# Patient Record
Sex: Male | Born: 1935 | Race: White | Hispanic: No | Marital: Married | Smoking: Former smoker
Health system: Southern US, Community
[De-identification: ages and names within clinical notes are randomized; demographics above are authoritative.]

## PROBLEM LIST (undated history)

## (undated) DIAGNOSIS — C679 Malignant neoplasm of bladder, unspecified: Secondary | ICD-10-CM

## (undated) DIAGNOSIS — R0602 Shortness of breath: Secondary | ICD-10-CM

## (undated) DIAGNOSIS — E785 Hyperlipidemia, unspecified: Secondary | ICD-10-CM

## (undated) DIAGNOSIS — I499 Cardiac arrhythmia, unspecified: Secondary | ICD-10-CM

## (undated) DIAGNOSIS — C4490 Unspecified malignant neoplasm of skin, unspecified: Secondary | ICD-10-CM

## (undated) DIAGNOSIS — Z87442 Personal history of urinary calculi: Secondary | ICD-10-CM

## (undated) HISTORY — DX: Cardiac arrhythmia, unspecified: I49.9

## (undated) HISTORY — PX: OTHER SURGICAL HISTORY: SHX169

## (undated) HISTORY — DX: Shortness of breath: R06.02

## (undated) HISTORY — DX: Hyperlipidemia, unspecified: E78.5

---

## 2000-01-16 ENCOUNTER — Encounter: Payer: Self-pay | Admitting: Emergency Medicine

## 2000-01-16 ENCOUNTER — Emergency Department (HOSPITAL_COMMUNITY): Admission: EM | Admit: 2000-01-16 | Discharge: 2000-01-16 | Payer: Self-pay | Admitting: Emergency Medicine

## 2010-02-23 ENCOUNTER — Ambulatory Visit: Payer: Self-pay | Admitting: Cardiovascular Disease

## 2010-05-26 ENCOUNTER — Ambulatory Visit (INDEPENDENT_AMBULATORY_CARE_PROVIDER_SITE_OTHER): Payer: Medicare Other | Admitting: Cardiovascular Disease

## 2010-05-26 DIAGNOSIS — E7800 Pure hypercholesterolemia, unspecified: Secondary | ICD-10-CM

## 2010-05-26 DIAGNOSIS — I119 Hypertensive heart disease without heart failure: Secondary | ICD-10-CM

## 2010-12-21 ENCOUNTER — Encounter: Payer: Self-pay | Admitting: Cardiovascular Disease

## 2010-12-23 ENCOUNTER — Encounter: Payer: Self-pay | Admitting: Cardiovascular Disease

## 2010-12-23 ENCOUNTER — Ambulatory Visit (INDEPENDENT_AMBULATORY_CARE_PROVIDER_SITE_OTHER): Payer: Medicare Other | Admitting: Cardiovascular Disease

## 2010-12-23 DIAGNOSIS — R000 Tachycardia, unspecified: Secondary | ICD-10-CM

## 2010-12-23 DIAGNOSIS — E785 Hyperlipidemia, unspecified: Secondary | ICD-10-CM | POA: Insufficient documentation

## 2010-12-23 LAB — BASIC METABOLIC PANEL
BUN: 22 mg/dL (ref 6–23)
CO2: 29 mEq/L (ref 19–32)
Calcium: 9.1 mg/dL (ref 8.4–10.5)
Chloride: 106 mEq/L (ref 96–112)
Creatinine, Ser: 1.4 mg/dL (ref 0.4–1.5)
GFR: 53.18 mL/min — ABNORMAL LOW (ref >60.00)
Glucose, Bld: 111 mg/dL — ABNORMAL HIGH (ref 70–99)
Potassium: 5 mEq/L (ref 3.5–5.1)
Sodium: 142 mEq/L (ref 135–145)

## 2010-12-23 LAB — LIPID PANEL
Cholesterol: 176 mg/dL (ref 0–200)
HDL: 39.3 mg/dL (ref >39.00)
LDL Cholesterol: 115 mg/dL — ABNORMAL HIGH (ref 0–99)
Total CHOL/HDL Ratio: 4
Triglycerides: 107 mg/dL (ref 0.0–149.0)
VLDL: 21.4 mg/dL (ref 0.0–40.0)

## 2010-12-23 LAB — HEPATIC FUNCTION PANEL
ALT: 23 U/L (ref 0–53)
AST: 28 U/L (ref 0–37)
Albumin: 4.1 g/dL (ref 3.5–5.2)
Alkaline Phosphatase: 43 U/L (ref 39–117)
Bilirubin, Direct: 0.1 mg/dL (ref 0.0–0.3)
Total Bilirubin: 0.6 mg/dL (ref 0.3–1.2)
Total Protein: 7.3 g/dL (ref 6.0–8.3)

## 2010-12-23 NOTE — Assessment & Plan Note (Signed)
His lipid levels have been fairly well-controlled. We'll check him again at his next office visit.

## 2010-12-23 NOTE — Assessment & Plan Note (Signed)
His heart rate and blood pressure likely very well controlled. I've encouraged him to try to get out and get some regular exercise. I'll see him again in one year.

## 2010-12-23 NOTE — Progress Notes (Signed)
Randall Little Date of Birth  Mar 30, 1933 [facility] [address]    [address] [place], [place]  [address] [phone]  Fax  [phone]  History of Present Illness:  76 year old gentleman with the history tachycardia and history of hyperlipidemia. He's done for well. He still is working every day. His work involves a lot of travel.  He does not get any regular aerobic exercise but does walk quite a bit.[name] He is job involves a lot of walking.  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   He's had a few episodes of shortness breath when he is exercising a lot but has never had episodes of chest pain  Current Outpatient Prescriptions on File Prior to Visit  Medication Sig Dispense Refill  . aspirin 81 MG tablet Take 81 mg by mouth daily.        . fenofibrate (TRICOR) 145 MG tablet Take 145 mg by mouth daily.        . metoprolol succinate (TOPROL-XL) 25 MG 24 hr tablet Take 25 mg by mouth daily.        . Multiple Vitamin (MULTI-VITAMIN PO) Take by mouth daily.        . niacin (NIASPAN) 500 MG CR tablet Take 500 mg by mouth at bedtime.          No Known Allergies  Past Medical History  Diagnosis Date  . Arrhythmia     Tachycardia  . SOB (shortness of breath)   . Dyslipidemia     No past surgical history on file.  History  Smoking status  . Former Smoker  Smokeless tobacco  . Not on file    History  Alcohol Use No    Family History  Problem Relation Age of Onset  . Heart disease Mother   . Cancer Sister   . Cancer Sister     Reviw of Systems:  Reviewed in the HPI.  All other systems are negative.  Physical Exam: BP 146/84  Pulse 78  Ht 5\' 10"  (1.778 m)  Wt 170 lb 6.4 oz (77.293 kg)  BMI 24.45 kg/m2 The patient is alert and oriented x 3.  The mood and affect are normal.   Skin: warm and dry.  Color is normal.    HEENT:   the sclera are nonicteric.  The mucous membranes are moist.  The carotids are 2+ without  bruits.  There is no thyromegaly.  There is no JVD.    Lungs: clear.  The chest wall is non tender.    Heart: regular rate with a normal S1 and S2.  There are no murmurs, gallops, or rubs. The PMI is not displaced.     Abdomen: good bowel sounds.  There is no guarding or rebound.  There is no hepatosplenomegaly or tenderness.  There are no masses.   Extremities:  no clubbing, cyanosis, or edema.  The legs are without rashes.  The distal pulses are intact.   Neuro:  Cranial nerves II - XII are intact.  Motor and sensory functions are intact.    The gait is normal.   Assessment / Plan:

## 2011-01-25 ENCOUNTER — Other Ambulatory Visit: Payer: Self-pay | Admitting: Cardiovascular Disease

## 2011-02-25 ENCOUNTER — Other Ambulatory Visit: Payer: Self-pay | Admitting: Cardiovascular Disease

## 2011-09-01 ENCOUNTER — Other Ambulatory Visit: Payer: Self-pay | Admitting: Cardiovascular Disease

## 2011-09-01 MED ORDER — NIACIN ER (ANTIHYPERLIPIDEMIC) 500 MG PO TBCR: 500.0000 mg | EXTENDED_RELEASE_TABLET | Freq: Every day | ORAL | Status: DC

## 2011-09-27 ENCOUNTER — Other Ambulatory Visit: Payer: Self-pay | Admitting: *Deleted

## 2011-09-27 MED ORDER — METOPROLOL SUCCINATE ER 50 MG PO TB24: 50.0000 mg | ORAL_TABLET | Freq: Every day | ORAL | Status: DC

## 2012-01-03 ENCOUNTER — Other Ambulatory Visit: Payer: Self-pay | Admitting: Cardiology

## 2012-01-10 NOTE — Telephone Encounter (Signed)
Follow-up:    Called to follow up on medication refill request of metoprolol succinate (TOPROL-XL) 50 MG 24 hr tablet.

## 2012-01-13 ENCOUNTER — Other Ambulatory Visit: Payer: Self-pay

## 2012-01-13 MED ORDER — METOPROLOL SUCCINATE ER 50 MG PO TB24: 50.0000 mg | ORAL_TABLET | Freq: Every day | ORAL | Status: DC

## 2012-02-15 ENCOUNTER — Other Ambulatory Visit: Payer: Self-pay | Admitting: *Deleted

## 2012-02-15 MED ORDER — METOPROLOL SUCCINATE ER 50 MG PO TB24: 50.0000 mg | ORAL_TABLET | Freq: Every day | ORAL | Status: DC

## 2012-02-15 NOTE — Telephone Encounter (Signed)
Pt needs appointment then refill can be made Fax Received. Refill Completed. Randall Little (R.M.A)   

## 2012-03-20 ENCOUNTER — Telehealth: Payer: Self-pay | Admitting: Cardiovascular Disease

## 2012-03-20 MED ORDER — METOPROLOL SUCCINATE ER 50 MG PO TB24: 50.0000 mg | ORAL_TABLET | Freq: Every day | ORAL | Status: DC

## 2012-03-20 NOTE — Telephone Encounter (Signed)
plz return call to pt on cell [phone] regarding temp refill until 03/24/12 med refill appnt

## 2012-03-20 NOTE — Telephone Encounter (Signed)
Fax Received. Refill Completed. Randall Little (R.M.A). No Refills until after appointment

## 2012-03-24 ENCOUNTER — Ambulatory Visit (INDEPENDENT_AMBULATORY_CARE_PROVIDER_SITE_OTHER): Payer: Medicare Other | Admitting: Physician Assistant

## 2012-03-24 ENCOUNTER — Encounter: Payer: Self-pay | Admitting: Physician Assistant

## 2012-03-24 VITALS — BP 140/86 | HR 73 | Ht 70.0 in | Wt 168.1 lb

## 2012-03-24 DIAGNOSIS — R000 Tachycardia, unspecified: Secondary | ICD-10-CM

## 2012-03-24 DIAGNOSIS — E785 Hyperlipidemia, unspecified: Secondary | ICD-10-CM

## 2012-03-24 MED ORDER — FENOFIBRATE 145 MG PO TABS: 145.0000 mg | ORAL_TABLET | Freq: Every day | ORAL | Status: DC

## 2012-03-24 NOTE — Progress Notes (Signed)
  [address], [address] [place], [place]  [address] Phone: [phone], Fax:  [phone]  Date:  03/24/2012   Name:  Randall Little   DOB:  December 25, 1932   MRN:  [id number]  PCP:  Kaleen Mask, MD  Primary Cardiologist:  Dr. Delane Ginger  Primary Electrophysiologist:  None    History of Present Illness: Randall Little is a 78 y.o. male who follows with Dr. Delane Ginger for a hx of tachycardia and HL.  No echocardiogram or stress tests found in review of his chart.  He is here for annual follow up.  The patient denies chest pain, shortness of breath, syncope, orthopnea, PND or significant pedal edema.   Labs (9/12):   K 5, creatinine 1.4, ALT 23, LDL 115, TG 107, HDL 39.3    Wt Readings from Last 3 Encounters:  03/24/12 168 lb 1.9 oz (76.259 kg)  12/23/10 170 lb 6.4 oz (77.293 kg)  05/26/10 168 lb 12.8 oz (76.567 kg)     Past Medical History  Diagnosis Date  . Arrhythmia     Tachycardia  . SOB (shortness of breath)   . Dyslipidemia     Current Outpatient Prescriptions  Medication Sig Dispense Refill  . aspirin 81 MG tablet Take 81 mg by mouth daily.        . fenofibrate (TRICOR) 145 MG tablet Take 145 mg by mouth daily.        . metoprolol succinate (TOPROL-XL) 50 MG 24 hr tablet Take 1 tablet (50 mg total) by mouth daily. Take with or immediately following a meal.  30 tablet  0  . Multiple Vitamin (MULTI-VITAMIN PO) Take by mouth daily.        . niacin (NIASPAN) 500 MG CR tablet Take 1 tablet (500 mg total) by mouth at bedtime.  30 tablet  5    Allergies:   No Known Allergies  Social History:  The patient  reports that he has quit smoking. He does not have any smokeless tobacco history on file. He reports that he does not drink alcohol.   ROS:  Please see the history of present illness.   All other systems reviewed and negative.   PHYSICAL EXAM: VS:  BP 140/86  Pulse 73  Ht 5\' 10"  (1.778 m)  Wt 168 lb 1.9 oz (76.259 kg)  BMI 24.12 kg/m2 Well  nourished, well developed, in no acute distress HEENT: normal Neck: no JVD Vascular:  No carotid bruits Cardiac:  normal S1, S2; RRR; no murmur Lungs:  clear to auscultation bilaterally, no wheezing, rhonchi or rales Abd: soft, nontender, no hepatomegaly Ext: no edema Skin: warm and dry Neuro:  CNs 2-12 intact, no focal abnormalities noted  EKG:  NSR, HR 73, normal axis, NSSTTW changes      ASSESSMENT AND PLAN:  1. Tachycardia:   Appears to be controlled.  Continue current Rx.  2. Hyperlipidemia:   Continue Tricor and Niacin.  Arrange follow up Lipids, LFTs, BMET.  3. Disposition:   Follow up with Dr. Delane Ginger in 1 year or sooner PRN.  Signed, Tereso Newcomer, PA-C  9:14 AM 03/24/2012

## 2012-03-24 NOTE — Patient Instructions (Addendum)
Your physician wants you to follow-up in: 55 MONTH. You will receive a reminder letter in the mail two months in advance. If you don't receive a letter, please call our office to schedule the follow-up appointment.   Your physician recommends that you return for lab work in: FASTING LIPID AND LIVER PANEL ALSO A BMET   A REFILL FOR TRICOR WAS SENT TO [facility] [place]

## 2012-04-26 ENCOUNTER — Other Ambulatory Visit: Payer: Self-pay | Admitting: *Deleted

## 2012-04-26 MED ORDER — METOPROLOL SUCCINATE ER 50 MG PO TB24: 50.0000 mg | ORAL_TABLET | Freq: Every day | ORAL | Status: DC

## 2012-04-26 NOTE — Telephone Encounter (Signed)
Fax Received. Refill Completed. Neeti Knudtson Chowoe (R.M.A)   

## 2012-10-09 ENCOUNTER — Other Ambulatory Visit: Payer: Self-pay | Admitting: Family Medicine

## 2012-10-09 DIAGNOSIS — Q000 Anencephaly: Secondary | ICD-10-CM

## 2012-10-12 ENCOUNTER — Ambulatory Visit
Admission: RE | Admit: 2012-10-12 | Discharge: 2012-10-12 | Disposition: A | Discharge status: Home / Self Care | Payer: Medicare Other | Source: Ambulatory Visit | Attending: Family Medicine | Admitting: Family Medicine

## 2012-10-12 DIAGNOSIS — Q000 Anencephaly: Secondary | ICD-10-CM

## 2012-12-20 ENCOUNTER — Other Ambulatory Visit: Payer: Self-pay | Admitting: Cardiovascular Disease

## 2013-05-02 ENCOUNTER — Other Ambulatory Visit: Payer: Self-pay | Admitting: Cardiovascular Disease

## 2013-05-23 ENCOUNTER — Encounter: Payer: Self-pay | Admitting: Cardiovascular Disease

## 2013-05-31 ENCOUNTER — Ambulatory Visit: Payer: Medicare Other | Admitting: Cardiovascular Disease

## 2013-06-05 ENCOUNTER — Other Ambulatory Visit: Payer: Self-pay | Admitting: Cardiovascular Disease

## 2013-06-27 ENCOUNTER — Ambulatory Visit (INDEPENDENT_AMBULATORY_CARE_PROVIDER_SITE_OTHER): Payer: Medicare Other | Admitting: Cardiovascular Disease

## 2013-06-27 ENCOUNTER — Encounter: Payer: Self-pay | Admitting: Cardiovascular Disease

## 2013-06-27 VITALS — BP 130/74 | HR 82 | Ht 70.0 in | Wt 171.1 lb

## 2013-06-27 DIAGNOSIS — Z0000 Encounter for general adult medical examination without abnormal findings: Secondary | ICD-10-CM

## 2013-06-27 DIAGNOSIS — R000 Tachycardia, unspecified: Secondary | ICD-10-CM

## 2013-06-27 DIAGNOSIS — C61 Malignant neoplasm of prostate: Secondary | ICD-10-CM

## 2013-06-27 DIAGNOSIS — E785 Hyperlipidemia, unspecified: Secondary | ICD-10-CM

## 2013-06-27 LAB — LIPID PANEL
Cholesterol: 148 mg/dL (ref 0–200)
HDL: 34 mg/dL — ABNORMAL LOW (ref >39.00)
LDL Cholesterol: 87 mg/dL (ref 0–99)
Total CHOL/HDL Ratio: 4
Triglycerides: 134 mg/dL (ref 0.0–149.0)
VLDL: 26.8 mg/dL (ref 0.0–40.0)

## 2013-06-27 LAB — HEPATIC FUNCTION PANEL
ALT: 25 U/L (ref 0–53)
AST: 25 U/L (ref 0–37)
Albumin: 3.6 g/dL (ref 3.5–5.2)
Alkaline Phosphatase: 69 U/L (ref 39–117)
BILIRUBIN DIRECT: 0.1 mg/dL (ref 0.0–0.3)
TOTAL PROTEIN: 6.5 g/dL (ref 6.0–8.3)
Total Bilirubin: 0.7 mg/dL (ref 0.3–1.2)

## 2013-06-27 LAB — BASIC METABOLIC PANEL
BUN: 20 mg/dL (ref 6–23)
CO2: 28 mEq/L (ref 19–32)
Calcium: 9.3 mg/dL (ref 8.4–10.5)
Chloride: 105 mEq/L (ref 96–112)
Creatinine, Ser: 1.3 mg/dL (ref 0.4–1.5)
GFR: 57.63 mL/min — ABNORMAL LOW (ref >60.00)
GLUCOSE: 116 mg/dL — AB (ref 70–99)
POTASSIUM: 4.2 meq/L (ref 3.5–5.1)
Sodium: 140 mEq/L (ref 135–145)

## 2013-06-27 LAB — PSA: PSA: 12.68 ng/mL — ABNORMAL HIGH (ref 0.10–4.00)

## 2013-06-27 MED ORDER — METOPROLOL SUCCINATE ER 50 MG PO TB24: ORAL_TABLET | ORAL | Status: DC

## 2013-06-27 MED ORDER — FENOFIBRATE 145 MG PO TABS: 145.0000 mg | ORAL_TABLET | Freq: Every day | ORAL | Status: DC

## 2013-06-27 NOTE — Assessment & Plan Note (Signed)
Randall Little is doing well. His tachycardia has been well-controlled with his metoprolol. Continue same medications.

## 2013-06-27 NOTE — Assessment & Plan Note (Signed)
He continues on fenofibrate 145 mg a day. We'll recheck fasting labs today.

## 2013-06-27 NOTE — Patient Instructions (Addendum)
Your physician recommends that you return for lab work in: TODAY PSA LIPID LIVER BMET AND IN 1 YEAR   Your physician wants you to follow-up in:1 YEAR  You will receive a reminder letter in the mail two months in advance. If you don't receive a letter, please call our office to schedule the follow-up appointment.

## 2013-06-27 NOTE — Progress Notes (Signed)
Randall Little Date of Birth  [birth year] [facility] [address]    [place], [place]  [address] [phone]  Fax  [phone]  Problem list:  1. History of hyperlipidemia 2. Tachycardia 3. Prostate Cancer   History of Present Illness:  80 year old gentleman with the history tachycardia and history of hyperlipidemia. He's done for well. He still is working every day. His work involves a lot of travel.  He does not get any regular aerobic exercise but does walk quite a bit.[name] He is job involves a lot of walking.  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   He's had a few episodes of shortness breath when he is exercising a lot but has never had episodes of chest pain.  06/27/2013  Randall Little is doing well.   He works 5-6 days a week.   He walks during work all day - does not get regular exercise.    Current Outpatient Prescriptions on File Prior to Visit  Medication Sig Dispense Refill  . aspirin 81 MG tablet Take 81 mg by mouth daily.        . metoprolol succinate (TOPROL-XL) 50 MG 24 hr tablet TAKE 1 TABLET BY MOUTH EVERY DAY *TAKE WITH OR IMMEDIATELY FOLLOWING A MEAL*  30 tablet  0   No current facility-administered medications on file prior to visit.    No Known Allergies  Past Medical History  Diagnosis Date  . Arrhythmia     Tachycardia  . SOB (shortness of breath)   . Dyslipidemia     No past surgical history on file.  History  Smoking status  . Former Smoker  Smokeless tobacco  . Not on file    History  Alcohol Use No    Family History  Problem Relation Age of Onset  . Heart disease Mother   . Cancer Sister   . Cancer Sister     Reviw of Systems:  Reviewed in the HPI.  All other systems are negative.  Physical Exam: BP 130/74  Pulse 82  Ht 5\' 10"  (1.778 m)  Wt 171 lb 1.9 oz (77.62 kg)  BMI 24.55 kg/m2 The patient is alert and oriented x 3.  The mood and affect are normal.   Skin: warm  and dry.  Color is normal.   HEENT:   the sclera are nonicteric.  The mucous membranes are moist.  The carotids are 2+ without bruits.  There is no thyromegaly.  There is no JVD.   Lungs: clear.  The chest wall is non tender.   Heart: regular rate with a normal S1 and S2.  There are no murmurs, gallops, or rubs. The PMI is not displaced.    Abdomen: good bowel sounds.  There is no guarding or rebound.  There is no hepatosplenomegaly or tenderness.  There are no masses.  Extremities:  no clubbing, cyanosis, or edema.  The legs are without rashes.  The distal pulses are intact.  Neuro:  Cranial nerves II - XII are intact.  Motor and sensory functions are intact.   The gait is normal.   Assessment / Plan:

## 2014-06-25 ENCOUNTER — Other Ambulatory Visit: Payer: Self-pay | Admitting: Cardiovascular Disease

## 2014-06-28 ENCOUNTER — Encounter: Payer: Self-pay | Admitting: Cardiovascular Disease

## 2014-06-28 ENCOUNTER — Ambulatory Visit (INDEPENDENT_AMBULATORY_CARE_PROVIDER_SITE_OTHER): Payer: Medicare Other | Admitting: Cardiovascular Disease

## 2014-06-28 VITALS — BP 128/80 | HR 69 | Ht 70.0 in | Wt 167.4 lb

## 2014-06-28 DIAGNOSIS — R000 Tachycardia, unspecified: Secondary | ICD-10-CM | POA: Diagnosis not present

## 2014-06-28 DIAGNOSIS — E785 Hyperlipidemia, unspecified: Secondary | ICD-10-CM | POA: Diagnosis not present

## 2014-06-28 MED ORDER — METOPROLOL SUCCINATE ER 50 MG PO TB24: 50.0000 mg | ORAL_TABLET | Freq: Every day | ORAL | Status: DC

## 2014-06-28 MED ORDER — FENOFIBRATE 145 MG PO TABS: 145.0000 mg | ORAL_TABLET | Freq: Every day | ORAL | Status: DC

## 2014-06-28 NOTE — Progress Notes (Signed)
Cardiology Office Note   Date:  06/28/2014   ID:  Randall Little, DOB 1932/11/16, MRN [id number]  PCP:  Leonard Downing, MD  Cardiologist:   Thayer Headings, MD   Chief Complaint  Patient presents with  . Hypertension   1. History of hyperlipidemia 2. Tachycardia 3. Prostate Cancer   History of Present Illness:  82 year old gentleman with the history tachycardia and history of hyperlipidemia. He's done for well. He still is working every day. His work involves a lot of travel. He does not get any regular aerobic exercise but does walk quite a bit.Randall Little He is job involves a lot of walking. 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 He's had a few episodes of shortness breath when he is exercising a lot but has never had episodes of chest pain.  06/27/2013  Randall Little is doing well. He works 5-6 days a week. He walks during work all day - does not get regular exercise.    History of Present Illness: Randall Little is a 81 y.o. male who presents for evaluation of his HTN. Still very active. Rides his stationary bike. Walks about 5 miles a day in the coarse of his work      Past Medical History  Diagnosis Date  . Arrhythmia     Tachycardia  . SOB (shortness of breath)   . Dyslipidemia     No past surgical history on file.   Current Outpatient Prescriptions  Medication Sig Dispense Refill  . aspirin 81 MG tablet Take 81 mg by mouth daily.      . fenofibrate (TRICOR) 145 MG tablet TAKE 1 TABLET BY MOUTH EVERY DAY 30 tablet 0  . metoprolol succinate (TOPROL-XL) 50 MG 24 hr tablet TAKE 1 TABLET BY MOUTH EVERY DAY *TAKE WITH OR IMMEDIATELY FOLLOWING A MEAL* 30 tablet 0   No current facility-administered medications for this visit.    Allergies:   Review of patient's allergies indicates no known allergies.    Social History:  The patient  reports that he has quit smoking. He does not have any smokeless tobacco history  on file. He reports that he does not drink alcohol.   Family History:  The patient's family history includes Cancer in his sister and sister; Heart disease in his mother.    ROS:  Please see the history of present illness.    Review of Systems: Constitutional:  denies fever, chills, diaphoresis, appetite change and fatigue.  HEENT: denies photophobia, eye pain, redness, hearing loss, ear pain, congestion, sore throat, rhinorrhea, sneezing, neck pain, neck stiffness and tinnitus.  Respiratory: denies SOB, DOE, cough, chest tightness, and wheezing.  Cardiovascular: denies chest pain, palpitations and leg swelling.  Gastrointestinal: denies nausea, vomiting, abdominal pain, diarrhea, constipation, blood in stool.  Genitourinary: denies dysuria, urgency, frequency, hematuria, flank pain and difficulty urinating.  Musculoskeletal: denies  myalgias, back pain, joint swelling, arthralgias and gait problem.   Skin: denies pallor, rash and wound.  Neurological: denies dizziness, seizures, syncope, weakness, light-headedness, numbness and headaches.   Hematological: denies adenopathy, easy bruising, personal or family bleeding history.  Psychiatric/ Behavioral: denies suicidal ideation, mood changes, confusion, nervousness, sleep disturbance and agitation.       All other systems are reviewed and negative.    PHYSICAL EXAM: VS:  BP 128/80 mmHg  Pulse 69  Ht 5\' 10"  (1.778 m)  Wt 167 lb 6.4 oz (75.932 kg)  BMI 24.02 kg/m2 , BMI Body mass index  is 24.02 kg/(m^2). GEN: Well nourished, well developed, in no acute distress HEENT: normal Neck: no JVD, carotid bruits, or masses Cardiac: RRR; no murmurs, rubs, or gallops,no edema  Respiratory:  clear to auscultation bilaterally, normal work of breathing GI: soft, nontender, nondistended, + BS MS: no deformity or atrophy Skin: warm and dry, no rash Neuro:  Strength and sensation are intact Psych: normal   EKG:  EKG is ordered today. The ekg  ordered today demonstrates NSR at 69.  No ST or T wave changes.    Recent Labs: No results found for requested labs within last 365 days.    Lipid Panel    Component Value Date/Time   CHOL 148 06/27/2013 1020   TRIG 134.0 06/27/2013 1020   HDL 34.00* 06/27/2013 1020   CHOLHDL 4 06/27/2013 1020   VLDL 26.8 06/27/2013 1020   LDLCALC 87 06/27/2013 1020      Wt Readings from Last 3 Encounters:  06/28/14 167 lb 6.4 oz (75.932 kg)  06/27/13 171 lb 1.9 oz (77.62 kg)  03/24/12 168 lb 1.9 oz (76.259 kg)      Other studies Reviewed: Additional studies/ records that were reviewed today include: . Review of the above records demonstrates:    ASSESSMENT AND PLAN:  1. History of hyperlipidemia - continue fenofibrate.  Lipids were checked at his medical doctors office.  He will get [place] the results.   2. Tachycardia- resolved.  3. Prostate Cancer - followed by Dr. [name].   Current medicines are reviewed at length with the patient today.  The patient does not have concerns regarding medicines.  The following changes have been made:  no change  Labs/ tests ordered today include:  No orders of the defined types were placed in this encounter.     Disposition:   FU with me in 1 year    Signed, Nahser, Wonda Cheng, MD  06/28/2014 2:12 PM    [facility] [place], [place], [place]  [address] Phone: [phone]; Fax: [phone]

## 2014-06-28 NOTE — Patient Instructions (Signed)
Your physician recommends that you continue on your current medications as directed. Please refer to the Current Medication list given to you today.  Your physician wants you to follow-up in: 1 year with Dr. Acie Fredrickson.  You will receive a reminder letter in the mail two months in advance. If you don't receive a letter, please call our office to schedule the follow-up appointment. Your physician recommends that you return for lab work in: 1 year on the day of or a few days before your office visit with Dr. Acie Fredrickson.  You will need to FAST for this appointment - nothing to eat or drink after midnight the night before except water.

## 2014-07-01 ENCOUNTER — Encounter: Payer: Self-pay | Admitting: Cardiovascular Disease

## 2015-06-05 ENCOUNTER — Ambulatory Visit (INDEPENDENT_AMBULATORY_CARE_PROVIDER_SITE_OTHER): Payer: Medicare Other | Admitting: Cardiovascular Disease

## 2015-06-05 ENCOUNTER — Other Ambulatory Visit (INDEPENDENT_AMBULATORY_CARE_PROVIDER_SITE_OTHER): Payer: Medicare Other | Admitting: *Deleted

## 2015-06-05 ENCOUNTER — Encounter: Payer: Self-pay | Admitting: Cardiovascular Disease

## 2015-06-05 VITALS — BP 122/76 | HR 68 | Ht 70.0 in | Wt 164.1 lb

## 2015-06-05 DIAGNOSIS — R000 Tachycardia, unspecified: Secondary | ICD-10-CM | POA: Diagnosis not present

## 2015-06-05 DIAGNOSIS — E785 Hyperlipidemia, unspecified: Secondary | ICD-10-CM | POA: Diagnosis not present

## 2015-06-05 LAB — HEPATIC FUNCTION PANEL
ALBUMIN: 3.8 g/dL (ref 3.6–5.1)
ALT: 21 U/L (ref 9–46)
AST: 26 U/L (ref 10–35)
Alkaline Phosphatase: 38 U/L — ABNORMAL LOW (ref 40–115)
Bilirubin, Direct: 0.1 mg/dL (ref <=0.2)
Indirect Bilirubin: 0.4 mg/dL (ref 0.2–1.2)
Total Bilirubin: 0.5 mg/dL (ref 0.2–1.2)
Total Protein: 6.6 g/dL (ref 6.1–8.1)

## 2015-06-05 LAB — BASIC METABOLIC PANEL
BUN: 26 mg/dL — ABNORMAL HIGH (ref 7–25)
CALCIUM: 9.2 mg/dL (ref 8.6–10.3)
CO2: 29 mmol/L (ref 20–31)
Chloride: 106 mmol/L (ref 98–110)
Creat: 1.42 mg/dL — ABNORMAL HIGH (ref 0.70–1.11)
Glucose, Bld: 106 mg/dL — ABNORMAL HIGH (ref 65–99)
Potassium: 4.5 mmol/L (ref 3.5–5.3)
SODIUM: 141 mmol/L (ref 135–146)

## 2015-06-05 LAB — LIPID PANEL
CHOL/HDL RATIO: 4.3 ratio (ref <=5.0)
CHOLESTEROL: 138 mg/dL (ref 125–200)
HDL: 32 mg/dL — AB (ref >=40)
LDL Cholesterol: 88 mg/dL (ref <130)
TRIGLYCERIDES: 92 mg/dL (ref <150)
VLDL: 18 mg/dL (ref <30)

## 2015-06-05 NOTE — Patient Instructions (Signed)
Medication Instructions:  Your physician recommends that you continue on your current medications as directed. Please refer to the Current Medication list given to you today.   Labwork: We will call you with the results   Testing/Procedures: None Ordered   Follow-Up: Your physician wants you to follow-up in: 1 year with Dr. Acie Fredrickson.  You will receive a reminder letter in the mail two months in advance. If you don't receive a letter, please call our office to schedule the follow-up appointment.   If you need a refill on your cardiac medications before your next appointment, please call your pharmacy.   Thank you for choosing [facility]! Christen Bame, RN [phone]

## 2015-06-05 NOTE — Progress Notes (Signed)
Cardiology Office Note   Date:  06/05/2015   ID:  Randall Little, DOB 03-12-1933, MRN [id number]  PCP:  Leonard Downing, MD  Cardiologist:   Thayer Headings, MD   Chief Complaint  Patient presents with  . Hyperlipidemia   1. History of hyperlipidemia 2. Tachycardia 3. Prostate Cancer   History of Present Illness:  82 year old gentleman with the history tachycardia and history of hyperlipidemia. He's done for well. He still is working every day. His work involves a lot of travel. He does not get any regular aerobic exercise but does walk quite a bit.[name] He is job involves a lot of walking. 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 He's had a few episodes of shortness breath when he is exercising a lot but has never had episodes of chest pain.  06/27/2013  Randall Little is doing well. He works 5-6 days a week. He walks during work all day - does not get regular exercise.    History of Present Illness: Randall Little is a 82 y.o. male who presents for evaluation of his HTN. Still very active. Rides his stationary bike. Walks about 5 miles a day in the coarse of his work   June 05, 2015:  Doing great. 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  No CP , no dyspnea.    Past Medical History  Diagnosis Date  . Arrhythmia     Tachycardia  . SOB (shortness of breath)   . Dyslipidemia     No past surgical history on file.   Current Outpatient Prescriptions  Medication Sig Dispense Refill  . aspirin 81 MG tablet Take 81 mg by mouth daily.      . fenofibrate (TRICOR) 145 MG tablet Take 1 tablet (145 mg total) by mouth daily. 90 tablet 3  . metoprolol succinate (TOPROL-XL) 50 MG 24 hr tablet Take 1 tablet (50 mg total) by mouth daily. Take with or immediately following a meal. 90 tablet 3   No current facility-administered medications for this visit.    Allergies:   Review of patient's allergies indicates  no known allergies.    Social History:  The patient  reports that he has quit smoking. He does not have any smokeless tobacco history on file. He reports that he does not drink alcohol.   Family History:  The patient's family history includes Cancer in his sister and sister; Heart disease in his mother.    ROS:  Please see the history of present illness.    Review of Systems: Constitutional:  denies fever, chills, diaphoresis, appetite change and fatigue.  HEENT: denies photophobia, eye pain, redness, hearing loss, ear pain, congestion, sore throat, rhinorrhea, sneezing, neck pain, neck stiffness and tinnitus.  Respiratory: denies SOB, DOE, cough, chest tightness, and wheezing.  Cardiovascular: denies chest pain, palpitations and leg swelling.  Gastrointestinal: denies nausea, vomiting, abdominal pain, diarrhea, constipation, blood in stool.  Genitourinary: denies dysuria, urgency, frequency, hematuria, flank pain and difficulty urinating.  Musculoskeletal: denies  myalgias, back pain, joint swelling, arthralgias and gait problem.   Skin: denies pallor, rash and wound.  Neurological: denies dizziness, seizures, syncope, weakness, light-headedness, numbness and headaches.   Hematological: denies adenopathy, easy bruising, personal or family bleeding history.  Psychiatric/ Behavioral: denies suicidal ideation, mood changes, confusion, nervousness, sleep disturbance and agitation.       All other systems are reviewed and negative.    PHYSICAL EXAM: VS:  BP 122/76  mmHg  Pulse 68  Ht 5\' 10"  (1.778 m)  Wt 164 lb 1.9 oz (74.444 kg)  BMI 23.55 kg/m2 , BMI Body mass index is 23.55 kg/(m^2). GEN: Well nourished, well developed, in no acute distress HEENT: normal Neck: no JVD, carotid bruits, or masses Cardiac: RRR; no murmurs, rubs, or gallops,no edema  Respiratory:  clear to auscultation bilaterally, normal work of breathing GI: soft, nontender, nondistended, + BS MS: no deformity or  atrophy Skin: warm and dry, no rash Neuro:  Strength and sensation are intact Psych: normal   EKG:  EKG is ordered today. The ekg ordered today demonstrates NSR at 69.  No ST or T wave changes.    Recent Labs: No results found for requested labs within last 365 days.    Lipid Panel    Component Value Date/Time   CHOL 148 06/27/2013 1020   TRIG 134.0 06/27/2013 1020   HDL 34.00* 06/27/2013 1020   CHOLHDL 4 06/27/2013 1020   VLDL 26.8 06/27/2013 1020   LDLCALC 87 06/27/2013 1020      Wt Readings from Last 3 Encounters:  06/05/15 164 lb 1.9 oz (74.444 kg)  06/28/14 167 lb 6.4 oz (75.932 kg)  06/27/13 171 lb 1.9 oz (77.62 kg)      Other studies Reviewed: Additional studies/ records that were reviewed today include: . Review of the above records demonstrates:    ASSESSMENT AND PLAN:  1. History of hyperlipidemia - continue fenofibrate.  Will check labs today.  Will see him in 1 year   2. Tachycardia- resolved.  3. Prostate Cancer - followed by Dr. [name].   Current medicines are reviewed at length with the patient today.  The patient does not have concerns regarding medicines.  The following changes have been made:  no change  Labs/ tests ordered today include:  No orders of the defined types were placed in this encounter.     Disposition:   FU with me in 1 year    Signed, Gissell Barra, Wonda Cheng, MD  06/05/2015 8:30 AM    [facility], [place], [place]  [address] Phone: [phone]; Fax: [phone]

## 2015-06-05 NOTE — Addendum Note (Signed)
Addended by: Eulis Foster on: 06/05/2015 08:11 AM   Modules accepted: Orders

## 2015-06-05 NOTE — Addendum Note (Signed)
Addended by: Eulis Foster on: 06/05/2015 08:03 AM   Modules accepted: Orders

## 2015-06-27 ENCOUNTER — Other Ambulatory Visit: Payer: Self-pay | Admitting: Cardiovascular Disease

## 2015-07-04 ENCOUNTER — Other Ambulatory Visit: Payer: Medicare Other

## 2015-07-04 ENCOUNTER — Ambulatory Visit: Payer: Medicare Other | Admitting: Cardiovascular Disease

## 2015-07-10 ENCOUNTER — Other Ambulatory Visit: Payer: Self-pay | Admitting: *Deleted

## 2015-07-10 ENCOUNTER — Telehealth: Payer: Self-pay | Admitting: Cardiovascular Disease

## 2015-07-10 MED ORDER — METOPROLOL SUCCINATE ER 50 MG PO TB24: 50.0000 mg | ORAL_TABLET | Freq: Every day | ORAL | Status: DC

## 2015-07-10 NOTE — Telephone Encounter (Signed)
Per pharmacy, they had the wrong fax number for our office. Rx sent.

## 2015-07-10 NOTE — Telephone Encounter (Signed)
New message    *STAT* If patient is at the pharmacy, call can be transferred to refill team.   1. Which medications need to be refilled? (please list name of each medication and dose if known) metoprolol succinate (TOPROL-XL) 50 MG 24 hr tablet  2. Which pharmacy/location (including street and city if local pharmacy) is medication to be sent to? [facility]  3. Do they need a 30 day or 90 day supply? 68   Pt states he dropped the script off last week and they are stating that they cant fill it because our office hasnt released it. Pt states that he has to drive 30 miles to get it and it is very large inconvenience. Please assist    Pt states that he hasn't had his medication in 10days

## 2015-09-29 ENCOUNTER — Other Ambulatory Visit: Payer: Self-pay | Admitting: Family Medicine

## 2015-09-29 DIAGNOSIS — M545 Low back pain: Secondary | ICD-10-CM

## 2016-02-02 ENCOUNTER — Other Ambulatory Visit: Payer: Self-pay | Admitting: Family Medicine

## 2016-02-02 DIAGNOSIS — M79601 Pain in right arm: Secondary | ICD-10-CM

## 2016-02-05 ENCOUNTER — Other Ambulatory Visit: Payer: Self-pay | Admitting: Family Medicine

## 2016-02-05 DIAGNOSIS — M79602 Pain in left arm: Secondary | ICD-10-CM

## 2016-02-06 ENCOUNTER — Ambulatory Visit
Admission: RE | Admit: 2016-02-06 | Discharge: 2016-02-06 | Disposition: A | Discharge status: Home / Self Care | Payer: Medicare Other | Source: Ambulatory Visit | Attending: Family Medicine | Admitting: Family Medicine

## 2016-02-06 DIAGNOSIS — M79602 Pain in left arm: Secondary | ICD-10-CM

## 2016-02-09 ENCOUNTER — Other Ambulatory Visit: Payer: Self-pay | Admitting: Family Medicine

## 2016-02-09 DIAGNOSIS — R609 Edema, unspecified: Secondary | ICD-10-CM

## 2016-02-12 ENCOUNTER — Other Ambulatory Visit: Payer: Self-pay | Admitting: Family Medicine

## 2016-02-12 ENCOUNTER — Ambulatory Visit
Admission: RE | Admit: 2016-02-12 | Discharge: 2016-02-12 | Disposition: A | Discharge status: Home / Self Care | Payer: Medicare Other | Source: Ambulatory Visit | Attending: Family Medicine | Admitting: Family Medicine

## 2016-02-12 DIAGNOSIS — R609 Edema, unspecified: Secondary | ICD-10-CM

## 2016-02-12 MED ORDER — IOPAMIDOL (ISOVUE-300) INJECTION 61%
65.0000 mL | Freq: Once | INTRAVENOUS | Status: AC | PRN
  Administered 2016-02-12: 65 mL via INTRAVENOUS

## 2016-04-21 ENCOUNTER — Ambulatory Visit (INDEPENDENT_AMBULATORY_CARE_PROVIDER_SITE_OTHER): Payer: Self-pay | Admitting: Orthopaedic Surgery

## 2016-07-30 ENCOUNTER — Other Ambulatory Visit: Payer: Self-pay | Admitting: Cardiovascular Disease

## 2016-08-05 ENCOUNTER — Ambulatory Visit: Payer: Medicare Other | Admitting: Cardiovascular Disease

## 2016-08-09 ENCOUNTER — Encounter: Payer: Self-pay | Admitting: Cardiovascular Disease

## 2016-08-09 ENCOUNTER — Encounter (INDEPENDENT_AMBULATORY_CARE_PROVIDER_SITE_OTHER): Payer: Self-pay

## 2016-08-09 ENCOUNTER — Ambulatory Visit (INDEPENDENT_AMBULATORY_CARE_PROVIDER_SITE_OTHER): Payer: Medicare Other | Admitting: Cardiovascular Disease

## 2016-08-09 VITALS — BP 130/75 | HR 74 | Ht 71.0 in | Wt 163.4 lb

## 2016-08-09 DIAGNOSIS — E782 Mixed hyperlipidemia: Secondary | ICD-10-CM

## 2016-08-09 DIAGNOSIS — I10 Essential (primary) hypertension: Secondary | ICD-10-CM | POA: Diagnosis not present

## 2016-08-09 LAB — COMPREHENSIVE METABOLIC PANEL
ALK PHOS: 79 IU/L (ref 39–117)
ALT: 20 IU/L (ref 0–44)
AST: 26 IU/L (ref 0–40)
Albumin/Globulin Ratio: 1.4 (ref 1.2–2.2)
Albumin: 3.7 g/dL (ref 3.5–4.7)
BUN/Creatinine Ratio: 17 (ref 10–24)
BUN: 19 mg/dL (ref 8–27)
Bilirubin Total: 0.5 mg/dL (ref 0.0–1.2)
CO2: 27 mmol/L (ref 18–29)
CREATININE: 1.13 mg/dL (ref 0.76–1.27)
Calcium: 9.1 mg/dL (ref 8.6–10.2)
Chloride: 103 mmol/L (ref 96–106)
GFR calc Af Amer: 70 mL/min/{1.73_m2} (ref >59)
GFR calc non Af Amer: 60 mL/min/{1.73_m2} (ref >59)
GLOBULIN, TOTAL: 2.7 g/dL (ref 1.5–4.5)
GLUCOSE: 98 mg/dL (ref 65–99)
Potassium: 4.5 mmol/L (ref 3.5–5.2)
SODIUM: 142 mmol/L (ref 134–144)
Total Protein: 6.4 g/dL (ref 6.0–8.5)

## 2016-08-09 LAB — LIPID PANEL
Chol/HDL Ratio: 5.1 ratio — ABNORMAL HIGH (ref 0.0–5.0)
Cholesterol, Total: 179 mg/dL (ref 100–199)
HDL: 35 mg/dL — ABNORMAL LOW (ref >39)
LDL CALC: 113 mg/dL — AB (ref 0–99)
Triglycerides: 153 mg/dL — ABNORMAL HIGH (ref 0–149)
VLDL CHOLESTEROL CAL: 31 mg/dL (ref 5–40)

## 2016-08-09 NOTE — Progress Notes (Signed)
Cardiology Office Note   Date:  08/09/2016   ID:  Randall Little, DOB 09/07/1932, MRN [id number]  PCP:  Leonard Downing, MD  Cardiologist:   Mertie Moores, MD   No chief complaint on file.  1. History of hyperlipidemia 2. Tachycardia 3. Prostate Cancer   History of Present Illness:  83 year old gentleman with the history tachycardia and history of hyperlipidemia. He's done for well. He still is working every day. His work involves a lot of travel. He does not get any regular aerobic exercise but does walk quite a bit.Randall Little He is job involves a lot of walking. 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 He's had a few episodes of shortness breath when he is exercising a lot but has never had episodes of chest pain.  06/27/2013  Randall Little is doing well. He works 5-6 days a week. He walks during work all day - does not get regular exercise.    History of Present Illness: Randall Little is a 83 y.o. male who presents for evaluation of his HTN. Still very active. Rides his stationary bike. Walks about 5 miles a day in the coarse of his work   June 05, 2015:  Doing great. 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  No CP , no dyspnea.   Aug 09, 2016:  Randall Little is doing well.   Walks lots during his work day .   Past Medical History:  Diagnosis Date  . Arrhythmia    Tachycardia  . Dyslipidemia   . SOB (shortness of breath)     No past surgical history on file.   Current Outpatient Prescriptions  Medication Sig Dispense Refill  . aspirin 81 MG tablet Take 81 mg by mouth daily.      . metoprolol succinate (TOPROL-XL) 50 MG 24 hr tablet TAKE ONE TABLET BY MOUTH DAILY. TAKE WITH OR IMMEDIATELY FOLLOWING A MEAL 90 tablet 0   No current facility-administered medications for this visit.     Allergies:   Penicillin g    Social History:  The patient  reports that he has quit smoking. He does not have any  smokeless tobacco history on file. He reports that he does not drink alcohol.   Family History:  The patient's family history includes Cancer in his sister and sister; Heart disease in his mother.    ROS:  Please see the history of present illness.    Review of Systems: Constitutional:  denies fever, chills, diaphoresis, appetite change and fatigue.  HEENT: denies photophobia, eye pain, redness, hearing loss, ear pain, congestion, sore throat, rhinorrhea, sneezing, neck pain, neck stiffness and tinnitus.  Respiratory: denies SOB, DOE, cough, chest tightness, and wheezing.  Cardiovascular: denies chest pain, palpitations and leg swelling.  Gastrointestinal: denies nausea, vomiting, abdominal pain, diarrhea, constipation, blood in stool.  Genitourinary: denies dysuria, urgency, frequency, hematuria, flank pain and difficulty urinating.  Musculoskeletal: denies  myalgias, back pain, joint swelling, arthralgias and gait problem.   Skin: denies pallor, rash and wound.  Neurological: denies dizziness, seizures, syncope, weakness, light-headedness, numbness and headaches.   Hematological: denies adenopathy, easy bruising, personal or family bleeding history.  Psychiatric/ Behavioral: denies suicidal ideation, mood changes, confusion, nervousness, sleep disturbance and agitation.       All other systems are reviewed and negative.    PHYSICAL EXAM: VS:  BP 130/75 (BP Location: Right Arm, Cuff Size: Normal)   Pulse 74   Ht 5'  11" (1.803 m)   Wt 163 lb 6.4 oz (74.1 kg)   SpO2 95%   BMI 22.79 kg/m  , BMI Body mass index is 22.79 kg/m. GEN: Well nourished, well developed, in no acute distress  HEENT: normal  Neck: no JVD, carotid bruits, or masses Cardiac: RRR; no murmurs, rubs, or gallops,no edema  Respiratory:  clear to auscultation bilaterally, normal work of breathing GI: soft, nontender, nondistended, + BS MS: no deformity or atrophy  Skin: warm and dry, no rash Neuro:  Strength  and sensation are intact Psych: normal   EKG:  EKG is ordered today. The ekg ordered today demonstrates NSR at 74.  Small inf. Q waves.    No ST or T wave changes.    Recent Labs: No results found for requested labs within last 8760 hours.    Lipid Panel    Component Value Date/Time   CHOL 138 06/05/2015 0811   TRIG 92 06/05/2015 0811   HDL 32 (L) 06/05/2015 0811   CHOLHDL 4.3 06/05/2015 0811   VLDL 18 06/05/2015 0811   LDLCALC 88 06/05/2015 0811      Wt Readings from Last 3 Encounters:  08/09/16 163 lb 6.4 oz (74.1 kg)  06/05/15 164 lb 1.9 oz (74.4 kg)  06/28/14 167 lb 6.4 oz (75.9 kg)      Other studies Reviewed: Additional studies/ records that were reviewed today include: . Review of the above records demonstrates:    ASSESSMENT AND PLAN:  1. History of hyperlipidemia - continue fenofibrate.  Will check labs today.  Will see him in 1 year   2. Tachycardia- resolved. He is able to work without any difficulty   3. Prostate Cancer - followed by Dr. [name].   Current medicines are reviewed at length with the patient today.  The patient does not have concerns regarding medicines.  The following changes have been made:  no change  Labs/ tests ordered today include:  No orders of the defined types were placed in this encounter.    Disposition:   FU with me in 1 year    Signed, Mertie Moores, MD  08/09/2016 9:43 AM    [facility] [place], [place], [place]  [address] Phone: [phone]; Fax: [phone]

## 2016-08-09 NOTE — Patient Instructions (Signed)
Medication Instructions:  Your physician recommends that you continue on your current medications as directed. Please refer to the Current Medication list given to you today.   Labwork: TODAY - complete metabolic panel, cholesterol   Testing/Procedures: None Ordered   Follow-Up: Your physician wants you to follow-up in: 1 year with Dr. Acie Fredrickson. You will receive a reminder letter in the mail two months in advance. If you don't receive a letter, please call our office to schedule the follow-up appointment.   If you need a refill on your cardiac medications before your next appointment, please call your pharmacy.   Thank you for choosing [facility]! Christen Bame, RN [phone]

## 2016-08-12 ENCOUNTER — Telehealth: Payer: Self-pay | Admitting: Cardiovascular Disease

## 2016-08-12 NOTE — Telephone Encounter (Signed)
Lab results reviewed with patient who verbalized understanding and thanked me for the call

## 2016-08-12 NOTE — Telephone Encounter (Signed)
Pt want to know his lab results from 5.7.18. Please advise

## 2016-10-22 ENCOUNTER — Encounter (HOSPITAL_COMMUNITY): Payer: Self-pay

## 2016-10-22 ENCOUNTER — Emergency Department (HOSPITAL_COMMUNITY): Payer: Medicare Other

## 2016-10-22 ENCOUNTER — Inpatient Hospital Stay (HOSPITAL_COMMUNITY)
Admission: EM | Admit: 2016-10-22 | Discharge: 2016-10-25 | DRG: 683 | Disposition: A | Discharge status: Home / Self Care | Payer: Medicare Other | Attending: Nephrology | Admitting: Nephrology

## 2016-10-22 DIAGNOSIS — N401 Enlarged prostate with lower urinary tract symptoms: Secondary | ICD-10-CM | POA: Diagnosis present

## 2016-10-22 DIAGNOSIS — E875 Hyperkalemia: Secondary | ICD-10-CM | POA: Diagnosis present

## 2016-10-22 DIAGNOSIS — Z8546 Personal history of malignant neoplasm of prostate: Secondary | ICD-10-CM

## 2016-10-22 DIAGNOSIS — Z87891 Personal history of nicotine dependence: Secondary | ICD-10-CM

## 2016-10-22 DIAGNOSIS — Z8249 Family history of ischemic heart disease and other diseases of the circulatory system: Secondary | ICD-10-CM

## 2016-10-22 DIAGNOSIS — Z880 Allergy status to penicillin: Secondary | ICD-10-CM

## 2016-10-22 DIAGNOSIS — Z809 Family history of malignant neoplasm, unspecified: Secondary | ICD-10-CM | POA: Diagnosis not present

## 2016-10-22 DIAGNOSIS — Z7982 Long term (current) use of aspirin: Secondary | ICD-10-CM | POA: Diagnosis not present

## 2016-10-22 DIAGNOSIS — E785 Hyperlipidemia, unspecified: Secondary | ICD-10-CM | POA: Diagnosis present

## 2016-10-22 DIAGNOSIS — I10 Essential (primary) hypertension: Secondary | ICD-10-CM | POA: Diagnosis present

## 2016-10-22 DIAGNOSIS — N19 Unspecified kidney failure: Secondary | ICD-10-CM

## 2016-10-22 DIAGNOSIS — E870 Hyperosmolality and hypernatremia: Secondary | ICD-10-CM | POA: Diagnosis not present

## 2016-10-22 DIAGNOSIS — N179 Acute kidney failure, unspecified: Principal | ICD-10-CM | POA: Diagnosis present

## 2016-10-22 DIAGNOSIS — N1330 Unspecified hydronephrosis: Secondary | ICD-10-CM | POA: Diagnosis present

## 2016-10-22 DIAGNOSIS — N138 Other obstructive and reflux uropathy: Secondary | ICD-10-CM | POA: Diagnosis present

## 2016-10-22 LAB — COMPREHENSIVE METABOLIC PANEL
ALBUMIN: 3.8 g/dL (ref 3.5–5.0)
ALK PHOS: 89 U/L (ref 38–126)
ALT: 17 U/L (ref 17–63)
ANION GAP: 16 — AB (ref 5–15)
AST: 17 U/L (ref 15–41)
BUN: 94 mg/dL — ABNORMAL HIGH (ref 6–20)
CALCIUM: 9.3 mg/dL (ref 8.9–10.3)
CHLORIDE: 101 mmol/L (ref 101–111)
CO2: 20 mmol/L — AB (ref 22–32)
Creatinine, Ser: 14.04 mg/dL — ABNORMAL HIGH (ref 0.61–1.24)
GFR calc non Af Amer: 3 mL/min — ABNORMAL LOW (ref >60)
GFR, EST AFRICAN AMERICAN: 3 mL/min — AB (ref >60)
GLUCOSE: 104 mg/dL — AB (ref 65–99)
POTASSIUM: 6.3 mmol/L — AB (ref 3.5–5.1)
SODIUM: 137 mmol/L (ref 135–145)
Total Bilirubin: 0.8 mg/dL (ref 0.3–1.2)
Total Protein: 7.7 g/dL (ref 6.5–8.1)

## 2016-10-22 LAB — CBC WITH DIFFERENTIAL/PLATELET
BASOS PCT: 0 %
Basophils Absolute: 0 10*3/uL (ref 0.0–0.1)
EOS PCT: 0 %
Eosinophils Absolute: 0 10*3/uL (ref 0.0–0.7)
HCT: 39.8 % (ref 39.0–52.0)
HEMOGLOBIN: 13.2 g/dL (ref 13.0–17.0)
LYMPHS ABS: 1.1 10*3/uL (ref 0.7–4.0)
Lymphocytes Relative: 14 %
MCH: 29.2 pg (ref 26.0–34.0)
MCHC: 33.2 g/dL (ref 30.0–36.0)
MCV: 88.1 fL (ref 78.0–100.0)
MONOS PCT: 3 %
Monocytes Absolute: 0.2 10*3/uL (ref 0.1–1.0)
NEUTROS PCT: 83 %
Neutro Abs: 6.1 10*3/uL (ref 1.7–7.7)
PLATELETS: 268 10*3/uL (ref 150–400)
RBC: 4.52 MIL/uL (ref 4.22–5.81)
RDW: 13.5 % (ref 11.5–15.5)
WBC: 7.4 10*3/uL (ref 4.0–10.5)

## 2016-10-22 MED ORDER — ACETAMINOPHEN 325 MG PO TABS
650.0000 mg | ORAL_TABLET | Freq: Four times a day (QID) | ORAL | Status: DC | PRN
Start: 2016-10-22 — End: 2016-10-25
  Administered 2016-10-24: 650 mg via ORAL
  Filled 2016-10-22: qty 2

## 2016-10-22 MED ORDER — ONDANSETRON HCL 4 MG/2ML IJ SOLN
4.0000 mg | Freq: Four times a day (QID) | INTRAMUSCULAR | Status: DC | PRN
  Administered 2016-10-22 – 2016-10-24 (×3): 4 mg via INTRAVENOUS
  Filled 2016-10-22 (×3): qty 2

## 2016-10-22 MED ORDER — PATIROMER SORBITEX CALCIUM 8.4 G PO PACK
8.4000 g | PACK | Freq: Every day | ORAL | Status: DC
  Administered 2016-10-23: 8.4 g via ORAL
  Filled 2016-10-22 (×2): qty 4

## 2016-10-22 MED ORDER — METOPROLOL SUCCINATE ER 50 MG PO TB24
50.0000 mg | ORAL_TABLET | Freq: Every day | ORAL | Status: DC
  Administered 2016-10-23 – 2016-10-25 (×3): 50 mg via ORAL
  Filled 2016-10-22 (×3): qty 1

## 2016-10-22 MED ORDER — TAMSULOSIN HCL 0.4 MG PO CAPS
0.4000 mg | ORAL_CAPSULE | Freq: Every day | ORAL | Status: DC
  Administered 2016-10-23 – 2016-10-25 (×3): 0.4 mg via ORAL
  Filled 2016-10-22 (×3): qty 1

## 2016-10-22 MED ORDER — SODIUM POLYSTYRENE SULFONATE 15 GM/60ML PO SUSP
30.0000 g | Freq: Once | ORAL | Status: AC
  Administered 2016-10-22: 30 g via ORAL
  Filled 2016-10-22: qty 120

## 2016-10-22 MED ORDER — ONDANSETRON 4 MG PO TBDP
4.0000 mg | ORAL_TABLET | Freq: Once | ORAL | Status: AC
  Administered 2016-10-22: 4 mg via ORAL

## 2016-10-22 MED ORDER — ONDANSETRON HCL 4 MG PO TABS
4.0000 mg | ORAL_TABLET | Freq: Four times a day (QID) | ORAL | Status: DC | PRN
  Filled 2016-10-22: qty 1

## 2016-10-22 MED ORDER — SODIUM CHLORIDE 0.9 % IV SOLN
1.0000 g | INTRAVENOUS | Status: AC
  Administered 2016-10-22: 1 g via INTRAVENOUS
  Filled 2016-10-22: qty 10

## 2016-10-22 MED ORDER — POLYETHYLENE GLYCOL 3350 17 G PO PACK: 17.0000 g | PACK | Freq: Every day | ORAL | Status: DC | PRN

## 2016-10-22 MED ORDER — ACETAMINOPHEN 650 MG RE SUPP: 650.0000 mg | Freq: Four times a day (QID) | RECTAL | Status: DC | PRN

## 2016-10-22 MED ORDER — OXYCODONE HCL 5 MG PO TABS
5.0000 mg | ORAL_TABLET | ORAL | Status: DC | PRN
  Administered 2016-10-22 – 2016-10-23 (×3): 5 mg via ORAL
  Filled 2016-10-22 (×3): qty 1

## 2016-10-22 MED ORDER — ONDANSETRON 4 MG PO TBDP
ORAL_TABLET | ORAL | Status: AC
  Filled 2016-10-22: qty 1

## 2016-10-22 MED ORDER — SODIUM CHLORIDE 0.9 % IV BOLUS (SEPSIS)
1000.0000 mL | Freq: Once | INTRAVENOUS | Status: AC
  Administered 2016-10-22: 1000 mL via INTRAVENOUS

## 2016-10-22 MED ORDER — LIDOCAINE HCL 2 % EX GEL
1.0000 "application " | Freq: Once | CUTANEOUS | Status: AC
  Administered 2016-10-22: 1 via URETHRAL
  Filled 2016-10-22: qty 5

## 2016-10-22 NOTE — Procedures (Signed)
Foley catheter procedure note  Indications: see urology consult note from same day  Surgeon: [name], MD  Assistants: None  Procedure Details:  Informed consent was obtained. Patient was placed in the supine prosition, prepped with betadine and draped in the usual sterile fashion. Lidocaine jelly was inserted per urethra prior to the procedure. I then inserted a 18 [place] coude catheter per urethra which passed with difficulty with significant resistance met at the prostatic urethra. I achieved return of minimal clear yellow urine and the proceeded to insert 47m of sterile water into the foley balloon. The catheter was attached to a drainage bag and secured with a statlock. Due to minimal output of urine, I then irrigated the catheter with normal saline and an irrigation tip syringe. The catheter flushed easily, confirming it's placement.   Complication: None, patient tolerated the procedure well   Plan:  1. See urology consult note from same day  Attending Attestation: Dr. HLouis Meckelwas available.   ---- PJonna Clark MD Urologic Surgery Resident

## 2016-10-22 NOTE — ED Notes (Signed)
No urine noted in bladder scan. EDP made aware. Pt. Does not know last time he urinated. Was not today.

## 2016-10-22 NOTE — ED Notes (Signed)
Attempted report 

## 2016-10-22 NOTE — H&P (Addendum)
History and Physical    Randall Little KNL:[id number] DOB: [birth year] DOA: 10/22/2016  PCP: Leonard Downing, MD  Patient coming from:Home  Chief Complaint:right flank pain  HPI: Randall Little is a 83 y.o. male with medical history significant of tachycardia, prostate cancer follows with urologist, dyslipidemia presented with worsening right flank pain for about one half month. Patient reported he was seen at outside hospital on July 4 when the CAT scan was done and patient was discharged home. The pain did not really improved and was later seen by PCP and urologist. He however received antibiotics multiple times for UTI. Denied fever, chills. This morning he had nausea vomiting and unable to urinate therefore concerned and decided to come to the hospital. Denied chest pain, shortness of breath, headache or dizziness.  ED Course: In the ER, patient was found to have elevated serum creatinine level of 14 completely normal serum creatinine level about a month ago. Associated with mild hyperkalemia. Treated with medical management. CT scan of abdomen and pelvis consistent with moderate bilateral hydroureteronephrosis and under distended thick-walled bladder. Finding may reflect chronic bladder outlet obstruction. Patient also with nonobstructive right renal calculi. Foley catheter insertion were tried and was unsuccessful. Nephrology and urology was consulted. Asked [facility] to admit the patient for further evaluation.  Review of Systems: As per HPI otherwise 10 point review of systems negative.    Past Medical History:  Diagnosis Date  . Arrhythmia    Tachycardia  . Dyslipidemia   . SOB (shortness of breath)     History reviewed. No pertinent surgical history. No pertinent surgical history, reviewed with the patient  Social history: reports that he has quit smoking. He has never used smokeless tobacco. He reports that he does not drink alcohol. His drug history is not on file.  Allergies   Allergen Reactions  . Penicillin G Rash    Family History  Problem Relation Age of Onset  . Heart disease Mother   . Cancer Sister   . Cancer Sister      Prior to Admission medications   Medication Sig Start Date End Date Taking? Authorizing Provider  aspirin 81 MG tablet Take 81 mg by mouth daily.      [provider]  metoprolol succinate (TOPROL-XL) 50 MG 24 hr tablet TAKE ONE TABLET BY MOUTH DAILY. TAKE WITH OR IMMEDIATELY FOLLOWING A MEAL 07/30/16   Nahser, Wonda Cheng, MD    Physical Exam: Vitals:   10/22/16 1600 10/22/16 1630 10/22/16 1700 10/22/16 1730  BP: (!) 152/76 (!) 147/75 (!) 158/78 (!) 165/84  Pulse: 71 68 71 78  Resp: (!) 21 15 16  (!) 24  Temp:      TempSrc:      SpO2: 98% 93% 97% 100%      Constitutional: NAD, calm, comfortable Vitals:   10/22/16 1600 10/22/16 1630 10/22/16 1700 10/22/16 1730  BP: (!) 152/76 (!) 147/75 (!) 158/78 (!) 165/84  Pulse: 71 68 71 78  Resp: (!) 21 15 16  (!) 24  Temp:      TempSrc:      SpO2: 98% 93% 97% 100%   Eyes: PERRL ENMT: Mucous membranes are moist.   Neck: normal, supple Respiratory: clear to auscultation bilaterally, no wheezing, no crackles. Normal respiratory effort. No accessory muscle use.  Cardiovascular: Regular rate and rhythm, no murmurs / rubs / gallops. No extremity edema. 2+ pedal pulses.   Abdomen: no tenderness. Bowel sounds positive. Soft. No pelvic distention Musculoskeletal: no clubbing /  cyanosis. No joint deformity upper and lower extremities.  Skin: no rashes, lesions, ulcers. No induration Neurologic: Nonfocal neurological exam. Psychiatric: Normal judgment and insight. Alert and oriented x 3. Normal mood.    Labs on Admission: I have personally reviewed following labs and imaging studies  CBC:  Recent Labs Lab 10/22/16 1344  WBC 7.4  NEUTROABS 6.1  HGB 13.2  HCT 39.8  MCV 88.1  PLT 409   Basic Metabolic Panel:  Recent Labs Lab 10/22/16 1344  NA 137  K 6.3*  CL 101   CO2 20*  GLUCOSE 104*  BUN 94*  CREATININE 14.04*  CALCIUM 9.3   GFR: CrCl cannot be calculated (Unknown ideal weight.). Liver Function Tests:  Recent Labs Lab 10/22/16 1344  AST 17  ALT 17  ALKPHOS 89  BILITOT 0.8  PROT 7.7  ALBUMIN 3.8   No results for input(s): LIPASE, AMYLASE in the last 168 hours. No results for input(s): AMMONIA in the last 168 hours. Coagulation Profile: No results for input(s): INR, PROTIME in the last 168 hours. Cardiac Enzymes: No results for input(s): CKTOTAL, CKMB, CKMBINDEX, TROPONINI in the last 168 hours. BNP (last 3 results) No results for input(s): PROBNP in the last 8760 hours. HbA1C: No results for input(s): HGBA1C in the last 72 hours. CBG: No results for input(s): GLUCAP in the last 168 hours. Lipid Profile: No results for input(s): CHOL, HDL, LDLCALC, TRIG, CHOLHDL, LDLDIRECT in the last 72 hours. Thyroid Function Tests: No results for input(s): TSH, T4TOTAL, FREET4, T3FREE, THYROIDAB in the last 72 hours. Anemia Panel: No results for input(s): VITAMINB12, FOLATE, FERRITIN, TIBC, IRON, RETICCTPCT in the last 72 hours. Urine analysis: No results found for: COLORURINE, APPEARANCEUR, LABSPEC, PHURINE, GLUCOSEU, HGBUR, BILIRUBINUR, KETONESUR, PROTEINUR, UROBILINOGEN, NITRITE, LEUKOCYTESUR Sepsis Labs: !!!!!!!!!!!!!!!!!!!!!!!!!!!!!!!!!!!!!!!!!!!! @LABRCNTIP (procalcitonin:4,lacticidven:4) )No results found for this or any previous visit (from the past 240 hour(s)).   Radiological Exams on Admission: Ct Renal Stone Study  Result Date: 10/22/2016 CLINICAL DATA:  Left flank pain, history of renal stones, renal failure EXAM: CT ABDOMEN AND PELVIS WITHOUT CONTRAST TECHNIQUE: Multidetector CT imaging of the abdomen and pelvis was performed following the standard protocol without IV contrast. COMPARISON:  10/06/2016 FINDINGS: Lower chest: Trace bilateral pleural effusions. Hepatobiliary: 2.0 cm cyst adjacent to the caudate (series 3/ image  18). Status post cholecystectomy. No intrahepatic or extrahepatic ductal dilatation. Pancreas: Within normal limits. Spleen: Within normal limits. Adrenals/Urinary Tract: Adrenal glands are within normal limits. Punctate nonobstructing right upper pole renal calculus (series 3/ image 28). 2 mm layering calculus in the distal right ureter (series 3/image 35). Possible layering punctate distal left ureteral calculus (series 3/image 37). Moderate bilateral hydroureteronephrosis. Thick-walled bladder, although exacerbated by underdistention. Stomach/Bowel: Stomach is notable for a small hiatal hernia. No evidence of bowel obstruction. Normal appendix (series 3/ image 54). Colonic diverticulosis, without evidence of diverticulitis. Vascular/Lymphatic: No evidence of abdominal aortic aneurysm. Atherosclerotic calcifications of the abdominal aorta and branch vessels. No suspicious abdominopelvic lymphadenopathy. Reproductive:  Mild prostatomegaly. Other: No abdominopelvic ascites. Small fat containing bilateral inguinal hernias (series 3/ image 81). Musculoskeletal: Visualized osseous structures are within normal limits. IMPRESSION: Moderate bilateral hydroureteronephrosis, mildly progressive. Bladder is thick-walled although underdistended. These findings are nonspecific but may reflect chronic bladder outlet obstruction. Punctate nonobstructing right upper pole renal calculus. 2 mm layering calculus the distal right ureter. Suspected layering punctate distal left ureteral calculus. Mild prostatomegaly. Additional ancillary findings as above. Electronically Signed   By: Julian Hy M.D.   On: 10/22/2016 16:10  EKG: Independently reviewed. Normal sinus rhythm with peaked T wave in lead V2. Assessment/Plan Active Problems:   AKI (acute kidney injury) ([place])  # Advanced acute kidney injury due to obstructive uropathy in the setting of moderate bilateral hydroureteronephrosis with nonobstructive right renal  calculi: -Patient presented with right flank pain. Bladder scan in ER with no urine in the bladder. The Foley catheter insertion was unsuccessful. Patient with history of prostate cancer and follows up with urologist outpatient. He does not have cardiac rubs however has mild hyperkalemia and significant elevation in serum creatinine level. No absolute urgent indication for dialysis tonight. Discussed with the nephrologist in the ER. Urgent urology consult requested and discussed with Dr.Filippou. -resume flomax. -Holding IV fluid because of obstructive uropathy and unable to void. Consider IV fluid when the obstruction is relieved. -Check UA and urine culture. -Repeat lab in the morning. -Follow-up urologist and nephrologist plan and recommendation.  #Right flank pain in the setting of hydronephrosis. Patient has no fever, leukocytosis. UA was ordered. Holding off on antibiotics. -Pain management with Tylenol and oxycodone as needed  #Mild hyperkalemia with mild T-wave peaked in V2: Received calcium gluconate in the ER. Order a dose of kayexalate. Repeat lab in the morning.  #History of sinus tachycardia/hypertension: Continue metoprolol at home dose. Continue to monitor blood pressure. Holding aspirin because patient may need procedure.  DVT prophylaxis: SCD. No anticoagulation because of possible need of urology procedure Code Status: Full code Family Communication: Patient's ex-wife at bedside in the ER Disposition Plan: Admission to the floor/telemetry Consults called: Urologist and nephrologist Admission status: Inpatient because of severity of illness and comorbidities.   Pink Maye Tanna Furry MD [facility] Pager [phone]  If 7PM-7AM, please contact night-coverage [email or web address] Password [facility]  10/22/2016, 6:03 PM

## 2016-10-22 NOTE — Consult Note (Signed)
Urology Consult Note    Requesting Attending Physician:  [name], MD Service Requesting Consult:  Hospitalist Service Providing Consult: Urology  Consulting Attending: Dr. Louis Meckel   Assessment:  Patient is a 83 y.o. male with history of low risk Gleason 3+3 prostate cancer presenting with several weeks of flank pain and LUTS, found to be in acute renal failure with a Cr 14 and hyperkalemia. CT scan demonstrating bilateral hydroureteronephrosis to the level of the bladder with a thickened bladder wall, consistent with likely bladder outlet obstruction being the cause of his acute renal failure. Interestingly, the bladder appears decompressed on imaging.   Additionally on CT,  bilateral distal ureteral stones are noted. Ureteral dilation appears to extend beyond the bilateral stones, arguing that these are non obstructing, but this cannot be excluded as a cause for his bilateral hydroureteronephrosis and kidney failure. The fact that patient was urinating and has now not voided for >12 hours argues that his stones are causing his bilateral hydroureteronephrosis.   Will plan to decompress bladder with a foley catheter, and evaluate his creatinine trend. If patient's creatinine does not improve with catheter placement, this indicates his stones are the cause of obstruction, and patient will need bilateral ureteral stents versus bilateral nephrostomy tubes. This was explained to the patient today, who agrees to proceed as long as these interventions are temporary.    See separate procedure note for foley catheter placement dictation.   Recommendations: 1. Continue foley catheter to drainage  2. NPO at midnight in event that patient will need surgical versus VIR intervention tomorrow  3. Agree with nephrology, hospitalist involvement in care  4. Start flomax  5. Expect some blood surrounding catheter, or even from urethral meatus around foley, given difficulty of placement. Clear  yellow urine on irrigation, not concerned re foley obstruction from blood.    Thank you for this consult. Please contact the urology consult pager with any further questions/concerns.  Randall Clark, MD Urology Surgical Resident  -----  Reason for Consult: bilateral hydroureteronephrosis   Randall Little is seen in consultation for reasons noted above at the request of [name], MD on the hospitalist service.   This is a 83 y.o. yo patient with a history of Gleason 3+3 prostate cancer PSA 13.9 (followed by Dr. [name]), and several month of urinary symptoms. He was last seen in clinic 10/08/16 at which time he was having LUTS. PVR low in clinic, appears to be emptying his bladder well oreviously.    He presented to the ED today with several weeks of R flank pain radiating to right testicle. Has been to an OSH ED in [facility] as well with negative imaging recently. Has been treated with multiple course of antibiotics to treat his LUTS, without improvement in symptoms. He endorses dehydration and not being able to urinate, no void since yesterday. Prior to this, he had frequency and nocturia.  He does have a history of kidney stones. Has never been catheterized or had urologic surgery. Denies fevers, chills, nausea, vomiting. Endorses ongoing dysuria, frequency, urgency.    Past Medical History: Past Medical History:  Diagnosis Date  . Arrhythmia    Tachycardia  . Dyslipidemia   . SOB (shortness of breath)     Past Surgical History:  History reviewed. No pertinent surgical history.  Medication: Current Facility-Administered Medications  Medication Dose Route Frequency Provider Last Rate Last Dose  . acetaminophen (TYLENOL) tablet 650 mg  650 mg Oral Q6H PRN Carolin Sicks, [place]  Reesa Chew, MD       Or  . acetaminophen (TYLENOL) suppository 650 mg  650 mg Rectal Q6H PRN [name], MD      . Derrill Memo ON 10/23/2016] metoprolol succinate (TOPROL-XL) 24 hr tablet 50 mg   50 mg Oral Daily [name], MD      . ondansetron [facility]) tablet 4 mg  4 mg Oral Q6H PRN [name], MD       Or  . ondansetron [facility]) injection 4 mg  4 mg Intravenous Q6H PRN [name], MD      . oxyCODONE (Oxy IR/ROXICODONE) immediate release tablet 5 mg  5 mg Oral Q4H PRN [name], MD      . polyethylene glycol (MIRALAX / GLYCOLAX) packet 17 g  17 g Oral Daily PRN [name], MD      . sodium polystyrene (KAYEXALATE) 15 GM/60ML suspension 30 g  30 g Oral Once [name], MD      . Derrill Memo ON 10/23/2016] tamsulosin (FLOMAX) capsule 0.4 mg  0.4 mg Oral QPC breakfast [name], MD       Current Outpatient Prescriptions  Medication Sig Dispense Refill  . aspirin 81 MG tablet Take 81 mg by mouth daily.      . metoprolol succinate (TOPROL-XL) 50 MG 24 hr tablet TAKE ONE TABLET BY MOUTH DAILY. TAKE WITH OR IMMEDIATELY FOLLOWING A MEAL 90 tablet 0    Allergies: Allergies  Allergen Reactions  . Penicillin G Rash    Has patient had a PCN reaction causing immediate rash, facial/tongue/throat swelling, SOB or lightheadedness with hypotension: Yes Has patient had a PCN reaction causing severe rash involving mucus membranes or skin necrosis: No Has patient had a PCN reaction that required hospitalization: No Has patient had a PCN reaction occurring within the last 10 years: Yes If all of the above answers are "NO", then may proceed with Cephalosporin use.    Social History: Social History  Substance Use Topics  . Smoking status: Former Research scientist (life sciences)  . Smokeless tobacco: Never Used  . Alcohol use No    Family History Family History  Problem Relation Age of Onset  . Heart disease Mother   . Cancer Sister   . Cancer Sister     Review of Systems 10 systems were reviewed and are negative except as noted specifically in the HPI.  Objective   Vital signs in last 24 hours: BP (!) 158/80   Pulse 77   Temp (!)  97.5 F (36.4 C) (Oral)   Resp 14   SpO2 96%   Intake/Output last 3 shifts: No intake/output data recorded.  Physical Exam General: NAD, A&O, resting, appropriate HEENT: [place]/AT, EOMI, MMM Pulmonary: Normal work of breathing on room air Cardiovascular: HDS, adequate peripheral perfusion Abdomen: soft, NTTP, nondistended, no suprapubic fullness or tenderness GU: bilateral  CVA tenderness DRE: deferred Extremities: warm and well perfused, no edema Neuro: Appropriate, no focal neurological deficits  Most Recent Labs: Lab Results  Component Value Date   WBC 7.4 10/22/2016   HGB 13.2 10/22/2016   HCT 39.8 10/22/2016   PLT 268 10/22/2016    Lab Results  Component Value Date   NA 137 10/22/2016   K 6.3 (HH) 10/22/2016   CL 101 10/22/2016   CO2 20 (L) 10/22/2016   BUN 94 (H) 10/22/2016   CREATININE 14.04 (H) 10/22/2016   CALCIUM 9.3 10/22/2016    Lab Results  Component Value Date  ALKPHOS 89 10/22/2016   BILITOT 0.8 10/22/2016   BILIDIR 0.1 06/05/2015   PROT 7.7 10/22/2016   ALBUMIN 3.8 10/22/2016   ALT 17 10/22/2016   AST 17 10/22/2016    No results found for: INR, APTT   Urine Culture: Pending    IMAGING: Ct Renal Stone Study  Result Date: 10/22/2016 CLINICAL DATA:  Left flank pain, history of renal stones, renal failure EXAM: CT ABDOMEN AND PELVIS WITHOUT CONTRAST TECHNIQUE: Multidetector CT imaging of the abdomen and pelvis was performed following the standard protocol without IV contrast. COMPARISON:  10/06/2016 FINDINGS: Lower chest: Trace bilateral pleural effusions. Hepatobiliary: 2.0 cm cyst adjacent to the caudate (series 3/ image 18). Status post cholecystectomy. No intrahepatic or extrahepatic ductal dilatation. Pancreas: Within normal limits. Spleen: Within normal limits. Adrenals/Urinary Tract: Adrenal glands are within normal limits. Punctate nonobstructing right upper pole renal calculus (series 3/ image 28). 2 mm layering calculus in the distal  right ureter (series 3/image 35). Possible layering punctate distal left ureteral calculus (series 3/image 37). Moderate bilateral hydroureteronephrosis. Thick-walled bladder, although exacerbated by underdistention. Stomach/Bowel: Stomach is notable for a small hiatal hernia. No evidence of bowel obstruction. Normal appendix (series 3/ image 54). Colonic diverticulosis, without evidence of diverticulitis. Vascular/Lymphatic: No evidence of abdominal aortic aneurysm. Atherosclerotic calcifications of the abdominal aorta and branch vessels. No suspicious abdominopelvic lymphadenopathy. Reproductive:  Mild prostatomegaly. Other: No abdominopelvic ascites. Small fat containing bilateral inguinal hernias (series 3/ image 81). Musculoskeletal: Visualized osseous structures are within normal limits. IMPRESSION: Moderate bilateral hydroureteronephrosis, mildly progressive. Bladder is thick-walled although underdistended. These findings are nonspecific but may reflect chronic bladder outlet obstruction. Punctate nonobstructing right upper pole renal calculus. 2 mm layering calculus the distal right ureter. Suspected layering punctate distal left ureteral calculus. Mild prostatomegaly. Additional ancillary findings as above. Electronically Signed   By: Julian Hy M.D.   On: 10/22/2016 16:10

## 2016-10-22 NOTE — ED Notes (Signed)
Patient transported to CT 

## 2016-10-22 NOTE — ED Triage Notes (Signed)
Pt presents with 3-4 week h/o R flank pain that radiates around into R testicle, denies swelling; has been seen x 2 ER visits, with labs and imaging negative; pt has appointment the end of august with urology.

## 2016-10-22 NOTE — ED Notes (Signed)
2x RN attempted foley insertion. Met with resistance and no urine returned. EDP made aware.

## 2016-10-22 NOTE — ED Notes (Signed)
Nephrology wanting foley catheter inserted in ED due to bladder outlet problems and other urological problems.

## 2016-10-22 NOTE — Consult Note (Addendum)
[facility] Consult Note     Date: 10/22/2016                  Patient Name:  Randall Little  MRN: [id number]  DOB: [birth year]  Age / Sex: 83 y.o., male         PCP: Leonard Downing, MD                 Service Requesting Consult: Dr. Carolin Sicks, [place]; EDP                 Reason for Consult: AKI and hyperkalemia            Chief Complaint:  N/v and R flank pain  HPI: Pt is an 77M with a PMH significant for tachycardia, HLD, and h/o prostate cancer who is now seen in consultation at the request of Dr. Carolin Sicks and EDP for evaluation and receommendations surrounding AKI and hyperkalemia.    Pt was in his usual state of health until about 2 weeks ago when he began having increased R flank pain.  He went to an OSH on July 4th and a CT scan demonstrated bilateral hydronephrosis.  Creatinine was 1.4 at that time.  He was given oxycodone and was told to follow up with his urologist (Dr [name]).  UAs have demonstrated hematuria and he has been given multiple rounds of antibiotics without remission of his symptoms.    He was seen in urology clinic last week and was supposed to have followup later in August, but he was "unable to wait that long."  Due to the severity of his pain, he presented to the ED today.    He was noted to have a K of 6.3, Cr of 13.1, and a CT scan renal protocol noted nonobstructive nephrolithiasis, a thick-walled bladder which was underdistended, and moderate bilateral hydroureteronephrosis.  A Foley was attempted but could not be passed.    Pt reports that he was incontinent of urine the past several days to the point that his wife bought him incontinence underwear.  He thinks he has urinated today.  Past Medical History:  Diagnosis Date  . Arrhythmia    Tachycardia  . Dyslipidemia   . SOB (shortness of breath)     History reviewed. No pertinent surgical history.  Family History  Problem Relation Age of Onset  . Heart disease Mother    . Cancer Sister   . Cancer Sister    Social History:  reports that he has quit smoking. He has never used smokeless tobacco. He reports that he does not drink alcohol. His drug history is not on file.  Allergies:  Allergies  Allergen Reactions  . Penicillin G Rash    Has patient had a PCN reaction causing immediate rash, facial/tongue/throat swelling, SOB or lightheadedness with hypotension: Yes Has patient had a PCN reaction causing severe rash involving mucus membranes or skin necrosis: No Has patient had a PCN reaction that required hospitalization: No Has patient had a PCN reaction occurring within the last 10 years: Yes If all of the above answers are "NO", then may proceed with Cephalosporin use.     (Not in a hospital admission)  Results for orders placed or performed during the hospital encounter of 10/22/16 (from the past 48 hour(s))  CBC with Differential     Status: None   Collection Time: 10/22/16  1:44 PM  Result Value Ref Range   WBC 7.4 4.0 - 10.5 K/uL  RBC 4.52 4.22 - 5.81 MIL/uL   Hemoglobin 13.2 13.0 - 17.0 g/dL   HCT 39.8 39.0 - 52.0 %   MCV 88.1 78.0 - 100.0 fL   MCH 29.2 26.0 - 34.0 pg   MCHC 33.2 30.0 - 36.0 g/dL   RDW 13.5 11.5 - 15.5 %   Platelets 268 150 - 400 K/uL   Neutrophils Relative % 83 %   Neutro Abs 6.1 1.7 - 7.7 K/uL   Lymphocytes Relative 14 %   Lymphs Abs 1.1 0.7 - 4.0 K/uL   Monocytes Relative 3 %   Monocytes Absolute 0.2 0.1 - 1.0 K/uL   Eosinophils Relative 0 %   Eosinophils Absolute 0.0 0.0 - 0.7 K/uL   Basophils Relative 0 %   Basophils Absolute 0.0 0.0 - 0.1 K/uL  Comprehensive metabolic panel     Status: Abnormal   Collection Time: 10/22/16  1:44 PM  Result Value Ref Range   Sodium 137 135 - 145 mmol/L   Potassium 6.3 (HH) 3.5 - 5.1 mmol/L    Comment: NO VISIBLE HEMOLYSIS CRITICAL RESULT CALLED TO, READ BACK BY AND VERIFIED WITH: E.HOWELL,RN 10/22/16 1443 BY BSLADE    Chloride 101 101 - 111 mmol/L   CO2 20 (L) 22 - 32  mmol/L   Glucose, Bld 104 (H) 65 - 99 mg/dL   BUN 94 (H) 6 - 20 mg/dL   Creatinine, Ser 14.04 (H) 0.61 - 1.24 mg/dL   Calcium 9.3 8.9 - 10.3 mg/dL   Total Protein 7.7 6.5 - 8.1 g/dL   Albumin 3.8 3.5 - 5.0 g/dL   AST 17 15 - 41 U/L   ALT 17 17 - 63 U/L   Alkaline Phosphatase 89 38 - 126 U/L   Total Bilirubin 0.8 0.3 - 1.2 mg/dL   GFR calc non Af Amer 3 (L) >60 mL/min   GFR calc Af Amer 3 (L) >60 mL/min    Comment: (NOTE) The eGFR has been calculated using the CKD EPI equation. This calculation has not been validated in all clinical situations. eGFR's persistently <60 mL/min signify possible Chronic Kidney Disease.    Anion gap 16 (H) 5 - 15   Ct Renal Stone Study  Result Date: 10/22/2016 CLINICAL DATA:  Left flank pain, history of renal stones, renal failure EXAM: CT ABDOMEN AND PELVIS WITHOUT CONTRAST TECHNIQUE: Multidetector CT imaging of the abdomen and pelvis was performed following the standard protocol without IV contrast. COMPARISON:  10/06/2016 FINDINGS: Lower chest: Trace bilateral pleural effusions. Hepatobiliary: 2.0 cm cyst adjacent to the caudate (series 3/ image 18). Status post cholecystectomy. No intrahepatic or extrahepatic ductal dilatation. Pancreas: Within normal limits. Spleen: Within normal limits. Adrenals/Urinary Tract: Adrenal glands are within normal limits. Punctate nonobstructing right upper pole renal calculus (series 3/ image 28). 2 mm layering calculus in the distal right ureter (series 3/image 35). Possible layering punctate distal left ureteral calculus (series 3/image 37). Moderate bilateral hydroureteronephrosis. Thick-walled bladder, although exacerbated by underdistention. Stomach/Bowel: Stomach is notable for a small hiatal hernia. No evidence of bowel obstruction. Normal appendix (series 3/ image 54). Colonic diverticulosis, without evidence of diverticulitis. Vascular/Lymphatic: No evidence of abdominal aortic aneurysm. Atherosclerotic calcifications of  the abdominal aorta and branch vessels. No suspicious abdominopelvic lymphadenopathy. Reproductive:  Mild prostatomegaly. Other: No abdominopelvic ascites. Small fat containing bilateral inguinal hernias (series 3/ image 81). Musculoskeletal: Visualized osseous structures are within normal limits. IMPRESSION: Moderate bilateral hydroureteronephrosis, mildly progressive. Bladder is thick-walled although underdistended. These findings are nonspecific but may reflect chronic  bladder outlet obstruction. Punctate nonobstructing right upper pole renal calculus. 2 mm layering calculus the distal right ureter. Suspected layering punctate distal left ureteral calculus. Mild prostatomegaly. Additional ancillary findings as above. Electronically Signed   By: Julian Hy M.D.   On: 10/22/2016 16:10    ROS: all other systems reviewed and are negative except as per HPI  Blood pressure (!) 155/80, pulse 67, temperature (!) 97.5 F (36.4 C), temperature source Oral, resp. rate 15, SpO2 97 %. Physical Exam  GEN well-appearing, NAD HEENT EOMI, PERRL, MMM NECK 5 cm JVD PULM clear bilaterally, normal WOB CV RRR no m/r/g ABD soft, nontender, nondistended, no suprapubic masses felt GU: no flank pain EXT no LE edema NEURO nonfocal, AAO x 3 SKIN maculopapular rash on neck and chin (wife thinks from one antibiotic)   Assessment/Plan  1.  Obstructive uropathy: I suspect BOO with the history of prostate cancer, prostatomegaly, and thick-walled bladder.  He needs a Foley urgently placed.  Urology has been consulted.  If no improvement, will need to consider PCNs (has distal ureteral stones that aren't thought to be primary driver of obstructive uropathy at present).  Continue Flomax  2.  AKI: due to #1 as above.  There are no acute indications for dialysis at present.  Strict I/O.  3.  Hyperkalemia: has already received calcium gluconate; getting Kayexelate.  I will prescribe patiromer to start tomorrow.  4.   Tachycardia: Continue metoprolol.   Madelon Lips, MD [facility] pgr [phone] 10/22/2016, 6:59 PM

## 2016-10-22 NOTE — ED Notes (Signed)
EDP advising patient may be transferred to [facility].

## 2016-10-22 NOTE — ED Provider Notes (Signed)
[place] DEPT Provider Note   CSN: [id number] Arrival date & time: 10/22/16  1330     History   Chief Complaint Chief Complaint  Patient presents with  . Flank Pain    HPI TRASEAN DELIMA is a 83 y.o. male.  The history is provided by the patient and medical records.  Flank Pain     83 y.o. M with hx of dyslipidemia, SOB, episodes of tachycardia, presenting to the ED for right flank pain.  Patient states pain has been radiating around to right groin.  States he has been seen by 5 different doctors-- had CT scan, labs, treatment for UTI, etc. Without explanation of his pain.  States he was seen by PA at urology clinic and is scheduled to see Dr. Beatrix Fetters later this week in clinic.  States he had worsening pain so he came here today.  States pain has subdued slightly and he was initially urinating a great deal, now he feels he is not urinating at all and he feels very dehydrated.  Denies fever/chills/sweats.  States he still works full time for a [employer] exercise [employer] in conjunction with [employer].    Past Medical History:  Diagnosis Date  . Arrhythmia    Tachycardia  . Dyslipidemia   . SOB (shortness of breath)     Patient Active Problem List   Diagnosis Date Noted  . Essential hypertension 08/09/2016  . Hyperlipidemia 12/23/2010  . Tachycardia 12/23/2010    History reviewed. No pertinent surgical history.     Home Medications    Prior to Admission medications   Medication Sig Start Date End Date Taking? Authorizing Provider  aspirin 81 MG tablet Take 81 mg by mouth daily.      [provider]  metoprolol succinate (TOPROL-XL) 50 MG 24 hr tablet TAKE ONE TABLET BY MOUTH DAILY. TAKE WITH OR IMMEDIATELY FOLLOWING A MEAL 07/30/16   Nahser, Wonda Cheng, MD    Family History Family History  Problem Relation Age of Onset  . Heart disease Mother   . Cancer Sister   . Cancer Sister     Social History Social History  Substance Use Topics  . Smoking status:  Former Research scientist (life sciences)  . Smokeless tobacco: Never Used  . Alcohol use No     Allergies   Penicillin g   Review of Systems Review of Systems  Genitourinary: Positive for decreased urine volume and flank pain.  All other systems reviewed and are negative.    Physical Exam Updated Vital Signs BP (!) 182/89   Pulse 74   Temp (!) 97.5 F (36.4 C) (Oral)   Resp 17   SpO2 99%   Physical Exam  Constitutional: He is oriented to person, place, and time. He appears well-developed and well-nourished.  Elderly, pleasant  HENT:  Head: Normocephalic and atraumatic.  Mouth/Throat: Oropharynx is clear and moist.  Mucous membranes appear dry  Eyes: Pupils are equal, round, and reactive to light. Conjunctivae and EOM are normal.  Neck: Normal range of motion.  Cardiovascular: Normal rate, regular rhythm and normal heart sounds.   Pulmonary/Chest: Effort normal and breath sounds normal. No respiratory distress. He has no wheezes.  Abdominal: Soft. Bowel sounds are normal. There is no tenderness. There is no rebound.  No significant bladder distention noted; no tenderness  Musculoskeletal: Normal range of motion.  Neurological: He is alert and oriented to person, place, and time.  Skin: Skin is warm and dry.  Psychiatric: He has a normal mood and affect.  Nursing note and vitals reviewed.    ED Treatments / Results  Labs (all labs ordered are listed, but only abnormal results are displayed) Labs Reviewed  COMPREHENSIVE METABOLIC PANEL - Abnormal; Notable for the following:       Result Value   Potassium 6.3 (*)    CO2 20 (*)    Glucose, Bld 104 (*)    BUN 94 (*)    Creatinine, Ser 14.04 (*)    GFR calc non Af Amer 3 (*)    GFR calc Af Amer 3 (*)    Anion gap 16 (*)    All other components within normal limits  URINE CULTURE  CBC WITH DIFFERENTIAL/PLATELET  URINALYSIS, ROUTINE W REFLEX MICROSCOPIC  CBC  BASIC METABOLIC PANEL    EKG  EKG Interpretation None        Radiology Ct Renal Stone Study  Result Date: 10/22/2016 CLINICAL DATA:  Left flank pain, history of renal stones, renal failure EXAM: CT ABDOMEN AND PELVIS WITHOUT CONTRAST TECHNIQUE: Multidetector CT imaging of the abdomen and pelvis was performed following the standard protocol without IV contrast. COMPARISON:  10/06/2016 FINDINGS: Lower chest: Trace bilateral pleural effusions. Hepatobiliary: 2.0 cm cyst adjacent to the caudate (series 3/ image 18). Status post cholecystectomy. No intrahepatic or extrahepatic ductal dilatation. Pancreas: Within normal limits. Spleen: Within normal limits. Adrenals/Urinary Tract: Adrenal glands are within normal limits. Punctate nonobstructing right upper pole renal calculus (series 3/ image 28). 2 mm layering calculus in the distal right ureter (series 3/image 35). Possible layering punctate distal left ureteral calculus (series 3/image 37). Moderate bilateral hydroureteronephrosis. Thick-walled bladder, although exacerbated by underdistention. Stomach/Bowel: Stomach is notable for a small hiatal hernia. No evidence of bowel obstruction. Normal appendix (series 3/ image 54). Colonic diverticulosis, without evidence of diverticulitis. Vascular/Lymphatic: No evidence of abdominal aortic aneurysm. Atherosclerotic calcifications of the abdominal aorta and branch vessels. No suspicious abdominopelvic lymphadenopathy. Reproductive:  Mild prostatomegaly. Other: No abdominopelvic ascites. Small fat containing bilateral inguinal hernias (series 3/ image 81). Musculoskeletal: Visualized osseous structures are within normal limits. IMPRESSION: Moderate bilateral hydroureteronephrosis, mildly progressive. Bladder is thick-walled although underdistended. These findings are nonspecific but may reflect chronic bladder outlet obstruction. Punctate nonobstructing right upper pole renal calculus. 2 mm layering calculus the distal right ureter. Suspected layering punctate distal left  ureteral calculus. Mild prostatomegaly. Additional ancillary findings as above. Electronically Signed   By: Julian Hy M.D.   On: 10/22/2016 16:10    Procedures Procedures (including critical care time)  Medications Ordered in ED Medications  calcium gluconate 1 g in sodium chloride 0.9 % 100 mL IVPB (1 g Intravenous New Bag/Given 10/22/16 1521)  ondansetron (ZOFRAN-ODT) disintegrating tablet 4 mg (4 mg Oral Given 10/22/16 1346)  sodium chloride 0.9 % bolus 1,000 mL (1,000 mLs Intravenous New Bag/Given 10/22/16 1456)     Initial Impression / Assessment and Plan / ED Course  I have reviewed the triage vital signs and the nursing notes.  Pertinent labs & imaging results that were available during my care of the patient were reviewed by me and considered in my medical decision making (see chart for details).  83 y.o. M here with right flank pain x 1.5 months.  Has had several visits for same with ER, PCP, and urology without explanation.  Returns today due to worsening pain.  On screening labs patient has new acute renal failure with creatinine of 14. Potassium is elevated at 6.3 with peaked T waves seen on EKG. Patient was given IV fluids,  calcium gluconate. On exam he does not have any significant bladder distention and bladder scan was 0. Patient does appear clinically dry and somewhat dehydrated. Discussed with nephrology who has evaluated in the ED, will consult and follow along. Patient will be admitted to medicine service-- they recommended urology consult. Have spoken with them, are recommended coude catheter as Foley could not be placed. They will see in consult as well.  Final Clinical Impressions(s) / ED Diagnoses   Final diagnoses:  Acute renal failure, unspecified acute renal failure type [facility])  Hyperkalemia    New Prescriptions Current Discharge Medication List       Kathryne Hitch 10/22/16 2039    Macarthur Critchley, MD 10/30/16 0147

## 2016-10-22 NOTE — ED Notes (Signed)
Pt. Unable to urinate  

## 2016-10-23 ENCOUNTER — Inpatient Hospital Stay (HOSPITAL_COMMUNITY): Payer: Medicare Other

## 2016-10-23 ENCOUNTER — Encounter (HOSPITAL_COMMUNITY): Payer: Self-pay | Admitting: Interventional Radiology

## 2016-10-23 HISTORY — PX: IR NEPHROSTOMY PLACEMENT RIGHT: IMG6064

## 2016-10-23 HISTORY — PX: IR NEPHROSTOMY PLACEMENT LEFT: IMG6063

## 2016-10-23 LAB — BASIC METABOLIC PANEL
ANION GAP: 15 (ref 5–15)
Anion gap: 15 (ref 5–15)
Anion gap: 17 — ABNORMAL HIGH (ref 5–15)
Anion gap: 14 (ref 5–15)
BUN: 103 mg/dL — AB (ref 6–20)
BUN: 104 mg/dL — ABNORMAL HIGH (ref 6–20)
BUN: 108 mg/dL — ABNORMAL HIGH (ref 6–20)
BUN: 94 mg/dL — ABNORMAL HIGH (ref 6–20)
CALCIUM: 8.9 mg/dL (ref 8.9–10.3)
CHLORIDE: 105 mmol/L (ref 101–111)
CHLORIDE: 104 mmol/L (ref 101–111)
CHLORIDE: 104 mmol/L (ref 101–111)
CO2: 19 mmol/L — AB (ref 22–32)
CO2: 19 mmol/L — ABNORMAL LOW (ref 22–32)
CO2: 20 mmol/L — ABNORMAL LOW (ref 22–32)
CO2: 23 mmol/L (ref 22–32)
CREATININE: 12.31 mg/dL — AB (ref 0.61–1.24)
Calcium: 8.5 mg/dL — ABNORMAL LOW (ref 8.9–10.3)
Calcium: 8.6 mg/dL — ABNORMAL LOW (ref 8.9–10.3)
Calcium: 8.4 mg/dL — ABNORMAL LOW (ref 8.9–10.3)
Chloride: 105 mmol/L (ref 101–111)
Creatinine, Ser: 14.29 mg/dL — ABNORMAL HIGH (ref 0.61–1.24)
Creatinine, Ser: 15 mg/dL — ABNORMAL HIGH (ref 0.61–1.24)
Creatinine, Ser: 15.44 mg/dL — ABNORMAL HIGH (ref 0.61–1.24)
GFR calc Af Amer: 3 mL/min — ABNORMAL LOW (ref >60)
GFR calc Af Amer: 4 mL/min — ABNORMAL LOW (ref >60)
GFR calc non Af Amer: 3 mL/min — ABNORMAL LOW (ref >60)
GFR calc non Af Amer: 2 mL/min — ABNORMAL LOW (ref >60)
GFR calc non Af Amer: 3 mL/min — ABNORMAL LOW (ref >60)
GFR, EST AFRICAN AMERICAN: 3 mL/min — AB (ref >60)
GFR, EST AFRICAN AMERICAN: 3 mL/min — AB (ref >60)
GFR, EST NON AFRICAN AMERICAN: 3 mL/min — AB (ref >60)
GLUCOSE: 97 mg/dL (ref 65–99)
GLUCOSE: 144 mg/dL — AB (ref 65–99)
Glucose, Bld: 124 mg/dL — ABNORMAL HIGH (ref 65–99)
Glucose, Bld: 126 mg/dL — ABNORMAL HIGH (ref 65–99)
POTASSIUM: 6.3 mmol/L — AB (ref 3.5–5.1)
POTASSIUM: 6 mmol/L — AB (ref 3.5–5.1)
POTASSIUM: 6.4 mmol/L — AB (ref 3.5–5.1)
Potassium: 5.2 mmol/L — ABNORMAL HIGH (ref 3.5–5.1)
SODIUM: 140 mmol/L (ref 135–145)
SODIUM: 139 mmol/L (ref 135–145)
Sodium: 139 mmol/L (ref 135–145)
Sodium: 142 mmol/L (ref 135–145)

## 2016-10-23 LAB — PROTIME-INR
INR: 1.22
Prothrombin Time: 15.4 seconds — ABNORMAL HIGH (ref 11.4–15.2)

## 2016-10-23 LAB — CBC
HCT: 36.2 % — ABNORMAL LOW (ref 39.0–52.0)
Hemoglobin: 11.8 g/dL — ABNORMAL LOW (ref 13.0–17.0)
MCH: 28.9 pg (ref 26.0–34.0)
MCHC: 32.6 g/dL (ref 30.0–36.0)
MCV: 88.5 fL (ref 78.0–100.0)
PLATELETS: 254 10*3/uL (ref 150–400)
RBC: 4.09 MIL/uL — AB (ref 4.22–5.81)
RDW: 13.3 % (ref 11.5–15.5)
WBC: 8 10*3/uL (ref 4.0–10.5)

## 2016-10-23 LAB — APTT: aPTT: 34 seconds (ref 24–36)

## 2016-10-23 MED ORDER — CIPROFLOXACIN IN D5W 400 MG/200ML IV SOLN
400.0000 mg | INTRAVENOUS | Status: AC
  Administered 2016-10-23: 400 mg via INTRAVENOUS

## 2016-10-23 MED ORDER — SODIUM POLYSTYRENE SULFONATE 15 GM/60ML PO SUSP
30.0000 g | Freq: Once | ORAL | Status: AC
  Administered 2016-10-23: 30 g via ORAL
  Filled 2016-10-23: qty 120

## 2016-10-23 MED ORDER — CIPROFLOXACIN IN D5W 400 MG/200ML IV SOLN
INTRAVENOUS | Status: AC
  Administered 2016-10-23: 400 mg via INTRAVENOUS
  Filled 2016-10-23: qty 200

## 2016-10-23 MED ORDER — SODIUM CHLORIDE 0.9 % IV SOLN
INTRAVENOUS | Status: DC
Start: 2016-10-23 — End: 2016-10-25
  Administered 2016-10-23 – 2016-10-24 (×2): via INTRAVENOUS

## 2016-10-23 MED ORDER — HYDROMORPHONE HCL 1 MG/ML IJ SOLN
0.5000 mg | Freq: Once | INTRAMUSCULAR | Status: AC
  Administered 2016-10-23: 0.5 mg via INTRAVENOUS
  Filled 2016-10-23: qty 0.5

## 2016-10-23 MED ORDER — IOPAMIDOL (ISOVUE-300) INJECTION 61%
INTRAVENOUS | Status: AC
  Administered 2016-10-23: 30 mL
  Filled 2016-10-23: qty 50

## 2016-10-23 MED ORDER — INSULIN ASPART 100 UNIT/ML IV SOLN
5.0000 [IU] | Freq: Once | INTRAVENOUS | Status: AC
  Administered 2016-10-23: 5 [IU] via INTRAVENOUS

## 2016-10-23 MED ORDER — BELLADONNA ALKALOIDS-OPIUM 16.2-60 MG RE SUPP
1.0000 | Freq: Three times a day (TID) | RECTAL | Status: DC | PRN
  Administered 2016-10-23: 1 via RECTAL
  Filled 2016-10-23 (×2): qty 1

## 2016-10-23 MED ORDER — MIDAZOLAM HCL 2 MG/2ML IJ SOLN
INTRAMUSCULAR | Status: AC | PRN
  Administered 2016-10-23: 1 mg via INTRAVENOUS

## 2016-10-23 MED ORDER — DEXTROSE 50 % IV SOLN
25.0000 g | Freq: Once | INTRAVENOUS | Status: AC
  Administered 2016-10-23: 25 g via INTRAVENOUS
  Filled 2016-10-23: qty 50

## 2016-10-23 MED ORDER — INSULIN ASPART 100 UNIT/ML ~~LOC~~ SOLN: 3.0000 [IU] | Freq: Once | SUBCUTANEOUS | Status: DC

## 2016-10-23 MED ORDER — MIDAZOLAM HCL 2 MG/2ML IJ SOLN
INTRAMUSCULAR | Status: AC
  Filled 2016-10-23: qty 6

## 2016-10-23 MED ORDER — PHENAZOPYRIDINE HCL 100 MG PO TABS: 100.0000 mg | ORAL_TABLET | Freq: Three times a day (TID) | ORAL | Status: DC

## 2016-10-23 MED ORDER — SODIUM CHLORIDE 0.9 % IV SOLN
1.0000 g | Freq: Once | INTRAVENOUS | Status: AC
  Administered 2016-10-23: 1 g via INTRAVENOUS
  Filled 2016-10-23: qty 10

## 2016-10-23 MED ORDER — FENTANYL CITRATE (PF) 100 MCG/2ML IJ SOLN
INTRAMUSCULAR | Status: AC | PRN
  Administered 2016-10-23: 50 ug via INTRAVENOUS

## 2016-10-23 MED ORDER — HYDROMORPHONE HCL 1 MG/ML IJ SOLN
0.5000 mg | INTRAMUSCULAR | Status: DC | PRN
  Filled 2016-10-23: qty 0.5

## 2016-10-23 MED ORDER — FENTANYL CITRATE (PF) 100 MCG/2ML IJ SOLN
INTRAMUSCULAR | Status: AC
  Filled 2016-10-23: qty 4

## 2016-10-23 MED ORDER — PHENAZOPYRIDINE HCL 100 MG PO TABS
100.0000 mg | ORAL_TABLET | Freq: Once | ORAL | Status: AC
  Administered 2016-10-23: 100 mg via ORAL
  Filled 2016-10-23: qty 1

## 2016-10-23 MED ORDER — LIDOCAINE HCL (PF) 1 % IJ SOLN
INTRAMUSCULAR | Status: AC
  Filled 2016-10-23: qty 30

## 2016-10-23 MED ORDER — LIDOCAINE HCL (PF) 1 % IJ SOLN
INTRAMUSCULAR | Status: AC | PRN
  Administered 2016-10-23: 10 mL

## 2016-10-23 NOTE — Progress Notes (Signed)
PROGRESS NOTE    ROCIO WOLAK  TIW:[id number] DOB: 29-Nov-1932 DOA: 10/22/2016 PCP: Leonard Downing, MD   Brief Narrative: 83 y.o. male with medical history significant of tachycardia, prostate cancer follows with urologist, dyslipidemia presented with worsening right flank pain for about one half month. Patient reported he was seen at outside hospital on July 4 when the CAT scan was done and patient was discharged home. On the day of admission the pain was worse in and associated with nausea vomiting and unable to urinate. In the ER patient was found to have serum creatinine level of 14 with bilateral hydroureteronephrosis. Unable to place the Foley catheter. Nephrology, urology consult obtained and patient admitted for further evaluation.  Assessment & Plan:   Active Problems:   AKI (acute kidney injury) ([place])  # Advanced acute kidney injury due to obstructive uropathy in the setting of moderate bilateral hydroureteronephrosis with nonobstructive right renal calculi: -Foley catheter placed by urologist with no output. Patient is in severe pain therefore ordered IV Dilaudid as needed for the pain management. Plan for bilateral percutaneous nephrostomy by interventional radiologist today. Monitor BMP, electrolytes. -Urology, nephrology and interventional radiology consult appreciated. -flomax  #Mild hyperkalemia with peaked T wave in V2: Medical management. Continue Patiromer.  #Right flank pain in the setting of hydronephrosis. Management as as above. Continue supportive care and pain management.  #History of sinus tachycardia/hypertension: Continue metoprolol at home dose. Continue to monitor blood pressure. Holding aspirin because is going for procedure.  DVT prophylaxis: SCD. No anticoagulant isn't because of planned procedure Code Status: Full code Family Communication: Patient's girlfriend bedside Disposition Plan: Likely discharge home in 1-2 days    Consultants:    Urology  Nephrology  Interventional radiology  Procedures: Foley catheter insertion Antimicrobials: Cipro on July 31 before procedure  Subjective: Seen and examined at bedside. Reports severe pain and cramping at foley catheter site. Denied fever and chills headache. Mild nausea but no vomiting. No chest pain or shortness of breath  Objective: Vitals:   10/22/16 2000 10/22/16 2026 10/23/16 0538 10/23/16 0838  BP: (!) 153/79 (!) 169/72 (!) 159/78 (!) 162/86  Pulse: 78 69 87 79  Resp: 19 20 19 20   Temp:  (!) 97.5 F (36.4 C) 97.8 F (36.6 C) 98.1 F (36.7 C)  TempSrc:  Oral Oral Oral  SpO2: 97%  97% 95%  Weight:  72.4 kg (159 lb 9.6 oz)    Height:  5\' 11"  (1.803 m)      Intake/Output Summary (Last 24 hours) at 10/23/16 1209 Last data filed at 10/23/16 0601  Gross per 24 hour  Intake                0 ml  Output               10 ml  Net              -10 ml   Filed Weights   10/22/16 2026  Weight: 72.4 kg (159 lb 9.6 oz)    Examination:  General exam: Looks uncomfortable because of pain Respiratory system: Clear to auscultation. Respiratory effort normal. No wheezing or crackle Cardiovascular system: S1 & S2 heard, RRR.  No pedal edema. Gastrointestinal system: Abdomen is nondistended, soft and nontender. Normal bowel sounds heard. Central nervous system: Alert and oriented. No focal neurological deficits. Extremities: Symmetric 5 x 5 power. Skin: No rashes, lesions or ulcers Foley catheter inserted.   Data Reviewed: I have personally reviewed following labs and  imaging studies  CBC:  Recent Labs Lab 10/22/16 1344 10/23/16 0358  WBC 7.4 8.0  NEUTROABS 6.1  --   HGB 13.2 11.8*  HCT 39.8 36.2*  MCV 88.1 88.5  PLT 268 371   Basic Metabolic Panel:  Recent Labs Lab 10/22/16 1344 10/23/16 0358 10/23/16 1036  NA 137 139 140  K 6.3* 6.3* 6.0*  CL 101 105 104  CO2 20* 19* 19*  GLUCOSE 104* 97 124*  BUN 94* 103* 104*  CREATININE 14.04* 14.29*  15.00*  CALCIUM 9.3 8.5* 8.6*   GFR: Estimated Creatinine Clearance: 3.9 mL/min (A) (by C-G formula based on SCr of 15 mg/dL (H)). Liver Function Tests:  Recent Labs Lab 10/22/16 1344  AST 17  ALT 17  ALKPHOS 89  BILITOT 0.8  PROT 7.7  ALBUMIN 3.8   No results for input(s): LIPASE, AMYLASE in the last 168 hours. No results for input(s): AMMONIA in the last 168 hours. Coagulation Profile:  Recent Labs Lab 10/23/16 0758  INR 1.22   Cardiac Enzymes: No results for input(s): CKTOTAL, CKMB, CKMBINDEX, TROPONINI in the last 168 hours. BNP (last 3 results) No results for input(s): PROBNP in the last 8760 hours. HbA1C: No results for input(s): HGBA1C in the last 72 hours. CBG: No results for input(s): GLUCAP in the last 168 hours. Lipid Profile: No results for input(s): CHOL, HDL, LDLCALC, TRIG, CHOLHDL, LDLDIRECT in the last 72 hours. Thyroid Function Tests: No results for input(s): TSH, T4TOTAL, FREET4, T3FREE, THYROIDAB in the last 72 hours. Anemia Panel: No results for input(s): VITAMINB12, FOLATE, FERRITIN, TIBC, IRON, RETICCTPCT in the last 72 hours. Sepsis Labs: No results for input(s): PROCALCITON, LATICACIDVEN in the last 168 hours.  No results found for this or any previous visit (from the past 240 hour(s)).       Radiology Studies: Ct Renal Stone Study  Result Date: 10/22/2016 CLINICAL DATA:  Left flank pain, history of renal stones, renal failure EXAM: CT ABDOMEN AND PELVIS WITHOUT CONTRAST TECHNIQUE: Multidetector CT imaging of the abdomen and pelvis was performed following the standard protocol without IV contrast. COMPARISON:  10/06/2016 FINDINGS: Lower chest: Trace bilateral pleural effusions. Hepatobiliary: 2.0 cm cyst adjacent to the caudate (series 3/ image 18). Status post cholecystectomy. No intrahepatic or extrahepatic ductal dilatation. Pancreas: Within normal limits. Spleen: Within normal limits. Adrenals/Urinary Tract: Adrenal glands are within  normal limits. Punctate nonobstructing right upper pole renal calculus (series 3/ image 28). 2 mm layering calculus in the distal right ureter (series 3/image 35). Possible layering punctate distal left ureteral calculus (series 3/image 37). Moderate bilateral hydroureteronephrosis. Thick-walled bladder, although exacerbated by underdistention. Stomach/Bowel: Stomach is notable for a small hiatal hernia. No evidence of bowel obstruction. Normal appendix (series 3/ image 54). Colonic diverticulosis, without evidence of diverticulitis. Vascular/Lymphatic: No evidence of abdominal aortic aneurysm. Atherosclerotic calcifications of the abdominal aorta and branch vessels. No suspicious abdominopelvic lymphadenopathy. Reproductive:  Mild prostatomegaly. Other: No abdominopelvic ascites. Small fat containing bilateral inguinal hernias (series 3/ image 81). Musculoskeletal: Visualized osseous structures are within normal limits. IMPRESSION: Moderate bilateral hydroureteronephrosis, mildly progressive. Bladder is thick-walled although underdistended. These findings are nonspecific but may reflect chronic bladder outlet obstruction. Punctate nonobstructing right upper pole renal calculus. 2 mm layering calculus the distal right ureter. Suspected layering punctate distal left ureteral calculus. Mild prostatomegaly. Additional ancillary findings as above. Electronically Signed   By: Julian Hy M.D.   On: 10/22/2016 16:10        Scheduled Meds: . metoprolol succinate  50  mg Oral Daily  . patiromer  8.4 g Oral Daily  . tamsulosin  0.4 mg Oral QPC breakfast   Continuous Infusions: . ciprofloxacin       LOS: 1 day    Nina Hoar Tanna Furry, MD [facility] Pager [phone]  If 7PM-7AM, please contact night-coverage [email or web address] Password [facility] 10/23/2016, 12:09 PM

## 2016-10-23 NOTE — Progress Notes (Signed)
Pt c/o severe penile pain 10/10 with loud intermittent moaning. Jonette Eva, NP notified. Orders received.

## 2016-10-23 NOTE — Progress Notes (Signed)
S/p b/l Perc NST tube placement. Repeat BMP with K 6.4 with worsening renal function.  Plan: -start NS 125 cc/hr -Kayexalate 30 g -D50 and insulin IV one dose only -calcium gluconate 1 g -repeat BMP in the evening.  d/w RN and pharmacist.

## 2016-10-23 NOTE — Procedures (Signed)
Interventional Radiology Procedure Note  Procedure: Placement of bilateral 41F PCNs.   Complications: None  Estimated Blood Loss: <25 mL  Recommendations: - Monitor output and renal function - Watch for signs of bleeding - Tube exchange in 8 weeks  Signed,  Criselda Peaches, MD

## 2016-10-23 NOTE — Consult Note (Signed)
Chief Complaint: Patient was seen in consultation today for flank pain  Referring Physician(s):  Dr. Louis Meckel  Supervising Physician: Jacqulynn Cadet  Patient Status: [facility] - In-pt  History of Present Illness: Randall Little is a 83 y.o. male with past medical history of dyslipidemia, Gleason 3+3 prostate cancer, and several weeks of flank pain presented to [facility] ED where he was found to have acute renal failure with Cr 14 and hyperkalemia.   CT Abd/Pelvis 10/22/16 showed: -Moderate bilateral hydroureteronephrosis, mildly progressive. Bladder is thick-walled although underdistended. These findings are nonspecific but may reflect chronic bladder outlet obstruction. -Punctate nonobstructing right upper pole renal calculus. 2 mm layering calculus the distal right ureter. Suspected layering punctate distal left ureteral calculus. -Mild prostatomegaly  IR consulted for bilateral nephrostomy placement at the request of Dr. Louis Meckel.   Patient has been NPO.  He is not currently on blood thinners.   Past Medical History:  Diagnosis Date  . Arrhythmia    Tachycardia  . Dyslipidemia   . SOB (shortness of breath)     History reviewed. No pertinent surgical history.  Allergies: Penicillin g  Medications: Prior to Admission medications   Medication Sig Start Date End Date Taking? Authorizing Provider  aspirin EC 81 MG tablet Take 81 mg by mouth daily.   Yes [provider]  metoprolol succinate (TOPROL-XL) 50 MG 24 hr tablet TAKE ONE TABLET BY MOUTH DAILY. TAKE WITH OR IMMEDIATELY FOLLOWING A MEAL 07/30/16  Yes Nahser, Wonda Cheng, MD  metroNIDAZOLE (METROCREAM) 0.75 % cream Apply 1 application topically daily. For rosacea 09/09/16  Yes [provider]  Multiple Vitamin (MULTIVITAMIN WITH MINERALS) TABS tablet Take 1 tablet by mouth daily.   Yes [provider]  oxyCODONE (OXY IR/ROXICODONE) 5 MG immediate release tablet Take 5 mg by mouth every  4 (four) hours as needed for severe pain.   Yes [provider]     Family History  Problem Relation Age of Onset  . Heart disease Mother   . Cancer Sister   . Cancer Sister     Social History   Social History  . Marital status: Divorced    Spouse name: N/A  . Number of children: N/A  . Years of education: N/A   Social History Main Topics  . Smoking status: Former Research scientist (life sciences)  . Smokeless tobacco: Never Used  . Alcohol use No  . Drug use: Unknown  . Sexual activity: Not Asked   Other Topics Concern  . None   Social History Narrative  . None    Review of Systems  Constitutional: Negative for fatigue and fever.  Respiratory: Negative for cough and shortness of breath.   Cardiovascular: Negative for chest pain.  Genitourinary: Positive for flank pain.  Psychiatric/Behavioral: Negative for behavioral problems and confusion.    Vital Signs: BP (!) 162/86 (BP Location: Left Arm)   Pulse 79   Temp 98.1 F (36.7 C) (Oral)   Resp 20   Ht 5\' 11"  (5.885 m)   Wt 159 lb 9.6 oz (72.4 kg)   SpO2 95%   BMI 22.26 kg/m   Physical Exam  Constitutional: He is oriented to person, place, and time. He appears well-developed.  Cardiovascular: Normal rate, regular rhythm and normal heart sounds.   Pulmonary/Chest: Effort normal and breath sounds normal. No respiratory distress.  Neurological: He is alert and oriented to person, place, and time.  Skin: Skin is warm and dry.  Psychiatric: He has a normal mood  and affect. His behavior is normal. Judgment and thought content normal.  Nursing note and vitals reviewed.   Mallampati Score:  MD Evaluation Airway: WNL Heart: WNL Abdomen: WNL Chest/ Lungs: WNL ASA  Classification: 3 Mallampati/Airway Score: Two  Imaging: Ct Renal Stone Study  Result Date: 10/22/2016 CLINICAL DATA:  Left flank pain, history of renal stones, renal failure EXAM: CT ABDOMEN AND PELVIS WITHOUT CONTRAST TECHNIQUE: Multidetector CT imaging of the  abdomen and pelvis was performed following the standard protocol without IV contrast. COMPARISON:  10/06/2016 FINDINGS: Lower chest: Trace bilateral pleural effusions. Hepatobiliary: 2.0 cm cyst adjacent to the caudate (series 3/ image 18). Status post cholecystectomy. No intrahepatic or extrahepatic ductal dilatation. Pancreas: Within normal limits. Spleen: Within normal limits. Adrenals/Urinary Tract: Adrenal glands are within normal limits. Punctate nonobstructing right upper pole renal calculus (series 3/ image 28). 2 mm layering calculus in the distal right ureter (series 3/image 35). Possible layering punctate distal left ureteral calculus (series 3/image 37). Moderate bilateral hydroureteronephrosis. Thick-walled bladder, although exacerbated by underdistention. Stomach/Bowel: Stomach is notable for a small hiatal hernia. No evidence of bowel obstruction. Normal appendix (series 3/ image 54). Colonic diverticulosis, without evidence of diverticulitis. Vascular/Lymphatic: No evidence of abdominal aortic aneurysm. Atherosclerotic calcifications of the abdominal aorta and branch vessels. No suspicious abdominopelvic lymphadenopathy. Reproductive:  Mild prostatomegaly. Other: No abdominopelvic ascites. Small fat containing bilateral inguinal hernias (series 3/ image 81). Musculoskeletal: Visualized osseous structures are within normal limits. IMPRESSION: Moderate bilateral hydroureteronephrosis, mildly progressive. Bladder is thick-walled although underdistended. These findings are nonspecific but may reflect chronic bladder outlet obstruction. Punctate nonobstructing right upper pole renal calculus. 2 mm layering calculus the distal right ureter. Suspected layering punctate distal left ureteral calculus. Mild prostatomegaly. Additional ancillary findings as above. Electronically Signed   By: Julian Hy M.D.   On: 10/22/2016 16:10    Labs:  CBC:  Recent Labs  10/22/16 1344 10/23/16 0358  WBC 7.4  8.0  HGB 13.2 11.8*  HCT 39.8 36.2*  PLT 268 254    COAGS:  Recent Labs  10/23/16 0758  INR 1.22  APTT 34    BMP:  Recent Labs  08/09/16 0956 10/22/16 1344 10/23/16 0358  NA 142 137 139  K 4.5 6.3* 6.3*  CL 103 101 105  CO2 27 20* 19*  GLUCOSE 98 104* 97  BUN 19 94* 103*  CALCIUM 9.1 9.3 8.5*  CREATININE 1.13 14.04* 14.29*  GFRNONAA 60 3* 3*  GFRAA 70 3* 3*    LIVER FUNCTION TESTS:  Recent Labs  08/09/16 0956 10/22/16 1344  BILITOT 0.5 0.8  AST 26 17  ALT 20 17  ALKPHOS 79 89  PROT 6.4 7.7  ALBUMIN 3.7 3.8    TUMOR MARKERS: No results for input(s): AFPTM, CEA, CA199, CHROMGRNA in the last 8760 hours.  Assessment and Plan: Patient with history of prostate cancer presented with several day history of flank pain.  CT Abd/Pelvis demonstrates bilateral moderate hydronephrosis.  Patient has been NPO.  He is not currently on blood thinners.  Case reviewed by Dr. Laurence Ferrari who feels patient is appropriate for procedure.  Will proceed with bilateral attempt as schedule allows.  Anticipate procedure today. Currently in a great deal of pain related to foley catheter.   Risks and benefits discussed with the patient including, but not limited to infection, bleeding, significant bleeding causing loss or decrease in renal function or damage to adjacent structures.  All of the patient's questions were answered, patient is agreeable to proceed. Consent  signed and in chart.  Thank you for this interesting consult.  I greatly enjoyed meeting CALLAGHAN LAVERDURE and look forward to participating in their care.  A copy of this report was sent to the requesting provider on this date.  Electronically Signed: Docia Barrier, PA 10/23/2016, 10:04 AM   I spent a total of 40 Minutes    in face to face in clinical consultation, greater than 50% of which was counseling/coordinating care for bilateral nephrostomy tubes.

## 2016-10-23 NOTE — Progress Notes (Signed)
Critical potassium of 6.3. M. Donnal Debar, NP notified. Will continue to monitor.

## 2016-10-23 NOTE — Progress Notes (Signed)
Admit: 10/22/2016 LOS: 1  65M with AKI 2/2 obstruction  Subjective:  Painful foley, urology planning PCN tubes  No significant UOP No improvement in renal function or hyperkalemia overnight after foley catheter placement  07/20 0701 - 07/21 0700 In: 0  Out: 10 [Urine:10]  Filed Weights   10/22/16 2026  Weight: 72.4 kg (159 lb 9.6 oz)    Scheduled Meds: . metoprolol succinate  50 mg Oral Daily  . patiromer  8.4 g Oral Daily  . tamsulosin  0.4 mg Oral QPC breakfast   Continuous Infusions: PRN Meds:.acetaminophen **OR** acetaminophen, ondansetron **OR** ondansetron (ZOFRAN) IV, opium-belladonna, oxyCODONE, polyethylene glycol  Current Labs: reviewed   Physical Exam:  Blood pressure (!) 159/78, pulse 87, temperature 97.8 F (36.6 C), temperature source Oral, resp. rate 19, height 5\' 11"  (1.803 m), weight 72.4 kg (159 lb 9.6 oz), SpO2 97 %. Appears very uncomfortable RRR, nl s1s2 CTAB No LEE Foley iin place No rashes Nonfocal, AAO x3,  A 1. AKI 2/2 obstruction, likely b/l ureteral obstruction from nephrolithiasis 2. Mild Hyperkalemia 2/2 #1 on patiromer; 7/20 EKG negative for peaked Ts, nl PRI, nl QRS 3. Nephrolithiasis  4. BPH 5. Hx/o prostate cancer  P 1. Agree with PCN 2. I hope with relief of obstruction renal function should begin to quickly improve 3. BID BMP   Pearson Grippe MD 10/23/2016, 8:31 AM   Recent Labs Lab 10/22/16 1344 10/23/16 0358  NA 137 139  K 6.3* 6.3*  CL 101 105  CO2 20* 19*  GLUCOSE 104* 97  BUN 94* 103*  CREATININE 14.04* 14.29*  CALCIUM 9.3 8.5*    Recent Labs Lab 10/22/16 1344 10/23/16 0358  WBC 7.4 8.0  NEUTROABS 6.1  --   HGB 13.2 11.8*  HCT 39.8 36.2*  MCV 88.1 88.5  PLT 268 254

## 2016-10-23 NOTE — Progress Notes (Signed)
Patient c/o penile burning sensation. Also urine output <10cc since foley placement. Attempted to page urology on call without success. Jonette Eva, NP notified. Orders received will continue to monitor.

## 2016-10-23 NOTE — Progress Notes (Signed)
Foley catheter discontinued, per order.  Jillyn Ledger, MBA, BSN, RN

## 2016-10-23 NOTE — Progress Notes (Signed)
CRITICAL VALUE ALERT  Critical Value:  K = 6.4 @ 15:44  Date & Time Notied:  10-23-2016, 16:52  Provider Notified: Carolin Sicks  Orders Received/Actions taken: Provider ordered Kayexalate, Insulin & D50.  Jillyn Ledger, MBA, BSN, RN

## 2016-10-24 LAB — CBC
HEMATOCRIT: 36 % — AB (ref 39.0–52.0)
HEMOGLOBIN: 11.8 g/dL — AB (ref 13.0–17.0)
MCH: 29 pg (ref 26.0–34.0)
MCHC: 32.8 g/dL (ref 30.0–36.0)
MCV: 88.5 fL (ref 78.0–100.0)
Platelets: 262 10*3/uL (ref 150–400)
RBC: 4.07 MIL/uL — ABNORMAL LOW (ref 4.22–5.81)
RDW: 13.4 % (ref 11.5–15.5)
WBC: 6.7 10*3/uL (ref 4.0–10.5)

## 2016-10-24 LAB — BASIC METABOLIC PANEL
ANION GAP: 12 (ref 5–15)
BUN: 69 mg/dL — ABNORMAL HIGH (ref 6–20)
CALCIUM: 8.6 mg/dL — AB (ref 8.9–10.3)
CHLORIDE: 111 mmol/L (ref 101–111)
CO2: 21 mmol/L — AB (ref 22–32)
Creatinine, Ser: 8.26 mg/dL — ABNORMAL HIGH (ref 0.61–1.24)
GFR calc non Af Amer: 5 mL/min — ABNORMAL LOW (ref >60)
GFR, EST AFRICAN AMERICAN: 6 mL/min — AB (ref >60)
Glucose, Bld: 121 mg/dL — ABNORMAL HIGH (ref 65–99)
Potassium: 4.1 mmol/L (ref 3.5–5.1)
SODIUM: 144 mmol/L (ref 135–145)

## 2016-10-24 MED ORDER — PROMETHAZINE HCL 25 MG/ML IJ SOLN
6.2500 mg | Freq: Four times a day (QID) | INTRAMUSCULAR | Status: DC | PRN
  Administered 2016-10-24: 6.25 mg via INTRAVENOUS
  Filled 2016-10-24: qty 1

## 2016-10-24 MED ORDER — ASPIRIN EC 81 MG PO TBEC
81.0000 mg | DELAYED_RELEASE_TABLET | Freq: Every day | ORAL | Status: DC
  Administered 2016-10-24 – 2016-10-25 (×2): 81 mg via ORAL
  Filled 2016-10-24 (×2): qty 1

## 2016-10-24 MED ORDER — SODIUM CHLORIDE 0.9% FLUSH: 5.0000 mL | Freq: Three times a day (TID) | INTRAVENOUS | Status: DC

## 2016-10-24 NOTE — Progress Notes (Signed)
Admit: 10/22/2016 LOS: 2  70M with AKI 2/2 obstruction  Subjective:  S/p b/l PCNs yesterday Good UOP from both sides Labs with significant improvement already after placement Still with significant N/V  07/21 0701 - 07/22 0700 In: 1903.3 [P.O.:360; I.V.:1433.3; IV Piggyback:110] Out: 3704 [Urine:3525]  Filed Weights   10/22/16 2026  Weight: 72.4 kg (159 lb 9.6 oz)    Scheduled Meds: . metoprolol succinate  50 mg Oral Daily  . sodium chloride flush  5 mL Intravenous Q8H  . tamsulosin  0.4 mg Oral QPC breakfast   Continuous Infusions: . sodium chloride 125 mL/hr at 10/23/16 1732   PRN Meds:.acetaminophen **OR** acetaminophen, HYDROmorphone (DILAUDID) injection, ondansetron **OR** ondansetron (ZOFRAN) IV, opium-belladonna, oxyCODONE, polyethylene glycol  Current Labs: reviewed   Physical Exam:  Blood pressure (!) 144/73, pulse 64, temperature 98 F (36.7 C), temperature source Oral, resp. rate 16, height 5\' 11"  (1.803 m), weight 72.4 kg (159 lb 9.6 oz), SpO2 96 %. Appears very uncomfortable RRR, nl s1s2 CTAB No LEE Foley iin place No rashes Nonfocal, AAO x3,  A 1. AKI 2/2 obstruction, likely b/l ureteral obstruction from nephrolithiasis s/p b/l PCNs 10/23/16 with IR; likely normal or near normal GFR 2. Mild Hyperkalemia 2/2 #1 resolved 3. Nephrolithiasis with likely ureteral obstruction b/l s/p PCNs, per urology 4. BPH 5. Hx/o prostate cancer  P 1. Appears to be quickly improving 2. Follow alogn to see if any residual more longstanding damage 3. Cont IVFs until tolerating PO, then stop   Pearson Grippe MD 10/24/2016, 8:35 AM   Recent Labs Lab 10/23/16 1544 10/23/16 1959 10/24/16 0307  NA 139 142 144  K 6.4* 5.2* 4.1  CL 104 105 111  CO2 20* 23 21*  GLUCOSE 126* 144* 121*  BUN 108* 94* 69*  CREATININE 15.44* 12.31* 8.26*  CALCIUM 8.4* 8.9 8.6*    Recent Labs Lab 10/22/16 1344 10/23/16 0358 10/24/16 0307  WBC 7.4 8.0 6.7  NEUTROABS 6.1  --   --    HGB 13.2 11.8* 11.8*  HCT 39.8 36.2* 36.0*  MCV 88.1 88.5 88.5  PLT 268 254 262

## 2016-10-24 NOTE — Consult Note (Signed)
Urology Consult Follow up Note    Requesting Attending Physician:  [name], MD Service Requesting Consult:  Hospitalist Service Providing Consult: Urology  Consulting Attending: Dr. Louis Meckel   Assessment:  Patient is a 83 y.o. male with history of low risk Gleason 3+3 prostate cancer presenting with several weeks of flank pain and LUTS, found to be in acute renal failure with a Cr 14 and hyperkalemia. CT scan demonstrating bilateral hydroureteronephrosis to the level of the bladder with a thickened bladder wall, consistent with likely bladder outlet obstruction being the cause of his acute renal failure. Interestingly, the bladder appears decompressed on imaging.   Additionally on CT,  bilateral distal ureteral stones are noted. Ureteral dilation appears to extend beyond the bilateral stones, arguing that these are non obstructing, but this cannot be excluded as a cause for his bilateral hydroureteronephrosis and kidney failure. The fact that patient was urinating and has now not voided for >12 hours argues that his stones are causing his bilateral hydroureteronephrosis.    Interval: Foley catheter placed, with no UOP on placement. Significant resistance through prostate. Cr continued to uptrend despite hydration and bladder decompression. Patient underwent bilateral PCN placement 7/21. Downtrending of Cr from 12.3 to 8.26, with normalization of hyperkalemia. Foley catheter causing significant bladder spasms and discomfort, removed after PCNs placed.   Recommendations: 1. Monitor for post obstructive diuresis with serial BMPs, strict I&Os 2. Continue bilateral PCNs to drainage 3. Daily forward flushing of PCNs to ensure patency, 10cc normal saline bilaterally q day  4. Will set up follow up with Dr. Linford Arnold to discuss management of bilateral obstruction ureteral stones, and further workup of bladder outlet obstruction.    Thank you for this consult. Urology will sign off.  Please contact the urology consult pager with any further questions/concerns.  [name], MD Urology Surgical Resident  Subjective:  Doing well, feeling much better this AM now that the catheter has been removed. Bilateral nephrostomy tubes not bothersome. No pain at insertion site.   Objective   Vital signs in last 24 hours: BP (!) 168/83 (BP Location: Left Arm)   Pulse 64   Temp 98.7 F (37.1 C) (Oral)   Resp 18   Ht 5\' 11"  (1.803 m)   Wt 72.4 kg (159 lb 9.6 oz)   SpO2 96%   BMI 22.26 kg/m   Intake/Output last 3 shifts: I/O last 3 completed shifts: In: 1903.3 [P.O.:360; I.V.:1433.3; IV Piggyback:110] Out: 9562 [Urine:3535]  Physical Exam General: NAD, A&O, resting, appropriate HEENT: [place]/AT, EOMI, MMM Pulmonary: Normal work of breathing on room air Cardiovascular: HDS, adequate peripheral perfusion Abdomen: soft, NTTP, nondistended, no suprapubic fullness or tenderness GU: bilateral nephrostomy tubes to drainage, clear yellow pink tinged thin urine in tubing bilaterally. No flank hematomas.  DRE: deferred Extremities: warm and well perfused, no edema Neuro: Appropriate, no focal neurological deficits  Most Recent Labs: Lab Results  Component Value Date   WBC 6.7 10/24/2016   HGB 11.8 (L) 10/24/2016   HCT 36.0 (L) 10/24/2016   PLT 262 10/24/2016    Lab Results  Component Value Date   NA 144 10/24/2016   K 4.1 10/24/2016   CL 111 10/24/2016   CO2 21 (L) 10/24/2016   BUN 69 (H) 10/24/2016   CREATININE 8.26 (H) 10/24/2016   CALCIUM 8.6 (L) 10/24/2016    Lab Results  Component Value Date   ALKPHOS 89 10/22/2016   BILITOT 0.8 10/22/2016   BILIDIR 0.1 06/05/2015   PROT 7.7 10/22/2016  ALBUMIN 3.8 10/22/2016   ALT 17 10/22/2016   AST 17 10/22/2016    Lab Results  Component Value Date   INR 1.22 10/23/2016   APTT 34 10/23/2016     Urine Culture: Pending    IMAGING: Ct Renal Stone Study  Result Date: 10/22/2016 CLINICAL DATA:  Left  flank pain, history of renal stones, renal failure EXAM: CT ABDOMEN AND PELVIS WITHOUT CONTRAST TECHNIQUE: Multidetector CT imaging of the abdomen and pelvis was performed following the standard protocol without IV contrast. COMPARISON:  10/06/2016 FINDINGS: Lower chest: Trace bilateral pleural effusions. Hepatobiliary: 2.0 cm cyst adjacent to the caudate (series 3/ image 18). Status post cholecystectomy. No intrahepatic or extrahepatic ductal dilatation. Pancreas: Within normal limits. Spleen: Within normal limits. Adrenals/Urinary Tract: Adrenal glands are within normal limits. Punctate nonobstructing right upper pole renal calculus (series 3/ image 28). 2 mm layering calculus in the distal right ureter (series 3/image 35). Possible layering punctate distal left ureteral calculus (series 3/image 37). Moderate bilateral hydroureteronephrosis. Thick-walled bladder, although exacerbated by underdistention. Stomach/Bowel: Stomach is notable for a small hiatal hernia. No evidence of bowel obstruction. Normal appendix (series 3/ image 54). Colonic diverticulosis, without evidence of diverticulitis. Vascular/Lymphatic: No evidence of abdominal aortic aneurysm. Atherosclerotic calcifications of the abdominal aorta and branch vessels. No suspicious abdominopelvic lymphadenopathy. Reproductive:  Mild prostatomegaly. Other: No abdominopelvic ascites. Small fat containing bilateral inguinal hernias (series 3/ image 81). Musculoskeletal: Visualized osseous structures are within normal limits. IMPRESSION: Moderate bilateral hydroureteronephrosis, mildly progressive. Bladder is thick-walled although underdistended. These findings are nonspecific but may reflect chronic bladder outlet obstruction. Punctate nonobstructing right upper pole renal calculus. 2 mm layering calculus the distal right ureter. Suspected layering punctate distal left ureteral calculus. Mild prostatomegaly. Additional ancillary findings as above.  Electronically Signed   By: Julian Hy M.D.   On: 10/22/2016 16:10   Ir Nephrostomy Placement Left  Result Date: 10/23/2016 INDICATION: 83 year old male with bladder outlet obstruction, bilateral hydronephrosis and acute renal failure. He requires urgent bilateral percutaneous nephrostomy tubes for renal function preservation. EXAM: IR NEPHROSTOMY PLACEMENT RIGHT; IR NEPHROSTOMY PLACEMENT LEFT COMPARISON:  CT abdomen/ pelvis 10/22/2016 MEDICATIONS: 400 mg ciprofloxacin; The antibiotic was administered in an appropriate time frame prior to skin puncture. ANESTHESIA/SEDATION: Fentanyl 1 mcg IV; Versed 50 mg IV Moderate Sedation Time:  15 The patient was continuously monitored during the procedure by the interventional radiology nurse under my direct supervision. CONTRAST:  30 mL Isovue-300 - administered into the collecting system(s) FLUOROSCOPY TIME:  Fluoroscopy Time: 1 minutes 48 seconds (46 mGy). COMPLICATIONS: None immediate. TECHNIQUE: The procedure, risks, benefits, and alternatives were explained to the patient. Questions regarding the procedure were encouraged and answered. The patient understands and consents to the procedure. The left flank was prepped with chlorhexidine in a sterile fashion, and a sterile drape was applied covering the operative field. A sterile gown and sterile gloves were used for the procedure. Local anesthesia was provided with 1% Lidocaine. The left flank was interrogated with ultrasound and the left kidney identified. The kidney is hydronephrotic. A suitable access site on the skin overlying the lower pole, posterior calix was identified. After local mg anesthesia was achieved, a small skin nick was made with an 11 blade scalpel. An 18 gauge needle was then advanced under direct sonographic guidance into the lower pole of the left kidney. Gentle hand injection of contrast material confirms placement of the needle within the renal collecting system. There is hydronephrosis.  A 0.035 wire was then advanced through the  needle and into the renal collecting system. The tract from the scan into the renal collecting system was then dilated serially to 10-French. A 10-French Cook all-purpose drain was then placed and positioned under fluoroscopic guidance. The locking loop is well formed within the left renal pelvis. The catheter was secured to the skin with 2-0 Prolene and a sterile bandage was placed. Catheter was left to gravity bag drainage. The right flank was prepped with chlorhexidine in a sterile fashion, and a sterile drape was applied covering the operative field. A sterile gown and sterile gloves were used for the procedure. Local anesthesia was provided with 1% Lidocaine. The right flank was interrogated with ultrasound and the left kidney identified. The kidney is hydronephrotic. A suitable access site on the skin overlying the lower pole, posterior calix was identified. After local mg anesthesia was achieved, a small skin nick was made with an 11 blade scalpel.An 18 gauge needle was then advanced under direct sonographic guidance into the lower pole of the left kidney. Gentle hand injection of contrast material confirms placement of the needle within the renal collecting system. There is hydronephrosis. A 0.035 wire was then advanced through the needle and into the renal collecting system. The tract from the scan into the renal collecting system was then dilated serially to 10-French. A 10-French Cook all-purpose drain was then placed and positioned under fluoroscopic guidance. The locking loop is well formed within the left renal pelvis. The catheter was secured to the skin with 2-0 Prolene and a sterile bandage was placed. Catheter was left to gravity bag drainage. IMPRESSION: Successful placement of a bilateral 10 French percutaneous nephrostomy tube. Electronically Signed   By: Jacqulynn Cadet M.D.   On: 10/23/2016 17:09   Ir Nephrostomy Placement Right  Result Date:  10/23/2016 INDICATION: 83 year old male with bladder outlet obstruction, bilateral hydronephrosis and acute renal failure. He requires urgent bilateral percutaneous nephrostomy tubes for renal function preservation. EXAM: IR NEPHROSTOMY PLACEMENT RIGHT; IR NEPHROSTOMY PLACEMENT LEFT COMPARISON:  CT abdomen/ pelvis 10/22/2016 MEDICATIONS: 400 mg ciprofloxacin; The antibiotic was administered in an appropriate time frame prior to skin puncture. ANESTHESIA/SEDATION: Fentanyl 1 mcg IV; Versed 50 mg IV Moderate Sedation Time:  15 The patient was continuously monitored during the procedure by the interventional radiology nurse under my direct supervision. CONTRAST:  30 mL Isovue-300 - administered into the collecting system(s) FLUOROSCOPY TIME:  Fluoroscopy Time: 1 minutes 48 seconds (46 mGy). COMPLICATIONS: None immediate. TECHNIQUE: The procedure, risks, benefits, and alternatives were explained to the patient. Questions regarding the procedure were encouraged and answered. The patient understands and consents to the procedure. The left flank was prepped with chlorhexidine in a sterile fashion, and a sterile drape was applied covering the operative field. A sterile gown and sterile gloves were used for the procedure. Local anesthesia was provided with 1% Lidocaine. The left flank was interrogated with ultrasound and the left kidney identified. The kidney is hydronephrotic. A suitable access site on the skin overlying the lower pole, posterior calix was identified. After local mg anesthesia was achieved, a small skin nick was made with an 11 blade scalpel. An 18 gauge needle was then advanced under direct sonographic guidance into the lower pole of the left kidney. Gentle hand injection of contrast material confirms placement of the needle within the renal collecting system. There is hydronephrosis. A 0.035 wire was then advanced through the needle and into the renal collecting system. The tract from the scan into the  renal collecting system was  then dilated serially to 10-French. A 10-French Cook all-purpose drain was then placed and positioned under fluoroscopic guidance. The locking loop is well formed within the left renal pelvis. The catheter was secured to the skin with 2-0 Prolene and a sterile bandage was placed. Catheter was left to gravity bag drainage. The right flank was prepped with chlorhexidine in a sterile fashion, and a sterile drape was applied covering the operative field. A sterile gown and sterile gloves were used for the procedure. Local anesthesia was provided with 1% Lidocaine. The right flank was interrogated with ultrasound and the left kidney identified. The kidney is hydronephrotic. A suitable access site on the skin overlying the lower pole, posterior calix was identified. After local mg anesthesia was achieved, a small skin nick was made with an 11 blade scalpel.An 18 gauge needle was then advanced under direct sonographic guidance into the lower pole of the left kidney. Gentle hand injection of contrast material confirms placement of the needle within the renal collecting system. There is hydronephrosis. A 0.035 wire was then advanced through the needle and into the renal collecting system. The tract from the scan into the renal collecting system was then dilated serially to 10-French. A 10-French Cook all-purpose drain was then placed and positioned under fluoroscopic guidance. The locking loop is well formed within the left renal pelvis. The catheter was secured to the skin with 2-0 Prolene and a sterile bandage was placed. Catheter was left to gravity bag drainage. IMPRESSION: Successful placement of a bilateral 10 French percutaneous nephrostomy tube. Electronically Signed   By: Jacqulynn Cadet M.D.   On: 10/23/2016 17:09

## 2016-10-24 NOTE — Progress Notes (Addendum)
PROGRESS NOTE    Randall Little  JME:[id number] DOB: [birth year] DOA: 10/22/2016 PCP: Leonard Downing, MD   Brief Narrative: 83 y.o. male with medical history significant of tachycardia, prostate cancer follows with urologist, dyslipidemia presented with worsening right flank pain for about one half month. Patient reported he was seen at outside hospital on July 4 when the CAT scan was done and patient was discharged home. On the day of admission the pain was worse in and associated with nausea vomiting and unable to urinate. In the ER patient was found to have serum creatinine level of 14 with bilateral hydroureteronephrosis. Unable to place the Foley catheter. Nephrology, urology consult obtained and patient admitted for further evaluation.  Assessment & Plan:   Active Problems:   AKI (acute kidney injury) ([place])  # Advanced acute kidney injury due to obstructive uropathy in the setting of moderate bilateral hydroureteronephrosis with nonobstructive right renal calculi: -Bilateral percutaneous nephrostomy tube placed by IR. Patient with increased urinary output and improvement in serum creatinine level. Continue IV fluid. Monitor BMP, urine output and lites closely. Patient is clinically improving. Both nephrology and urology consult appreciated. Follow up with urologist for further plan. -flomax  #Mild hyperkalemia with peaked T wave in V2: Status post medical management. Discontinue patiromer since serum potassium level improved. Monitor BMP.  #Right flank pain in the setting of hydronephrosis. Management as as above. Continue supportive care and pain management. Clinically improving.  #History of sinus tachycardia/hypertension: Continue metoprolol at home dose. Resume asprin. Monitor blood pressure. Pain management.  DVT prophylaxis: SCD.  Code Status: Full code Family Communication: Patient's girlfriend bedside Disposition Plan: Likely discharge home in 1-2  days    Consultants:   Urology  Nephrology  Interventional radiology  Procedures: Foley catheter removed, bilateral percutaneous nephrostomy tube placed on July 21. Antimicrobials: Cipro on July 31 before procedure  Subjective: Seen and examined at bedside. Feels nauseated but no vomiting headache chest pain or shortness of breath. Pain is improved.  Objective: Vitals:   10/23/16 1748 10/23/16 2159 10/24/16 0430 10/24/16 0900  BP: (!) 143/71 134/74 (!) 144/73 (!) 168/83  Pulse: 61 61 64 64  Resp: 16 16 16 18   Temp: 97.6 F (36.4 C) 98.3 F (36.8 C) 98 F (36.7 C) 98.7 F (37.1 C)  TempSrc: Oral Oral Oral Oral  SpO2: 95% 95% 96%   Weight:      Height:        Intake/Output Summary (Last 24 hours) at 10/24/16 1129 Last data filed at 10/24/16 2297  Gross per 24 hour  Intake          1903.33 ml  Output             3775 ml  Net         -1871.67 ml   Filed Weights   10/22/16 2026  Weight: 72.4 kg (159 lb 9.6 oz)    Examination:  General exam: Not in distress, lying on bed comfortable  Respiratory system: Clear bilateral. Respiratory effort normal. No wheezing or crackle Cardiovascular system: Regular rate rhythm S1 is normal. No pedal edema Gastrointestinal system: Abdomen soft, nontender nondistended. Bowel sound positive. Central nervous system: Alert awake and oriented. No focal neurological deficit. Extremities: Symmetric 5 x 5 power. Skin: No rashes, lesions or ulcers GU: Bilateral nephrostomy tube draining urine well. Foley catheter was removed   Data Reviewed: I have personally reviewed following labs and imaging studies  CBC:  Recent Labs Lab 10/22/16 1344 10/23/16 0358  10/24/16 0307  WBC 7.4 8.0 6.7  NEUTROABS 6.1  --   --   HGB 13.2 11.8* 11.8*  HCT 39.8 36.2* 36.0*  MCV 88.1 88.5 88.5  PLT 268 254 696   Basic Metabolic Panel:  Recent Labs Lab 10/23/16 0358 10/23/16 1036 10/23/16 1544 10/23/16 1959 10/24/16 0307  NA 139 140 139  142 144  K 6.3* 6.0* 6.4* 5.2* 4.1  CL 105 104 104 105 111  CO2 19* 19* 20* 23 21*  GLUCOSE 97 124* 126* 144* 121*  BUN 103* 104* 108* 94* 69*  CREATININE 14.29* 15.00* 15.44* 12.31* 8.26*  CALCIUM 8.5* 8.6* 8.4* 8.9 8.6*   GFR: Estimated Creatinine Clearance: 7.1 mL/min (A) (by C-G formula based on SCr of 8.26 mg/dL (H)). Liver Function Tests:  Recent Labs Lab 10/22/16 1344  AST 17  ALT 17  ALKPHOS 89  BILITOT 0.8  PROT 7.7  ALBUMIN 3.8   No results for input(s): LIPASE, AMYLASE in the last 168 hours. No results for input(s): AMMONIA in the last 168 hours. Coagulation Profile:  Recent Labs Lab 10/23/16 0758  INR 1.22   Cardiac Enzymes: No results for input(s): CKTOTAL, CKMB, CKMBINDEX, TROPONINI in the last 168 hours. BNP (last 3 results) No results for input(s): PROBNP in the last 8760 hours. HbA1C: No results for input(s): HGBA1C in the last 72 hours. CBG: No results for input(s): GLUCAP in the last 168 hours. Lipid Profile: No results for input(s): CHOL, HDL, LDLCALC, TRIG, CHOLHDL, LDLDIRECT in the last 72 hours. Thyroid Function Tests: No results for input(s): TSH, T4TOTAL, FREET4, T3FREE, THYROIDAB in the last 72 hours. Anemia Panel: No results for input(s): VITAMINB12, FOLATE, FERRITIN, TIBC, IRON, RETICCTPCT in the last 72 hours. Sepsis Labs: No results for input(s): PROCALCITON, LATICACIDVEN in the last 168 hours.  No results found for this or any previous visit (from the past 240 hour(s)).       Radiology Studies: Ct Renal Stone Study  Result Date: 10/22/2016 CLINICAL DATA:  Left flank pain, history of renal stones, renal failure EXAM: CT ABDOMEN AND PELVIS WITHOUT CONTRAST TECHNIQUE: Multidetector CT imaging of the abdomen and pelvis was performed following the standard protocol without IV contrast. COMPARISON:  10/06/2016 FINDINGS: Lower chest: Trace bilateral pleural effusions. Hepatobiliary: 2.0 cm cyst adjacent to the caudate (series 3/ image  18). Status post cholecystectomy. No intrahepatic or extrahepatic ductal dilatation. Pancreas: Within normal limits. Spleen: Within normal limits. Adrenals/Urinary Tract: Adrenal glands are within normal limits. Punctate nonobstructing right upper pole renal calculus (series 3/ image 28). 2 mm layering calculus in the distal right ureter (series 3/image 35). Possible layering punctate distal left ureteral calculus (series 3/image 37). Moderate bilateral hydroureteronephrosis. Thick-walled bladder, although exacerbated by underdistention. Stomach/Bowel: Stomach is notable for a small hiatal hernia. No evidence of bowel obstruction. Normal appendix (series 3/ image 54). Colonic diverticulosis, without evidence of diverticulitis. Vascular/Lymphatic: No evidence of abdominal aortic aneurysm. Atherosclerotic calcifications of the abdominal aorta and branch vessels. No suspicious abdominopelvic lymphadenopathy. Reproductive:  Mild prostatomegaly. Other: No abdominopelvic ascites. Small fat containing bilateral inguinal hernias (series 3/ image 81). Musculoskeletal: Visualized osseous structures are within normal limits. IMPRESSION: Moderate bilateral hydroureteronephrosis, mildly progressive. Bladder is thick-walled although underdistended. These findings are nonspecific but may reflect chronic bladder outlet obstruction. Punctate nonobstructing right upper pole renal calculus. 2 mm layering calculus the distal right ureter. Suspected layering punctate distal left ureteral calculus. Mild prostatomegaly. Additional ancillary findings as above. Electronically Signed   By: Henderson Newcomer.D.  On: 10/22/2016 16:10   Ir Nephrostomy Placement Left  Result Date: 10/23/2016 INDICATION: 83 year old male with bladder outlet obstruction, bilateral hydronephrosis and acute renal failure. He requires urgent bilateral percutaneous nephrostomy tubes for renal function preservation. EXAM: IR NEPHROSTOMY PLACEMENT RIGHT; IR  NEPHROSTOMY PLACEMENT LEFT COMPARISON:  CT abdomen/ pelvis 10/22/2016 MEDICATIONS: 400 mg ciprofloxacin; The antibiotic was administered in an appropriate time frame prior to skin puncture. ANESTHESIA/SEDATION: Fentanyl 1 mcg IV; Versed 50 mg IV Moderate Sedation Time:  15 The patient was continuously monitored during the procedure by the interventional radiology nurse under my direct supervision. CONTRAST:  30 mL Isovue-300 - administered into the collecting system(s) FLUOROSCOPY TIME:  Fluoroscopy Time: 1 minutes 48 seconds (46 mGy). COMPLICATIONS: None immediate. TECHNIQUE: The procedure, risks, benefits, and alternatives were explained to the patient. Questions regarding the procedure were encouraged and answered. The patient understands and consents to the procedure. The left flank was prepped with chlorhexidine in a sterile fashion, and a sterile drape was applied covering the operative field. A sterile gown and sterile gloves were used for the procedure. Local anesthesia was provided with 1% Lidocaine. The left flank was interrogated with ultrasound and the left kidney identified. The kidney is hydronephrotic. A suitable access site on the skin overlying the lower pole, posterior calix was identified. After local mg anesthesia was achieved, a small skin nick was made with an 11 blade scalpel. An 18 gauge needle was then advanced under direct sonographic guidance into the lower pole of the left kidney. Gentle hand injection of contrast material confirms placement of the needle within the renal collecting system. There is hydronephrosis. A 0.035 wire was then advanced through the needle and into the renal collecting system. The tract from the scan into the renal collecting system was then dilated serially to 10-French. A 10-French Cook all-purpose drain was then placed and positioned under fluoroscopic guidance. The locking loop is well formed within the left renal pelvis. The catheter was secured to the skin  with 2-0 Prolene and a sterile bandage was placed. Catheter was left to gravity bag drainage. The right flank was prepped with chlorhexidine in a sterile fashion, and a sterile drape was applied covering the operative field. A sterile gown and sterile gloves were used for the procedure. Local anesthesia was provided with 1% Lidocaine. The right flank was interrogated with ultrasound and the left kidney identified. The kidney is hydronephrotic. A suitable access site on the skin overlying the lower pole, posterior calix was identified. After local mg anesthesia was achieved, a small skin nick was made with an 11 blade scalpel.An 18 gauge needle was then advanced under direct sonographic guidance into the lower pole of the left kidney. Gentle hand injection of contrast material confirms placement of the needle within the renal collecting system. There is hydronephrosis. A 0.035 wire was then advanced through the needle and into the renal collecting system. The tract from the scan into the renal collecting system was then dilated serially to 10-French. A 10-French Cook all-purpose drain was then placed and positioned under fluoroscopic guidance. The locking loop is well formed within the left renal pelvis. The catheter was secured to the skin with 2-0 Prolene and a sterile bandage was placed. Catheter was left to gravity bag drainage. IMPRESSION: Successful placement of a bilateral 10 French percutaneous nephrostomy tube. Electronically Signed   By: Jacqulynn Cadet M.D.   On: 10/23/2016 17:09   Ir Nephrostomy Placement Right  Result Date: 10/23/2016 INDICATION: 83 year old male with bladder  outlet obstruction, bilateral hydronephrosis and acute renal failure. He requires urgent bilateral percutaneous nephrostomy tubes for renal function preservation. EXAM: IR NEPHROSTOMY PLACEMENT RIGHT; IR NEPHROSTOMY PLACEMENT LEFT COMPARISON:  CT abdomen/ pelvis 10/22/2016 MEDICATIONS: 400 mg ciprofloxacin; The antibiotic was  administered in an appropriate time frame prior to skin puncture. ANESTHESIA/SEDATION: Fentanyl 1 mcg IV; Versed 50 mg IV Moderate Sedation Time:  15 The patient was continuously monitored during the procedure by the interventional radiology nurse under my direct supervision. CONTRAST:  30 mL Isovue-300 - administered into the collecting system(s) FLUOROSCOPY TIME:  Fluoroscopy Time: 1 minutes 48 seconds (46 mGy). COMPLICATIONS: None immediate. TECHNIQUE: The procedure, risks, benefits, and alternatives were explained to the patient. Questions regarding the procedure were encouraged and answered. The patient understands and consents to the procedure. The left flank was prepped with chlorhexidine in a sterile fashion, and a sterile drape was applied covering the operative field. A sterile gown and sterile gloves were used for the procedure. Local anesthesia was provided with 1% Lidocaine. The left flank was interrogated with ultrasound and the left kidney identified. The kidney is hydronephrotic. A suitable access site on the skin overlying the lower pole, posterior calix was identified. After local mg anesthesia was achieved, a small skin nick was made with an 11 blade scalpel. An 18 gauge needle was then advanced under direct sonographic guidance into the lower pole of the left kidney. Gentle hand injection of contrast material confirms placement of the needle within the renal collecting system. There is hydronephrosis. A 0.035 wire was then advanced through the needle and into the renal collecting system. The tract from the scan into the renal collecting system was then dilated serially to 10-French. A 10-French Cook all-purpose drain was then placed and positioned under fluoroscopic guidance. The locking loop is well formed within the left renal pelvis. The catheter was secured to the skin with 2-0 Prolene and a sterile bandage was placed. Catheter was left to gravity bag drainage. The right flank was prepped  with chlorhexidine in a sterile fashion, and a sterile drape was applied covering the operative field. A sterile gown and sterile gloves were used for the procedure. Local anesthesia was provided with 1% Lidocaine. The right flank was interrogated with ultrasound and the left kidney identified. The kidney is hydronephrotic. A suitable access site on the skin overlying the lower pole, posterior calix was identified. After local mg anesthesia was achieved, a small skin nick was made with an 11 blade scalpel.An 18 gauge needle was then advanced under direct sonographic guidance into the lower pole of the left kidney. Gentle hand injection of contrast material confirms placement of the needle within the renal collecting system. There is hydronephrosis. A 0.035 wire was then advanced through the needle and into the renal collecting system. The tract from the scan into the renal collecting system was then dilated serially to 10-French. A 10-French Cook all-purpose drain was then placed and positioned under fluoroscopic guidance. The locking loop is well formed within the left renal pelvis. The catheter was secured to the skin with 2-0 Prolene and a sterile bandage was placed. Catheter was left to gravity bag drainage. IMPRESSION: Successful placement of a bilateral 10 French percutaneous nephrostomy tube. Electronically Signed   By: Jacqulynn Cadet M.D.   On: 10/23/2016 17:09        Scheduled Meds: . metoprolol succinate  50 mg Oral Daily  . sodium chloride flush  5 mL Intravenous Q8H  . tamsulosin  0.4 mg  Oral QPC breakfast   Continuous Infusions: . sodium chloride 125 mL/hr at 10/23/16 1732     LOS: 2 days    Dron Tanna Furry, MD [facility] Pager [phone]  If 7PM-7AM, please contact night-coverage [email or web address] Password [facility] 10/24/2016, 11:29 AM

## 2016-10-24 NOTE — Progress Notes (Signed)
Referring Physician(s):  Dr. Louis Meckel  Supervising Physician: Jacqulynn Cadet  Patient Status:  [facility] - In-pt  Chief Complaint:  Bilateral hydronephrosis s/p bilateral PCNs placed 7/21 by Dr. Laurence Ferrari  Subjective: Patient much more comfortable today.  Complains of nausea.  Allergies: Penicillin g  Medications: Prior to Admission medications   Medication Sig Start Date End Date Taking? Authorizing Provider  aspirin EC 81 MG tablet Take 81 mg by mouth daily.   Yes [provider]  metoprolol succinate (TOPROL-XL) 50 MG 24 hr tablet TAKE ONE TABLET BY MOUTH DAILY. TAKE WITH OR IMMEDIATELY FOLLOWING A MEAL 07/30/16  Yes Nahser, Wonda Cheng, MD  metroNIDAZOLE (METROCREAM) 0.75 % cream Apply 1 application topically daily. For rosacea 09/09/16  Yes [provider]  Multiple Vitamin (MULTIVITAMIN WITH MINERALS) TABS tablet Take 1 tablet by mouth daily.   Yes [provider]  oxyCODONE (OXY IR/ROXICODONE) 5 MG immediate release tablet Take 5 mg by mouth every 4 (four) hours as needed for severe pain.   Yes [provider]     Vital Signs: BP (!) 168/83 (BP Location: Left Arm)   Pulse 64   Temp 98.7 F (37.1 C) (Oral)   Resp 18   Ht 5\' 11"  (1.803 m)   Wt 159 lb 9.6 oz (72.4 kg)   SpO2 96%   BMI 22.26 kg/m   Physical Exam  NAD, alert Skin:  Both right and left PCNs in place.  Right with blood-tinged urine, left with clear yellow urine.  Insertion sites clean and intact, no erythema or warmth.  Imaging: Ct Renal Stone Study  Result Date: 10/22/2016 CLINICAL DATA:  Left flank pain, history of renal stones, renal failure EXAM: CT ABDOMEN AND PELVIS WITHOUT CONTRAST TECHNIQUE: Multidetector CT imaging of the abdomen and pelvis was performed following the standard protocol without IV contrast. COMPARISON:  10/06/2016 FINDINGS: Lower chest: Trace bilateral pleural effusions. Hepatobiliary: 2.0 cm cyst adjacent to the caudate (series 3/ image  18). Status post cholecystectomy. No intrahepatic or extrahepatic ductal dilatation. Pancreas: Within normal limits. Spleen: Within normal limits. Adrenals/Urinary Tract: Adrenal glands are within normal limits. Punctate nonobstructing right upper pole renal calculus (series 3/ image 28). 2 mm layering calculus in the distal right ureter (series 3/image 35). Possible layering punctate distal left ureteral calculus (series 3/image 37). Moderate bilateral hydroureteronephrosis. Thick-walled bladder, although exacerbated by underdistention. Stomach/Bowel: Stomach is notable for a small hiatal hernia. No evidence of bowel obstruction. Normal appendix (series 3/ image 54). Colonic diverticulosis, without evidence of diverticulitis. Vascular/Lymphatic: No evidence of abdominal aortic aneurysm. Atherosclerotic calcifications of the abdominal aorta and branch vessels. No suspicious abdominopelvic lymphadenopathy. Reproductive:  Mild prostatomegaly. Other: No abdominopelvic ascites. Small fat containing bilateral inguinal hernias (series 3/ image 81). Musculoskeletal: Visualized osseous structures are within normal limits. IMPRESSION: Moderate bilateral hydroureteronephrosis, mildly progressive. Bladder is thick-walled although underdistended. These findings are nonspecific but may reflect chronic bladder outlet obstruction. Punctate nonobstructing right upper pole renal calculus. 2 mm layering calculus the distal right ureter. Suspected layering punctate distal left ureteral calculus. Mild prostatomegaly. Additional ancillary findings as above. Electronically Signed   By: Julian Hy M.D.   On: 10/22/2016 16:10   Ir Nephrostomy Placement Left  Result Date: 10/23/2016 INDICATION: 83 year old male with bladder outlet obstruction, bilateral hydronephrosis and acute renal failure. He requires urgent bilateral percutaneous nephrostomy tubes for renal function preservation. EXAM: IR NEPHROSTOMY PLACEMENT RIGHT; IR  NEPHROSTOMY PLACEMENT LEFT COMPARISON:  CT abdomen/ pelvis 10/22/2016 MEDICATIONS: 400 mg ciprofloxacin; The  antibiotic was administered in an appropriate time frame prior to skin puncture. ANESTHESIA/SEDATION: Fentanyl 1 mcg IV; Versed 50 mg IV Moderate Sedation Time:  15 The patient was continuously monitored during the procedure by the interventional radiology nurse under my direct supervision. CONTRAST:  30 mL Isovue-300 - administered into the collecting system(s) FLUOROSCOPY TIME:  Fluoroscopy Time: 1 minutes 48 seconds (46 mGy). COMPLICATIONS: None immediate. TECHNIQUE: The procedure, risks, benefits, and alternatives were explained to the patient. Questions regarding the procedure were encouraged and answered. The patient understands and consents to the procedure. The left flank was prepped with chlorhexidine in a sterile fashion, and a sterile drape was applied covering the operative field. A sterile gown and sterile gloves were used for the procedure. Local anesthesia was provided with 1% Lidocaine. The left flank was interrogated with ultrasound and the left kidney identified. The kidney is hydronephrotic. A suitable access site on the skin overlying the lower pole, posterior calix was identified. After local mg anesthesia was achieved, a small skin nick was made with an 11 blade scalpel. An 18 gauge needle was then advanced under direct sonographic guidance into the lower pole of the left kidney. Gentle hand injection of contrast material confirms placement of the needle within the renal collecting system. There is hydronephrosis. A 0.035 wire was then advanced through the needle and into the renal collecting system. The tract from the scan into the renal collecting system was then dilated serially to 10-French. A 10-French Cook all-purpose drain was then placed and positioned under fluoroscopic guidance. The locking loop is well formed within the left renal pelvis. The catheter was secured to the skin  with 2-0 Prolene and a sterile bandage was placed. Catheter was left to gravity bag drainage. The right flank was prepped with chlorhexidine in a sterile fashion, and a sterile drape was applied covering the operative field. A sterile gown and sterile gloves were used for the procedure. Local anesthesia was provided with 1% Lidocaine. The right flank was interrogated with ultrasound and the left kidney identified. The kidney is hydronephrotic. A suitable access site on the skin overlying the lower pole, posterior calix was identified. After local mg anesthesia was achieved, a small skin nick was made with an 11 blade scalpel.An 18 gauge needle was then advanced under direct sonographic guidance into the lower pole of the left kidney. Gentle hand injection of contrast material confirms placement of the needle within the renal collecting system. There is hydronephrosis. A 0.035 wire was then advanced through the needle and into the renal collecting system. The tract from the scan into the renal collecting system was then dilated serially to 10-French. A 10-French Cook all-purpose drain was then placed and positioned under fluoroscopic guidance. The locking loop is well formed within the left renal pelvis. The catheter was secured to the skin with 2-0 Prolene and a sterile bandage was placed. Catheter was left to gravity bag drainage. IMPRESSION: Successful placement of a bilateral 10 French percutaneous nephrostomy tube. Electronically Signed   By: Jacqulynn Cadet M.D.   On: 10/23/2016 17:09   Ir Nephrostomy Placement Right  Result Date: 10/23/2016 INDICATION: 83 year old male with bladder outlet obstruction, bilateral hydronephrosis and acute renal failure. He requires urgent bilateral percutaneous nephrostomy tubes for renal function preservation. EXAM: IR NEPHROSTOMY PLACEMENT RIGHT; IR NEPHROSTOMY PLACEMENT LEFT COMPARISON:  CT abdomen/ pelvis 10/22/2016 MEDICATIONS: 400 mg ciprofloxacin; The antibiotic was  administered in an appropriate time frame prior to skin puncture. ANESTHESIA/SEDATION: Fentanyl 1 mcg IV; Versed  50 mg IV Moderate Sedation Time:  15 The patient was continuously monitored during the procedure by the interventional radiology nurse under my direct supervision. CONTRAST:  30 mL Isovue-300 - administered into the collecting system(s) FLUOROSCOPY TIME:  Fluoroscopy Time: 1 minutes 48 seconds (46 mGy). COMPLICATIONS: None immediate. TECHNIQUE: The procedure, risks, benefits, and alternatives were explained to the patient. Questions regarding the procedure were encouraged and answered. The patient understands and consents to the procedure. The left flank was prepped with chlorhexidine in a sterile fashion, and a sterile drape was applied covering the operative field. A sterile gown and sterile gloves were used for the procedure. Local anesthesia was provided with 1% Lidocaine. The left flank was interrogated with ultrasound and the left kidney identified. The kidney is hydronephrotic. A suitable access site on the skin overlying the lower pole, posterior calix was identified. After local mg anesthesia was achieved, a small skin nick was made with an 11 blade scalpel. An 18 gauge needle was then advanced under direct sonographic guidance into the lower pole of the left kidney. Gentle hand injection of contrast material confirms placement of the needle within the renal collecting system. There is hydronephrosis. A 0.035 wire was then advanced through the needle and into the renal collecting system. The tract from the scan into the renal collecting system was then dilated serially to 10-French. A 10-French Cook all-purpose drain was then placed and positioned under fluoroscopic guidance. The locking loop is well formed within the left renal pelvis. The catheter was secured to the skin with 2-0 Prolene and a sterile bandage was placed. Catheter was left to gravity bag drainage. The right flank was prepped  with chlorhexidine in a sterile fashion, and a sterile drape was applied covering the operative field. A sterile gown and sterile gloves were used for the procedure. Local anesthesia was provided with 1% Lidocaine. The right flank was interrogated with ultrasound and the left kidney identified. The kidney is hydronephrotic. A suitable access site on the skin overlying the lower pole, posterior calix was identified. After local mg anesthesia was achieved, a small skin nick was made with an 11 blade scalpel.An 18 gauge needle was then advanced under direct sonographic guidance into the lower pole of the left kidney. Gentle hand injection of contrast material confirms placement of the needle within the renal collecting system. There is hydronephrosis. A 0.035 wire was then advanced through the needle and into the renal collecting system. The tract from the scan into the renal collecting system was then dilated serially to 10-French. A 10-French Cook all-purpose drain was then placed and positioned under fluoroscopic guidance. The locking loop is well formed within the left renal pelvis. The catheter was secured to the skin with 2-0 Prolene and a sterile bandage was placed. Catheter was left to gravity bag drainage. IMPRESSION: Successful placement of a bilateral 10 French percutaneous nephrostomy tube. Electronically Signed   By: Jacqulynn Cadet M.D.   On: 10/23/2016 17:09    Labs:  CBC:  Recent Labs  10/22/16 1344 10/23/16 0358 10/24/16 0307  WBC 7.4 8.0 6.7  HGB 13.2 11.8* 11.8*  HCT 39.8 36.2* 36.0*  PLT 268 254 262    COAGS:  Recent Labs  10/23/16 0758  INR 1.22  APTT 34    BMP:  Recent Labs  10/23/16 1036 10/23/16 1544 10/23/16 1959 10/24/16 0307  NA 140 139 142 144  K 6.0* 6.4* 5.2* 4.1  CL 104 104 105 111  CO2 19* 20* 23  21*  GLUCOSE 124* 126* 144* 121*  BUN 104* 108* 94* 69*  CALCIUM 8.6* 8.4* 8.9 8.6*  CREATININE 15.00* 15.44* 12.31* 8.26*  GFRNONAA 3* 2* 3* 5*    GFRAA 3* 3* 4* 6*    LIVER FUNCTION TESTS:  Recent Labs  08/09/16 0956 10/22/16 1344  BILITOT 0.5 0.8  AST 26 17  ALT 20 17  ALKPHOS 79 89  PROT 6.4 7.7  ALBUMIN 3.7 3.8    Assessment and Plan: Bilateral hydronephrosis s/p bilateral PCNs placed 7/21 by Dr. Laurence Ferrari Patient improved today.   K WNL, SCr improved to 8.26. Complains of intermittent nausea, but is otherwise feeling better.  PCNs in place with good output.  Plans per Urology.  IR to follow.   Electronically Signed: Docia Barrier, PA 10/24/2016, 11:38 AM   I spent a total of 15 Minutes at the the patient's bedside AND on the patient's hospital floor or unit, greater than 50% of which was counseling/coordinating care for bilateral hydronephrosis.

## 2016-10-25 LAB — BASIC METABOLIC PANEL
ANION GAP: 7 (ref 5–15)
BUN: 28 mg/dL — ABNORMAL HIGH (ref 6–20)
CHLORIDE: 113 mmol/L — AB (ref 101–111)
CO2: 26 mmol/L (ref 22–32)
Calcium: 8.1 mg/dL — ABNORMAL LOW (ref 8.9–10.3)
Creatinine, Ser: 2.57 mg/dL — ABNORMAL HIGH (ref 0.61–1.24)
GFR calc non Af Amer: 22 mL/min — ABNORMAL LOW (ref >60)
GFR, EST AFRICAN AMERICAN: 25 mL/min — AB (ref >60)
GLUCOSE: 111 mg/dL — AB (ref 65–99)
POTASSIUM: 3.6 mmol/L (ref 3.5–5.1)
Sodium: 146 mmol/L — ABNORMAL HIGH (ref 135–145)

## 2016-10-25 NOTE — Care Management Note (Signed)
Case Management Note  Patient Details  Name: Randall Little MRN: [id number] Date of Birth: 09-21-1932  Subjective/Objective:       CM following for progression and d/c planning.              Action/Plan: 10/25/2016 Pt will need HHRN for instruction and supplies for nephrostomy tube flushes. Pt is unable to reach tubes, therefore pt RN, Genelle Bal will instruct pt significant other Koven Belinsky on technique for flushing these tubes and will provide flushes until [facility] makes a visit in the home. [facility] will provide [facility] and will contact pt at home to arrange an appointment. Pt street address is:  [address] [place], [place] [address]  Ph  [phone]  IHDT : [phone] Significant other Frederico Gerling  Expected Discharge Date:  10/25/16               Expected Discharge Plan:  [place]  In-House Referral:  NA  Discharge planning Services  CM Consult  Post Acute Care Choice:  Home Health Choice offered to:  Patient  DME Arranged:  N/A DME Agency:  NA  HH Arranged:  RN [place] Agency:  [facility] (now [facility])  Status of Service:  Completed, signed off  If discussed at [employer], dates discussed:    Additional Comments:  Adron Bene, RN 10/25/2016, 3:16 PM

## 2016-10-25 NOTE — Discharge Summary (Signed)
Physician Discharge Summary  Randall Little OXB:[id number] DOB: 12/30/1932 DOA: 10/22/2016  PCP: Leonard Downing, MD  Admit date: 10/22/2016 Discharge date: 10/25/2016  Admitted From:home Disposition:home  Recommendations for Outpatient Follow-up:  1. Follow up with PCP and Urologist in 1-2 weeks 2. Please obtain BMP/CBC in one week 3.  Home Health: yes/ RN Equipment/Devices: Nephrostomy tube Discharge Condition: Stable CODE STATUS: Full code Diet recommendation: Heart healthy  Brief/Interim Summary: 83 y.o.malewith medical history significant of tachycardia, prostate cancer follows with urologist, dyslipidemia presented with worsening right flank pain for about one half month. Patient reported he was seen at outside hospital on July 4 when the CAT scan was done and patient was discharged home. On the day of admission the pain was worse in and associated with nausea vomiting and unable to urinate. In the ER patient was found to have serum creatinine level of 14 with bilateral hydroureteronephrosis.  Nephrology, urology consult obtained and patient admitted for further evaluation.  # Advanced acute kidney injury due to obstructive uropathy in the setting of moderate bilateral hydroureteronephrosis with nonobstructive right renal calculi: -Bilateral percutaneous nephrostomy tube placed by IR. Significant improvement in urine output and serum creatinine level. Serum creatinine level improved from 15-2.5 in 2 days. Evaluated by both nephrologist and urologist. They recommended outpatient follow-up. Patient will be discharged with bilateral nephrostomy tube and home care nurse. He denied fever, chills, headache, dizziness, chest pain, back pain. He feels good. I recommended patient to check repeat lab within a week with PCP or urologist. he verbalized understanding.  #Mild hyperkalemia with peaked T wave in V2: Status post medical management. Serum potassium level improved.  #Right  flank pain in the setting of hydronephrosis. Pain improved.  #History of sinus tachycardia/hypertension: Continue metoprolol and aspirin. Recommended to follow up with PCP.  Discharge Diagnoses:  Active Problems:   AKI (acute kidney injury) [facility])    Discharge Instructions  Discharge Instructions    Call MD for:  difficulty breathing, headache or visual disturbances    Complete by:  As directed    Call MD for:  extreme fatigue    Complete by:  As directed    Call MD for:  hives    Complete by:  As directed    Call MD for:  persistant dizziness or light-headedness    Complete by:  As directed    Call MD for:  persistant nausea and vomiting    Complete by:  As directed    Call MD for:  severe uncontrolled pain    Complete by:  As directed    Call MD for:  temperature >100.4    Complete by:  As directed    Diet - low sodium heart healthy    Complete by:  As directed    Discharge instructions    Complete by:  As directed    Please check lab for CBC and BMP in a week with your PCP or urologist. Please follow up with your urologist in 1-2 weeks to discuss about the nephrostomy tube and further management. Please contact your doctor if you have Fever, back pain or any new symptoms.   Increase activity slowly    Complete by:  As directed      Allergies as of 10/25/2016      Reactions   Penicillin G Rash   Has patient had a PCN reaction causing immediate rash, facial/tongue/throat swelling, SOB or lightheadedness with hypotension: Yes Has patient had a PCN reaction causing severe rash involving  mucus membranes or skin necrosis: No Has patient had a PCN reaction that required hospitalization: No Has patient had a PCN reaction occurring within the last 10 years: Yes If all of the above answers are "NO", then may proceed with Cephalosporin use.      Medication List    TAKE these medications   aspirin EC 81 MG tablet Take 81 mg by mouth daily.   metoprolol succinate 50 MG 24 hr  tablet Commonly known as:  TOPROL-XL TAKE ONE TABLET BY MOUTH DAILY. TAKE WITH OR IMMEDIATELY FOLLOWING A MEAL   metroNIDAZOLE 0.75 % cream Commonly known as:  METROCREAM Apply 1 application topically daily. For rosacea   multivitamin with minerals Tabs tablet Take 1 tablet by mouth daily.   oxyCODONE 5 MG immediate release tablet Commonly known as:  Oxy IR/ROXICODONE Take 5 mg by mouth every 4 (four) hours as needed for severe pain.      Follow-up Information    Franchot Gallo, MD. Call in 1 day(s).   Specialty:  Urology Why:  To make an appointment to see him or an APP in the coming 1-2 weeks.  Contact information: [place] [address] [phone]        Leonard Downing, MD. Schedule an appointment as soon as possible for a visit in 1 week(s).   Specialty:  Family Medicine Contact information: [place] [address] [phone]        Jacqulynn Cadet, MD Follow up.   Specialties:  Interventional Radiology, Radiology Why:  for tube related issue Contact information: [address] [place] [address] [phone]          Allergies  Allergen Reactions  . Penicillin G Rash    Has patient had a PCN reaction causing immediate rash, facial/tongue/throat swelling, SOB or lightheadedness with hypotension: Yes Has patient had a PCN reaction causing severe rash involving mucus membranes or skin necrosis: No Has patient had a PCN reaction that required hospitalization: No Has patient had a PCN reaction occurring within the last 10 years: Yes If all of the above answers are "NO", then may proceed with Cephalosporin use.    Consultations: Urologist Nephrologist Interventional radiology  Procedures/Studies: Bilateral nephrostomy tube placement  Subjective: Seen and examined at bedside. Denied headache, dizziness, nausea vomiting chest pain or shortness of breath.  Discharge Exam: Vitals:    10/24/16 1934 10/25/16 0925  BP: 137/78 (!) 144/67  Pulse: 75 65  Resp: 19 18  Temp: 98.8 F (37.1 C) 98 F (36.7 C)   Vitals:   10/24/16 0900 10/24/16 1715 10/24/16 1934 10/25/16 0925  BP: (!) 168/83 (!) 143/70 137/78 (!) 144/67  Pulse: 64 73 75 65  Resp: 18 18 19 18   Temp: 98.7 F (37.1 C) 98.5 F (36.9 C) 98.8 F (37.1 C) 98 F (36.7 C)  TempSrc: Oral Oral Oral Oral  SpO2:   91% 94%  Weight:   72.1 kg (158 lb 15.2 oz)   Height:        General: Pt is alert, awake, not in acute distress Cardiovascular: RRR, S1/S2 +, no rubs, no gallops Respiratory: CTA bilaterally, no wheezing, no rhonchi Abdominal: Soft, NT, ND, bowel sounds + Extremities: no edema, no cyanosis Bilateral nephrostomy tube draining clear urine.   The results of significant diagnostics from this hospitalization (including imaging, microbiology, ancillary and laboratory) are listed below for reference.     Microbiology: No results found for this or any previous  visit (from the past 240 hour(s)).   Labs: BNP (last 3 results) No results for input(s): BNP in the last 8760 hours. Basic Metabolic Panel:  Recent Labs Lab 10/23/16 1036 10/23/16 1544 10/23/16 1959 10/24/16 0307 10/25/16 0526  NA 140 139 142 144 146*  K 6.0* 6.4* 5.2* 4.1 3.6  CL 104 104 105 111 113*  CO2 19* 20* 23 21* 26  GLUCOSE 124* 126* 144* 121* 111*  BUN 104* 108* 94* 69* 28*  CREATININE 15.00* 15.44* 12.31* 8.26* 2.57*  CALCIUM 8.6* 8.4* 8.9 8.6* 8.1*   Liver Function Tests:  Recent Labs Lab 10/22/16 1344  AST 17  ALT 17  ALKPHOS 89  BILITOT 0.8  PROT 7.7  ALBUMIN 3.8   No results for input(s): LIPASE, AMYLASE in the last 168 hours. No results for input(s): AMMONIA in the last 168 hours. CBC:  Recent Labs Lab 10/22/16 1344 10/23/16 0358 10/24/16 0307  WBC 7.4 8.0 6.7  NEUTROABS 6.1  --   --   HGB 13.2 11.8* 11.8*  HCT 39.8 36.2* 36.0*  MCV 88.1 88.5 88.5  PLT 268 254 262   Cardiac Enzymes: No  results for input(s): CKTOTAL, CKMB, CKMBINDEX, TROPONINI in the last 168 hours. BNP: Invalid input(s): POCBNP CBG: No results for input(s): GLUCAP in the last 168 hours. D-Dimer No results for input(s): DDIMER in the last 72 hours. Hgb A1c No results for input(s): HGBA1C in the last 72 hours. Lipid Profile No results for input(s): CHOL, HDL, LDLCALC, TRIG, CHOLHDL, LDLDIRECT in the last 72 hours. Thyroid function studies No results for input(s): TSH, T4TOTAL, T3FREE, THYROIDAB in the last 72 hours.  Invalid input(s): FREET3 Anemia work up No results for input(s): VITAMINB12, FOLATE, FERRITIN, TIBC, IRON, RETICCTPCT in the last 72 hours. Urinalysis No results found for: COLORURINE, APPEARANCEUR, LABSPEC, [place], GLUCOSEU, HGBUR, BILIRUBINUR, KETONESUR, PROTEINUR, UROBILINOGEN, NITRITE, LEUKOCYTESUR Sepsis Labs Invalid input(s): PROCALCITONIN,  WBC,  LACTICIDVEN Microbiology No results found for this or any previous visit (from the past 240 hour(s)).   Time coordinating discharge: 32 minutes  SIGNED:   [name], MD  [facility] 10/25/2016, 1:03 PM  If 7PM-7AM, please contact night-coverage [email or web address] Password [email or web address]

## 2016-10-25 NOTE — Discharge Instructions (Signed)
[facility] , home care for home health RN to assist with care of nephrostomy tubes.

## 2016-10-25 NOTE — Progress Notes (Signed)
Admit: 10/22/2016 LOS: 3  Mr. Randall Little is an 83yo male with AKI 2/2 obstruction s/p bilateral nephrostomy.  Subjective:  Patient with no complaints this morning. Continues to have good urine output.  07/22 0701 - 07/23 0700 In: 1268.8 [I.V.:1268.8] Out: 2800 [Urine:2800]  Filed Weights   10/22/16 2026 10/24/16 1934  Weight: 159 lb 9.6 oz (72.4 kg) 158 lb 15.2 oz (72.1 kg)    Scheduled Meds: . aspirin EC  81 mg Oral Daily  . metoprolol succinate  50 mg Oral Daily  . sodium chloride flush  5 mL Intravenous Q8H  . tamsulosin  0.4 mg Oral QPC breakfast   Continuous Infusions: . sodium chloride 125 mL/hr at 10/24/16 1334   PRN Meds:.acetaminophen **OR** acetaminophen, HYDROmorphone (DILAUDID) injection, ondansetron **OR** ondansetron (ZOFRAN) IV, opium-belladonna, oxyCODONE, polyethylene glycol, promethazine  Current Labs: reviewed - Na 146, K 2.6, Cl 113, bicarb 26, BUN 28, Cr 2.57   Physical Exam:  Blood pressure (!) 144/67, pulse 65, temperature 98 F (36.7 C), temperature source Oral, resp. rate 18, height 5\' 11"  (1.803 m), weight 158 lb 15.2 oz (72.1 kg), SpO2 94 %. Constitutional: NAD, pleasant CV: RRR, no murmurs, rubs or gallops appreciated Resp: CTAB, no increased work of breathing Abd: Soft, NDNT, +BS Ext: no edema  A 1. AKI 2/2 obstruction - s/p bilateral nephrostomy tubes - quickly resolving with great urine output. Cr 2.6 this morning (b/l is normal or near normal) 2. Nephrolithiasis with likely ureteral obstruction s/p bilateral nephrostomy tubes - urology signed off and will f/u outpatient 3. BPH 4. H/o prostate cancer 5. Hypernatremia - likely 2/2 IVFs  P 1. If tolerating PO, stop IVF 2. Monitor strict ins/outs 3. Continue monitoring renal function to ensure resolution 4. Nephrology will sign off, please call if further questions or concerns arise  Alphonzo Grieve, MD PGY - 2 10/25/2016, 11:26 AM   Recent Labs Lab 10/23/16 1959 10/24/16 0307  10/25/16 0526  NA 142 144 146*  K 5.2* 4.1 3.6  CL 105 111 113*  CO2 23 21* 26  GLUCOSE 144* 121* 111*  BUN 94* 69* 28*  CREATININE 12.31* 8.26* 2.57*  CALCIUM 8.9 8.6* 8.1*    Recent Labs Lab 10/22/16 1344 10/23/16 0358 10/24/16 0307  WBC 7.4 8.0 6.7  NEUTROABS 6.1  --   --   HGB 13.2 11.8* 11.8*  HCT 39.8 36.2* 36.0*  MCV 88.1 88.5 88.5  PLT 268 254 262

## 2016-10-25 NOTE — Progress Notes (Signed)
Patient discharge teaching given, including activity, diet, follow-up appoints, nephrostomy flushing and drainage care and medications. Patient verbalized understanding of all discharge instructions. IV access was d/c'd. Vitals are stable. Skin is intact except as charted in most recent assessments. Pt to be escorted out by NT, to be driven home by family.  Jillyn Ledger, MBA, BSN, RN

## 2016-11-01 ENCOUNTER — Other Ambulatory Visit: Payer: Self-pay | Admitting: Urology

## 2016-11-01 NOTE — Progress Notes (Signed)
Cbc and bmp 10/25/16 epic ekg 10/22/16 epic

## 2016-11-01 NOTE — Progress Notes (Signed)
Orders are under signed and held in epic please release. Thank you

## 2016-11-01 NOTE — Patient Instructions (Signed)
GIANFRANCO ARAKI  11/01/2016   Your procedure is scheduled on: 11/11/16  Report to [facility] Main  Entrance Take [place]  elevators to 3rd floor to  [place] at    1100 AM.    Call this number if you have problems the morning of surgery [phone]    Remember: ONLY 1 PERSON MAY GO WITH YOU TO SHORT STAY TO GET  READY MORNING OF [place].  Do not eat food  :After Midnight. YOU MAY HAVE CLEAR LIQUIDS  UNTIL 0700 AM THEN NOTHING BY MOUTH     Take these medicines the morning of surgery with A SIP OF WATER:                                 You may not have any metal on your body including hair pins and              piercings  Do not wear jewelry, , lotions, powders , colognes  Or deodorant                         Men may shave face and neck.   Do not bring valuables to the hospital. [place].  Contacts, dentures or bridgework may not be worn into surgery.  Leave suitcase in the car. After surgery it may be brought to your room.                  Please read over the following fact sheets you were given: ____________________________________________________________________           [facility] - Preparing for Surgery Before surgery, you can play an important role.  Because skin is not sterile, your skin needs to be as free of germs as possible.  You can reduce the number of germs on your skin by washing with CHG (chlorahexidine gluconate) soap before surgery.  CHG is an antiseptic cleaner which kills germs and bonds with the skin to continue killing germs even after washing. Please DO NOT use if you have an allergy to CHG or antibacterial soaps.  If your skin becomes reddened/irritated stop using the CHG and inform your nurse when you arrive at Short Stay. Do not shave (including legs and underarms) for at least 48 hours prior to the first CHG shower.  You may shave your face/neck. Please follow these  instructions carefully:  1.  Shower with CHG Soap the night before surgery and the  morning of Surgery.  2.  If you choose to wash your hair, wash your hair first as usual with your  normal  shampoo.  3.  After you shampoo, rinse your hair and body thoroughly to remove the  shampoo.                           4.  Use CHG as you would any other liquid soap.  You can apply chg directly  to the skin and wash                       Gently with a scrungie or clean washcloth.  5.  Apply the CHG  Soap to your body ONLY FROM THE NECK DOWN.   Do not use on face/ open                           Wound or open sores. Avoid contact with eyes, ears mouth and genitals (private parts).                       Wash face,  Genitals (private parts) with your normal soap.             6.  Wash thoroughly, paying special attention to the area where your surgery  will be performed.  7.  Thoroughly rinse your body with warm water from the neck down.  8.  DO NOT shower/wash with your normal soap after using and rinsing off  the CHG Soap.                9.  Pat yourself dry with a clean towel.            10.  Wear clean pajamas.            11.  Place clean sheets on your bed the night of your first shower and do not  sleep with pets. Day of Surgery : Do not apply any lotions/deodorants the morning of surgery.  Please wear clean clothes to the hospital/surgery center.  FAILURE TO FOLLOW THESE INSTRUCTIONS MAY RESULT IN THE CANCELLATION OF YOUR SURGERY PATIENT SIGNATURE_________________________________  NURSE SIGNATURE__________________________________  ________________________________________________________________________    CLEAR LIQUID DIET  Until 0700  Am then nothing by mouth   Foods Allowed                                                                     Foods Excluded  Coffee and tea, regular and decaf                             liquids that you cannot  Plain Jell-O in any flavor                                              see through such as: Fruit ices (not with fruit pulp)                                     milk, soups, orange juice  Iced Popsicles                                    All solid food Carbonated beverages, regular and diet                                    Cranberry, grape and apple juices Sports drinks like Gatorade Lightly seasoned clear broth or consume(fat free) Sugar, honey syrup  Sample Menu Breakfast  Lunch                                     Supper Cranberry juice                    Beef broth                            Chicken broth Jell-O                                     Grape juice                           Apple juice Coffee or tea                        Jell-O                                      Popsicle                                                Coffee or tea                        Coffee or tea  _____________________________________________________________________

## 2016-11-04 ENCOUNTER — Encounter (HOSPITAL_COMMUNITY)
Admission: RE | Admit: 2016-11-04 | Discharge: 2016-11-04 | Disposition: A | Discharge status: Home / Self Care | Payer: Medicare Other | Source: Ambulatory Visit | Attending: Urology | Admitting: Urology

## 2016-11-04 ENCOUNTER — Encounter (HOSPITAL_COMMUNITY): Payer: Self-pay

## 2016-11-04 DIAGNOSIS — N3289 Other specified disorders of bladder: Secondary | ICD-10-CM | POA: Diagnosis not present

## 2016-11-04 DIAGNOSIS — Z01818 Encounter for other preprocedural examination: Secondary | ICD-10-CM | POA: Diagnosis present

## 2016-11-04 HISTORY — DX: Personal history of urinary calculi: Z87.442

## 2016-11-04 HISTORY — DX: Cardiac arrhythmia, unspecified: I49.9

## 2016-11-04 LAB — BASIC METABOLIC PANEL
ANION GAP: 6 (ref 5–15)
BUN: 32 mg/dL — ABNORMAL HIGH (ref 6–20)
CALCIUM: 9.4 mg/dL (ref 8.9–10.3)
CHLORIDE: 104 mmol/L (ref 101–111)
CO2: 30 mmol/L (ref 22–32)
CREATININE: 1.35 mg/dL — AB (ref 0.61–1.24)
GFR calc non Af Amer: 47 mL/min — ABNORMAL LOW (ref >60)
GFR, EST AFRICAN AMERICAN: 55 mL/min — AB (ref >60)
Glucose, Bld: 123 mg/dL — ABNORMAL HIGH (ref 65–99)
Potassium: 5.4 mmol/L — ABNORMAL HIGH (ref 3.5–5.1)
SODIUM: 140 mmol/L (ref 135–145)

## 2016-11-04 NOTE — Progress Notes (Signed)
Bmp done 11/04/16 routed to Dr. [name] via epic

## 2016-11-09 ENCOUNTER — Other Ambulatory Visit: Payer: Self-pay | Admitting: Cardiovascular Disease

## 2016-11-11 ENCOUNTER — Ambulatory Visit (HOSPITAL_COMMUNITY): Payer: Medicare Other | Admitting: Registered Nurse

## 2016-11-11 ENCOUNTER — Encounter (HOSPITAL_COMMUNITY)
Admission: RE | Disposition: A | Discharge status: Home / Self Care | Payer: Self-pay | Source: Ambulatory Visit | Attending: Urology

## 2016-11-11 ENCOUNTER — Observation Stay (HOSPITAL_COMMUNITY)
Admission: RE | Admit: 2016-11-11 | Discharge: 2016-11-12 | Disposition: A | Discharge status: Home / Self Care | Payer: Medicare Other | Source: Ambulatory Visit | Attending: Urology | Admitting: Urology

## 2016-11-11 ENCOUNTER — Encounter (HOSPITAL_COMMUNITY): Payer: Self-pay | Admitting: *Deleted

## 2016-11-11 ENCOUNTER — Ambulatory Visit (HOSPITAL_COMMUNITY): Payer: Medicare Other

## 2016-11-11 DIAGNOSIS — C679 Malignant neoplasm of bladder, unspecified: Principal | ICD-10-CM | POA: Insufficient documentation

## 2016-11-11 DIAGNOSIS — E785 Hyperlipidemia, unspecified: Secondary | ICD-10-CM | POA: Diagnosis not present

## 2016-11-11 DIAGNOSIS — I10 Essential (primary) hypertension: Secondary | ICD-10-CM | POA: Diagnosis not present

## 2016-11-11 DIAGNOSIS — Z85828 Personal history of other malignant neoplasm of skin: Secondary | ICD-10-CM | POA: Diagnosis not present

## 2016-11-11 DIAGNOSIS — D494 Neoplasm of unspecified behavior of bladder: Secondary | ICD-10-CM

## 2016-11-11 DIAGNOSIS — Z87891 Personal history of nicotine dependence: Secondary | ICD-10-CM | POA: Diagnosis not present

## 2016-11-11 DIAGNOSIS — Z880 Allergy status to penicillin: Secondary | ICD-10-CM | POA: Insufficient documentation

## 2016-11-11 DIAGNOSIS — Z8546 Personal history of malignant neoplasm of prostate: Secondary | ICD-10-CM | POA: Diagnosis not present

## 2016-11-11 DIAGNOSIS — N1330 Unspecified hydronephrosis: Secondary | ICD-10-CM | POA: Diagnosis present

## 2016-11-11 HISTORY — PX: CYSTOSCOPY W/ RETROGRADES: SHX1426

## 2016-11-11 SURGERY — CYSTOSCOPY, WITH RETROGRADE PYELOGRAM
Anesthesia: General | Site: Ureter | Laterality: Bilateral

## 2016-11-11 MED ORDER — OXYCODONE HCL 5 MG PO TABS: 5.0000 mg | ORAL_TABLET | ORAL | Status: DC | PRN

## 2016-11-11 MED ORDER — ONDANSETRON HCL 4 MG/2ML IJ SOLN
INTRAMUSCULAR | Status: AC
  Filled 2016-11-11: qty 2

## 2016-11-11 MED ORDER — SULFAMETHOXAZOLE-TRIMETHOPRIM 800-160 MG PO TABS: 1.0000 | ORAL_TABLET | Freq: Two times a day (BID) | ORAL | 0 refills | Status: DC

## 2016-11-11 MED ORDER — SODIUM CHLORIDE 0.45 % IV SOLN
INTRAVENOUS | Status: DC
  Administered 2016-11-11: 17:00:00 via INTRAVENOUS

## 2016-11-11 MED ORDER — CIPROFLOXACIN IN D5W 400 MG/200ML IV SOLN
400.0000 mg | INTRAVENOUS | Status: AC
  Administered 2016-11-11: 400 mg via INTRAVENOUS
  Filled 2016-11-11: qty 200

## 2016-11-11 MED ORDER — ACETAMINOPHEN 325 MG PO TABS: 650.0000 mg | ORAL_TABLET | ORAL | Status: DC | PRN

## 2016-11-11 MED ORDER — SULFAMETHOXAZOLE-TRIMETHOPRIM 800-160 MG PO TABS
1.0000 | ORAL_TABLET | Freq: Two times a day (BID) | ORAL | Status: DC
  Administered 2016-11-11 – 2016-11-12 (×2): 1 via ORAL
  Filled 2016-11-11 (×2): qty 1

## 2016-11-11 MED ORDER — PROPOFOL 10 MG/ML IV BOLUS
INTRAVENOUS | Status: AC
  Filled 2016-11-11: qty 20

## 2016-11-11 MED ORDER — METOPROLOL SUCCINATE ER 50 MG PO TB24
50.0000 mg | ORAL_TABLET | Freq: Once | ORAL | Status: AC
  Administered 2016-11-11: 50 mg via ORAL
  Filled 2016-11-11: qty 1

## 2016-11-11 MED ORDER — METOPROLOL SUCCINATE ER 50 MG PO TB24
50.0000 mg | ORAL_TABLET | Freq: Every day | ORAL | Status: DC
  Administered 2016-11-12: 50 mg via ORAL
  Filled 2016-11-11: qty 1

## 2016-11-11 MED ORDER — ZOLPIDEM TARTRATE 5 MG PO TABS: 5.0000 mg | ORAL_TABLET | Freq: Every evening | ORAL | Status: DC | PRN

## 2016-11-11 MED ORDER — FENTANYL CITRATE (PF) 100 MCG/2ML IJ SOLN
INTRAMUSCULAR | Status: DC | PRN
  Administered 2016-11-11: 50 ug via INTRAVENOUS

## 2016-11-11 MED ORDER — LIDOCAINE 2% (20 MG/ML) 5 ML SYRINGE
INTRAMUSCULAR | Status: DC | PRN
  Administered 2016-11-11: 80 mg via INTRAVENOUS

## 2016-11-11 MED ORDER — PROPOFOL 10 MG/ML IV BOLUS
INTRAVENOUS | Status: DC | PRN
  Administered 2016-11-11: 120 mg via INTRAVENOUS

## 2016-11-11 MED ORDER — BELLADONNA ALKALOIDS-OPIUM 16.2-60 MG RE SUPP
RECTAL | Status: DC | PRN
  Administered 2016-11-11: 1 via RECTAL

## 2016-11-11 MED ORDER — BELLADONNA-OPIUM 16.2-30 MG RE SUPP
RECTAL | Status: AC
  Filled 2016-11-11: qty 1

## 2016-11-11 MED ORDER — FENTANYL CITRATE (PF) 100 MCG/2ML IJ SOLN
INTRAMUSCULAR | Status: AC
  Filled 2016-11-11: qty 2

## 2016-11-11 MED ORDER — ONDANSETRON HCL 4 MG/2ML IJ SOLN
INTRAMUSCULAR | Status: DC | PRN
  Administered 2016-11-11: 4 mg via INTRAVENOUS

## 2016-11-11 MED ORDER — STERILE WATER FOR IRRIGATION IR SOLN
Status: DC | PRN
  Administered 2016-11-11: 3000 mL

## 2016-11-11 MED ORDER — EPHEDRINE 5 MG/ML INJ
INTRAVENOUS | Status: AC
  Filled 2016-11-11: qty 10

## 2016-11-11 MED ORDER — LIDOCAINE 2% (20 MG/ML) 5 ML SYRINGE
INTRAMUSCULAR | Status: AC
  Filled 2016-11-11: qty 5

## 2016-11-11 MED ORDER — ONDANSETRON HCL 4 MG/2ML IJ SOLN
4.0000 mg | INTRAMUSCULAR | Status: DC | PRN
  Administered 2016-11-11 – 2016-11-12 (×2): 4 mg via INTRAVENOUS
  Filled 2016-11-11 (×3): qty 2

## 2016-11-11 MED ORDER — LACTATED RINGERS IV SOLN
INTRAVENOUS | Status: DC
  Administered 2016-11-11: 12:00:00 via INTRAVENOUS
  Administered 2016-11-11: 1000 mL via INTRAVENOUS

## 2016-11-11 MED ORDER — EPHEDRINE SULFATE-NACL 50-0.9 MG/10ML-% IV SOSY
PREFILLED_SYRINGE | INTRAVENOUS | Status: DC | PRN
  Administered 2016-11-11 (×2): 5 mg via INTRAVENOUS

## 2016-11-11 MED ORDER — BELLADONNA-OPIUM 16.2-30 MG RE SUPP: 1.0000 | Freq: Four times a day (QID) | RECTAL | Status: DC | PRN

## 2016-11-11 MED ORDER — ENSURE ENLIVE PO LIQD
237.0000 mL | Freq: Two times a day (BID) | ORAL | Status: DC
  Administered 2016-11-11 – 2016-11-12 (×2): 237 mL via ORAL

## 2016-11-11 MED ORDER — FENTANYL CITRATE (PF) 100 MCG/2ML IJ SOLN
25.0000 ug | INTRAMUSCULAR | Status: DC | PRN
  Administered 2016-11-11 (×2): 50 ug via INTRAVENOUS

## 2016-11-11 MED ORDER — HYDROMORPHONE HCL-NACL 0.5-0.9 MG/ML-% IV SOSY
0.5000 mg | PREFILLED_SYRINGE | INTRAVENOUS | Status: DC | PRN
  Administered 2016-11-11 – 2016-11-12 (×2): 1 mg via INTRAVENOUS
  Filled 2016-11-11 (×2): qty 2

## 2016-11-11 SURGICAL SUPPLY — 35 items
BAG URINE DRAINAGE (UROLOGICAL SUPPLIES) IMPLANT
BAG URINE LEG 500ML (DRAIN) ×3 IMPLANT
BAG URO CATCHER STRL LF (MISCELLANEOUS) ×4 IMPLANT
BASKET LASER NITINOL 1.9FR (BASKET) ×4 IMPLANT
BASKET ZERO TIP NITINOL 2.4FR (BASKET) IMPLANT
BSKT STON RTRVL 120 1.9FR (BASKET) ×2
BSKT STON RTRVL ZERO TP 2.4FR (BASKET)
CATH FOLEY 2WAY SLVR  5CC 20FR (CATHETERS) ×2
CATH FOLEY 2WAY SLVR 30CC 24FR (CATHETERS) IMPLANT
CATH FOLEY 2WAY SLVR 5CC 20FR (CATHETERS) ×1 IMPLANT
CATH INTERMIT  6FR 70CM (CATHETERS) IMPLANT
CLOTH BEACON ORANGE TIMEOUT ST (SAFETY) ×4 IMPLANT
COVER SURGICAL LIGHT HANDLE (MISCELLANEOUS) ×4 IMPLANT
ELECT REM PT RETURN 15FT ADLT (MISCELLANEOUS) ×4 IMPLANT
EVACUATOR MICROVAS BLADDER (UROLOGICAL SUPPLIES) IMPLANT
FIBER LASER FLEXIVA 365 (UROLOGICAL SUPPLIES) IMPLANT
FIBER LASER TRAC TIP (UROLOGICAL SUPPLIES) IMPLANT
GLOVE BIOGEL M 8.0 STRL (GLOVE) ×4 IMPLANT
GOWN STRL REUS W/ TWL XL LVL3 (GOWN DISPOSABLE) ×2 IMPLANT
GOWN STRL REUS W/TWL LRG LVL3 (GOWN DISPOSABLE) ×8 IMPLANT
GOWN STRL REUS W/TWL XL LVL3 (GOWN DISPOSABLE) ×8 IMPLANT
GUIDEWIRE ANG ZIPWIRE 038X150 (WIRE) ×4 IMPLANT
GUIDEWIRE STR DUAL SENSOR (WIRE) ×4 IMPLANT
IV NS 1000ML (IV SOLUTION) ×4
IV NS 1000ML BAXH (IV SOLUTION) ×2 IMPLANT
LOOP MONOPOLAR YLW (ELECTROSURGICAL) IMPLANT
MANIFOLD NEPTUNE II (INSTRUMENTS) ×4 IMPLANT
NDL SAFETY ECLIPSE 18X1.5 (NEEDLE) ×2 IMPLANT
NEEDLE HYPO 18GX1.5 SHARP (NEEDLE) ×4
PACK CYSTO (CUSTOM PROCEDURE TRAY) ×4 IMPLANT
SET ASPIRATION TUBING (TUBING) IMPLANT
SYRINGE IRR TOOMEY STRL 70CC (SYRINGE) ×3 IMPLANT
TUBING CONNECTING 10 (TUBING) ×3 IMPLANT
TUBING CONNECTING 10' (TUBING) ×1
WATER STERILE IRR 3000ML UROMA (IV SOLUTION) ×4 IMPLANT

## 2016-11-11 NOTE — Anesthesia Preprocedure Evaluation (Addendum)
Anesthesia Evaluation  Patient identified by MRN, date of birth, ID band Patient awake    Reviewed: Allergy & Precautions, NPO status , Patient's Chart, lab work & pertinent test results  Airway Mallampati: II  TM Distance: >3 FB     Dental   Pulmonary former smoker,    breath sounds clear to auscultation       Cardiovascular hypertension, + dysrhythmias  Rhythm:Regular Rate:Normal     Neuro/Psych    GI/Hepatic negative GI ROS, Neg liver ROS,   Endo/Other    Renal/GU Renal disease     Musculoskeletal   Abdominal   Peds  Hematology   Anesthesia Other Findings   Reproductive/Obstetrics                            Anesthesia Physical Anesthesia Plan  ASA: III  Anesthesia Plan: General   Post-op Pain Management:    Induction: Intravenous  PONV Risk Score and Plan: 2 and Ondansetron, Dexamethasone, Propofol infusion and Treatment may vary due to age or medical condition  Airway Management Planned: LMA  Additional Equipment:   Intra-op Plan:   Post-operative Plan: Extubation in OR  Informed Consent: I have reviewed the patients History and Physical, chart, labs and discussed the procedure including the risks, benefits and alternatives for the proposed anesthesia with the patient or authorized representative who has indicated his/her understanding and acceptance.   Dental advisory given  Plan Discussed with: CRNA and Anesthesiologist  Anesthesia Plan Comments:       Anesthesia Quick Evaluation

## 2016-11-11 NOTE — Interval H&P Note (Signed)
History and Physical Interval Note:  11/11/2016 12:57 PM  Randall Little  has presented today for surgery, with the diagnosis of BLADDER LESION  The various methods of treatment have been discussed with the patient and family. After consideration of risks, benefits and other options for treatment, the patient has consented to  Procedure(s): CYSTOSCOPY WITH BIOPSY (N/A) CYSTOSCOPY WITH RETROGRADE PYELOGRAM (Bilateral) as a surgical intervention .  The patient's history has been reviewed, patient examined, no change in status, stable for surgery.  I have reviewed the patient's chart and labs.  Questions were answered to the patient's satisfaction.     Jorja Loa

## 2016-11-11 NOTE — Discharge Instructions (Signed)
You can resume your usual activities, starting on Friday.  We'll ask that you may have some blood dripping out your penis for a while  Call for significant fever or other worrisome issues.  Take sulfa pill for 3 days, twice a day.

## 2016-11-11 NOTE — Anesthesia Procedure Notes (Signed)
Procedure Name: LMA Insertion Date/Time: 11/11/2016 1:11 PM Performed by: Carleene Cooper A Pre-anesthesia Checklist: Patient identified, Emergency Drugs available, Suction available, Patient being monitored and Timeout performed Patient Re-evaluated:Patient Re-evaluated prior to induction Oxygen Delivery Method: Circle system utilized Preoxygenation: Pre-oxygenation with 100% oxygen Induction Type: IV induction Ventilation: Mask ventilation without difficulty LMA: LMA with gastric port inserted LMA Size: 4.0 Number of attempts: 1 Placement Confirmation: positive ETCO2 and breath sounds checked- equal and bilateral Tube secured with: Tape Dental Injury: Teeth and Oropharynx as per pre-operative assessment

## 2016-11-11 NOTE — Transfer of Care (Signed)
Immediate Anesthesia Transfer of Care Note  Patient: Randall Little  Procedure(s) Performed: Procedure(s): CYSTOSCOPY WITH TURBT (Bilateral)  Patient Location: PACU  Anesthesia Type:General  Level of Consciousness: awake, alert , oriented and patient cooperative  Airway & Oxygen Therapy: Patient Spontanous Breathing and Patient connected to face mask oxygen  Post-op Assessment: Report given to RN, Post -op Vital signs reviewed and stable and Patient moving all extremities  Post vital signs: Reviewed and stable  Last Vitals:  Vitals:   11/11/16 1110 11/11/16 1353  BP: 115/73 122/75  Pulse: (!) 103   Resp: 18   Temp: 36.8 C   SpO2: 100%     Last Pain:  Vitals:   11/11/16 1110  TempSrc: Oral      Patients Stated Pain Goal: 3 (37/10/62 6948)  Complications: No apparent anesthesia complications

## 2016-11-11 NOTE — H&P (Signed)
H&P  Chief Complaint: Bladder abnormality  History of Present Illness: 83 year old vigorous male with history of prostate cancer, on active surveillance.  He was recently found to have significant renal failure and bilateral hydronephrosis.  Urgent placement ofbilateral percutaneous nephrostomy tube was performed on 7/21. Cystoscopy in the office revealed significant abnormality of his trigone, with his ureteral orifices obscured.  There is concern for neoplastic involvemt. The patient presents at this time for cystoscopy and bladder biopsy.  Past Medical History:  Diagnosis Date  . Arrhythmia    Tachycardia  . Cancer ([place])    skin cancer removed from face  . Dyslipidemia   . Dysrhythmia   . History of kidney stones   . SOB (shortness of breath)     Past Surgical History:  Procedure Laterality Date  . gall stone removal    . IR NEPHROSTOMY PLACEMENT LEFT  10/23/2016  . IR NEPHROSTOMY PLACEMENT RIGHT  10/23/2016    Home Medications:  Allergies as of 11/11/2016      Reactions   Penicillin G Rash   Has patient had a PCN reaction causing immediate rash, facial/tongue/throat swelling, SOB or lightheadedness with hypotension: Yes Has patient had a PCN reaction causing severe rash involving mucus membranes or skin necrosis: No Has patient had a PCN reaction that required hospitalization: No Has patient had a PCN reaction occurring within the last 10 years: Yes If all of the above answers are "NO", then may proceed with Cephalosporin use.      Medication List    Notice   Cannot display discharge medications because the patient has not yet been admitted.     Allergies:  Allergies  Allergen Reactions  . Penicillin G Rash    Has patient had a PCN reaction causing immediate rash, facial/tongue/throat swelling, SOB or lightheadedness with hypotension: Yes Has patient had a PCN reaction causing severe rash involving mucus membranes or skin necrosis: No Has patient had a PCN reaction  that required hospitalization: No Has patient had a PCN reaction occurring within the last 10 years: Yes If all of the above answers are "NO", then may proceed with Cephalosporin use.    Family History  Problem Relation Age of Onset  . Heart disease Mother   . Cancer Sister   . Cancer Sister     Social History:  reports that he has quit smoking. He has never used smokeless tobacco. He reports that he does not drink alcohol or use drugs.  ROS: A complete review of systems was performed.  All systems are negative except for pertinent findings as noted.  Physical Exam:  Vital signs in last 24 hours:   Constitutional:  Alert and oriented, No acute distress.  He has lost weight recently. Cardiovascular: Regular rate and rhythm, No JVD Respiratory: Normal respiratory effort, Lungs clear bilaterally GI: Abdomen is soft, nontender, nondistended, no abdominal masses Genitourinary: No CVAT. Normal male phallus, testes are descended bilaterally and non-tender and without masses, scrotum is normal in appearance without lesions or masses, perineum is normal on inspection. Lymphatic: No lymphadenopathy Neurologic: Grossly intact, no focal deficits Psychiatric: Normal mood and affect  Laboratory Data:  No results for input(s): WBC, HGB, HCT, PLT in the last 72 hours.  No results for input(s): NA, K, CL, GLUCOSE, BUN, CALCIUM, CREATININE in the last 72 hours.  Invalid input(s): CO3   No results found for this or any previous visit (from the past 24 hour(s)). No results found for this or any previous visit (from the  past 240 hour(s)).  Renal Function:  Recent Labs  11/04/16 1118  CREATININE 1.35*   Estimated Creatinine Clearance: 40.9 mL/min (A) (by C-G formula based on SCr of 1.35 mg/dL (H)).  Radiologic Imaging: No results found.  Impression/Assessment:  1.  History of prostate cancer, on active surveillance. There has been no obvious progression of his cancer, which has been  low-grade, on repeat biopsies.  2. 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

## 2016-11-11 NOTE — Anesthesia Postprocedure Evaluation (Signed)
Anesthesia Post Note  Patient: Randall Little  Procedure(s) Performed: Procedure(s) (LRB): CYSTOSCOPY WITH TURBT (Bilateral)     Patient location during evaluation: PACU Anesthesia Type: General Level of consciousness: awake Pain management: pain level controlled Vital Signs Assessment: post-procedure vital signs reviewed and stable Respiratory status: spontaneous breathing Cardiovascular status: stable Postop Assessment: no signs of nausea or vomiting Anesthetic complications: no    Last Vitals:  Vitals:   11/11/16 1110 11/11/16 1353  BP: 115/73 122/75  Pulse: (!) 103 99  Resp: 18 15  Temp: 36.8 C (!) 36.4 C  SpO2: 100% 100%    Last Pain:  Vitals:   11/11/16 1400  TempSrc:   PainSc: (P) 7                  Lariah Fleer

## 2016-11-11 NOTE — Anesthesia Postprocedure Evaluation (Signed)
Anesthesia Post Note  Patient: Randall Little  Procedure(s) Performed: Procedure(s) (LRB): CYSTOSCOPY WITH TURBT (Bilateral)     Patient location during evaluation: PACU Anesthesia Type: General Level of consciousness: awake Pain management: pain level controlled Respiratory status: spontaneous breathing Cardiovascular status: stable Postop Assessment: no signs of nausea or vomiting Anesthetic complications: no    Last Vitals:  Vitals:   11/11/16 1415 11/11/16 1445  BP: 111/74   Pulse: 92   Resp: 12   Temp:    SpO2: 100% 100%    Last Pain:  Vitals:   11/11/16 1445  TempSrc:   PainSc: 5                  Sonna Lipsky

## 2016-11-11 NOTE — Op Note (Signed)
Preoperative diagnosis: Bilateral hydronephrosis, significant bladder urothelial abnormality on cystoscopy.  Postoperative diagnosis: Same  Principal procedure: Cystoscopy, attempted bilateral retrograde pyelograms, TURBT 2 centimeter bladder lesion  Surgeon: Vuong Musa  Anesthesia: Gen. With LMA  Complications: None  Specimen: Bladder tumor/biopsies, for permanent pathology  Drains: 20 French Foley catheter to leg bag.  Indications: 83 year old male whom I have been following with active surveillance for low-grade, low volume prostate cancer.  The patient recently developed acute renal failure and was found to have bilateral hydronephrosis. He had bilateral or cutaneous nephrostomy tubes placed.  Cystoscopy in the office revealed nodular/cystic changes throughout the bladder, more so in the trigonal region.  I was unable to see the ureteral orifices.  He presents at this time for cystoscopy, bladder biopsy to better elucidate the process 1 on within the patient's bladder and causing, more than likely, his hydronephrosis.  Findings: Urethra was normal, prostate nonobstructive.  The bladder revealed multiple cystic lesions throughout, both in the trigonal region, the lateral, anterior walls as well as the posterior/dome.  These cystic areas were quite large and seemed to be thin-walled.  I did not see any papillary lesions.  Some of these small cysts were quite small him a 1-2 millimeters in size.  I did not see ureteral orifices.  Description of procedure: The patient was properly identified in the holding area.  He received preoperative IV antibiotics.  Was taken the operating room where general anesthesia was administered.  He was placed in the dorsolithotomy position.  Genitalia and perineum were prepped and draped.  Proper timeout was performed.  A 26 French resectoscope sheath was passed using the visual obturator.  Urethra was normal, prostate nonobstructive.  The above-mentioned findings  were noted.  Following systematic evaluation/inspection of the bladder, the resectoscope element and the cutting loop was placed.  I then resected a fair number of these cystic structures, getting into the muscular layer with the resection.  Most of the resection was in the trigonal region, within the area of what would be the ureteral orifices.  Bladder neck tissue was incorporated with this.  I resected several of the cysts, down to the muscular layer as well.  Biopsies were rinsed from the bladder with the [facility] syringe.  It should be noted that the bladder was under high pressure, and would only hold approximately 50-70 milliliters of the saline.  Following collection of the biopsy specimens, the resectoscope was used to cauterize all biopsy sites.  Hemostasis was adequate.  A 20 French Foley catheter was then placed in the balloon filled with 10 mL of water.  Specimens percent for permanent pathology labeled "ladder tumor".  The patient was then taken to the PACU in stable condition, having tolerated the procedure well.

## 2016-11-11 NOTE — Progress Notes (Signed)
Foley catheter has had minimal output, which it was reported to Probation officer from PACU RN that the MD states that patient will probably have more output from the nephrostomy tubes than the foley catheter. However, the foley catheter was leaking around the tip of the penis quite a bit. Hand irrigated foley catheter per MD orders and removed several large clots. Will continue to monitor urine output and irrigate foley prn.

## 2016-11-12 ENCOUNTER — Encounter (HOSPITAL_COMMUNITY): Payer: Self-pay | Admitting: Urology

## 2016-11-12 DIAGNOSIS — C679 Malignant neoplasm of bladder, unspecified: Secondary | ICD-10-CM | POA: Diagnosis not present

## 2016-11-12 MED ORDER — OXYCODONE HCL 5 MG PO TABS: 5.0000 mg | ORAL_TABLET | ORAL | 0 refills | Status: DC | PRN

## 2016-11-12 NOTE — Progress Notes (Signed)
Patient and family member given discharge instructions.  Patient and family member verbalized understanding and demonstrated using leg straps for nephrostomy bags (per patient request) via teachback method.

## 2016-11-24 ENCOUNTER — Other Ambulatory Visit: Payer: Self-pay | Admitting: Urology

## 2016-11-24 DIAGNOSIS — N1330 Unspecified hydronephrosis: Secondary | ICD-10-CM

## 2016-11-26 ENCOUNTER — Encounter: Payer: Self-pay | Admitting: Oncology

## 2016-11-26 ENCOUNTER — Telehealth: Payer: Self-pay | Admitting: Oncology

## 2016-11-26 NOTE — Telephone Encounter (Signed)
Appt has been scheduled for the pt to see Dr. Alen Blew on 9/4 at 2pm. Pt aware to arrive 30 minutes early. Letter mailed to the pt and faxed to the referring.

## 2016-11-29 NOTE — Discharge Summary (Signed)
Physician Discharge Summary  Patient ID: Randall Little MRN: [id number] DOB/AGE: 09-May-1932 83 y.o.  Admit date: 11/11/2016 Discharge date: 11/12/2016  Admission Diagnoses: Bladder tumor Discharge Diagnoses:  Active Problems:   Bladder tumor   Discharged Condition: good  Hospital Course: The patient tolerated the procedure well and was transferred to the floor on IV pain meds, IV fluid. On POD#1  pt was started on regular diet and they ambulated in the halls. Prior to discharge the pt was tolerating a regular diet, pain was controlled on PO pain meds, they were ambulating without difficulty, and they had normal bowel function.   Consults: None  Significant Diagnostic Studies: none  Treatments: surgery: TURBT  Discharge Exam: Blood pressure 102/67, pulse 79, temperature 98.9 F (37.2 C), temperature source Oral, resp. rate 16, height 5\' 11"  (1.803 m), weight 68.5 kg (151 lb), SpO2 97 %. General appearance: alert, cooperative and appears stated age Nose: Nares normal. Septum midline. Mucosa normal. No drainage or sinus tenderness. Neck: no adenopathy, no carotid bruit, no JVD, supple, symmetrical, trachea midline and thyroid not enlarged, symmetric, no tenderness/mass/nodules Resp: clear to auscultation bilaterally Cardio: regular rate and rhythm, S1, S2 normal, no murmur, click, rub or gallop GI: soft, non-tender; bowel sounds normal; no masses,  no organomegaly Extremities: extremities normal, atraumatic, no cyanosis or edema Neurologic: Grossly normal  Disposition: 01-Home or Self Care  Discharge Instructions    Discharge patient    Complete by:  As directed    Discharge disposition:  01-Home or Self Care   Discharge patient date:  11/12/2016     Allergies as of 11/12/2016      Reactions   Penicillin G Rash   Has patient had a PCN reaction causing immediate rash, facial/tongue/throat swelling, SOB or lightheadedness with hypotension: Yes Has patient had a PCN reaction  causing severe rash involving mucus membranes or skin necrosis: No Has patient had a PCN reaction that required hospitalization: No Has patient had a PCN reaction occurring within the last 10 years: Yes If all of the above answers are "NO", then may proceed with Cephalosporin use.      Medication List    STOP taking these medications   aspirin EC 81 MG tablet     TAKE these medications   metoprolol succinate 50 MG 24 hr tablet Commonly known as:  TOPROL-XL TAKE 1 TABLET BY MOUTH ONCE DAILY .  TAKE  WITH  OR  IMMEDIATELY  FOLLOWING  A  MEAL   multivitamin with minerals Tabs tablet Take 1 tablet by mouth daily.   oxyCODONE 5 MG immediate release tablet Commonly known as:  Oxy IR/ROXICODONE Take 1 tablet (5 mg total) by mouth every 4 (four) hours as needed for moderate pain.   sulfamethoxazole-trimethoprim 800-160 MG tablet Commonly known as:  BACTRIM DS,SEPTRA DS Take 1 tablet by mouth 2 (two) times daily.            Discharge Care Instructions        Start     Ordered   11/12/16 0000  oxyCODONE (OXY IR/ROXICODONE) 5 MG immediate release tablet  Every 4 hours PRN     11/12/16 0826   11/12/16 0000  Discharge patient    Question Answer Comment  Discharge disposition 01-Home or Self Care   Discharge patient date 11/12/2016      11/12/16 0826   11/11/16 0000  sulfamethoxazole-trimethoprim (BACTRIM DS,SEPTRA DS) 800-160 MG tablet  2 times daily     11/11/16 1348  Follow-up Information    Franchot Gallo, MD.   Specialty:  Urology Why:  we'll call you Contact information: [address] [place] [id number] [phone]           Signed: Nicolette Bang 11/29/2016, 1:35 PM

## 2016-12-01 ENCOUNTER — Other Ambulatory Visit: Payer: Self-pay | Admitting: Urology

## 2016-12-01 ENCOUNTER — Ambulatory Visit (HOSPITAL_COMMUNITY)
Admission: RE | Admit: 2016-12-01 | Discharge: 2016-12-01 | Disposition: A | Discharge status: Home / Self Care | Payer: Medicare Other | Source: Ambulatory Visit | Attending: Urology | Admitting: Urology

## 2016-12-01 ENCOUNTER — Encounter (HOSPITAL_COMMUNITY): Payer: Self-pay | Admitting: Interventional Radiology

## 2016-12-01 ENCOUNTER — Other Ambulatory Visit (HOSPITAL_COMMUNITY): Payer: Self-pay | Admitting: Interventional Radiology

## 2016-12-01 DIAGNOSIS — Z8551 Personal history of malignant neoplasm of bladder: Secondary | ICD-10-CM | POA: Insufficient documentation

## 2016-12-01 DIAGNOSIS — Z436 Encounter for attention to other artificial openings of urinary tract: Secondary | ICD-10-CM | POA: Diagnosis present

## 2016-12-01 DIAGNOSIS — N1330 Unspecified hydronephrosis: Secondary | ICD-10-CM

## 2016-12-01 HISTORY — PX: IR NEPHROSTOMY EXCHANGE RIGHT: IMG6070

## 2016-12-01 HISTORY — PX: IR NEPHROSTOMY PLACEMENT LEFT: IMG6063

## 2016-12-01 MED ORDER — IOPAMIDOL (ISOVUE-300) INJECTION 61%
INTRAVENOUS | Status: AC
  Administered 2016-12-01: 20 mL via INTRAVENOUS
  Filled 2016-12-01: qty 50

## 2016-12-01 MED ORDER — LIDOCAINE HCL 1 % IJ SOLN
INTRAMUSCULAR | Status: DC | PRN
  Administered 2016-12-01: 10 mL

## 2016-12-01 MED ORDER — LIDOCAINE HCL (PF) 1 % IJ SOLN
INTRAMUSCULAR | Status: AC
  Filled 2016-12-01: qty 30

## 2016-12-01 MED ORDER — IOPAMIDOL (ISOVUE-300) INJECTION 61%
50.0000 mL | Freq: Once | INTRAVENOUS | Status: AC | PRN
  Administered 2016-12-01: 20 mL via INTRAVENOUS

## 2016-12-07 ENCOUNTER — Other Ambulatory Visit: Payer: Self-pay | Admitting: *Deleted

## 2016-12-07 ENCOUNTER — Ambulatory Visit (HOSPITAL_BASED_OUTPATIENT_CLINIC_OR_DEPARTMENT_OTHER): Payer: Medicare Other | Admitting: Oncology

## 2016-12-07 ENCOUNTER — Encounter: Payer: Self-pay | Admitting: Radiation Oncology

## 2016-12-07 ENCOUNTER — Telehealth: Payer: Self-pay | Admitting: Oncology

## 2016-12-07 ENCOUNTER — Ambulatory Visit (HOSPITAL_BASED_OUTPATIENT_CLINIC_OR_DEPARTMENT_OTHER): Payer: Medicare Other

## 2016-12-07 ENCOUNTER — Encounter: Payer: Self-pay | Admitting: *Deleted

## 2016-12-07 VITALS — BP 117/66 | HR 83 | Temp 98.1°F | Resp 20 | Ht 71.0 in | Wt 149.5 lb

## 2016-12-07 DIAGNOSIS — N289 Disorder of kidney and ureter, unspecified: Secondary | ICD-10-CM

## 2016-12-07 DIAGNOSIS — N1330 Unspecified hydronephrosis: Secondary | ICD-10-CM | POA: Diagnosis not present

## 2016-12-07 DIAGNOSIS — R0682 Tachypnea, not elsewhere classified: Secondary | ICD-10-CM | POA: Diagnosis not present

## 2016-12-07 DIAGNOSIS — C679 Malignant neoplasm of bladder, unspecified: Secondary | ICD-10-CM

## 2016-12-07 LAB — CBC WITH DIFFERENTIAL/PLATELET
BASO%: 0.3 % (ref 0.0–2.0)
Basophils Absolute: 0 10*3/uL (ref 0.0–0.1)
EOS%: 1.6 % (ref 0.0–7.0)
Eosinophils Absolute: 0.1 10*3/uL (ref 0.0–0.5)
HCT: 36.8 % — ABNORMAL LOW (ref 38.4–49.9)
HEMOGLOBIN: 11.8 g/dL — AB (ref 13.0–17.1)
LYMPH#: 1.3 10*3/uL (ref 0.9–3.3)
LYMPH%: 19.7 % (ref 14.0–49.0)
MCH: 29.4 pg (ref 27.2–33.4)
MCHC: 32.1 g/dL (ref 32.0–36.0)
MCV: 91.5 fL (ref 79.3–98.0)
MONO#: 0.8 10*3/uL (ref 0.1–0.9)
MONO%: 12.5 % (ref 0.0–14.0)
NEUT#: 4.4 10*3/uL (ref 1.5–6.5)
NEUT%: 65.9 % (ref 39.0–75.0)
Platelets: 251 10*3/uL (ref 140–400)
RBC: 4.02 10*6/uL — ABNORMAL LOW (ref 4.20–5.82)
RDW: 13.7 % (ref 11.0–14.6)
WBC: 6.7 10*3/uL (ref 4.0–10.3)

## 2016-12-07 LAB — COMPREHENSIVE METABOLIC PANEL
ALBUMIN: 2.9 g/dL — AB (ref 3.5–5.0)
ALK PHOS: 99 U/L (ref 40–150)
ALT: 25 U/L (ref 0–55)
AST: 21 U/L (ref 5–34)
Anion Gap: 6 mEq/L (ref 3–11)
BUN: 20.5 mg/dL (ref 7.0–26.0)
CALCIUM: 9.4 mg/dL (ref 8.4–10.4)
CHLORIDE: 103 meq/L (ref 98–109)
CO2: 29 mEq/L (ref 22–29)
Creatinine: 1.5 mg/dL — ABNORMAL HIGH (ref 0.7–1.3)
EGFR: 44 mL/min/{1.73_m2} — AB (ref >90)
Glucose: 122 mg/dl (ref 70–140)
POTASSIUM: 4.6 meq/L (ref 3.5–5.1)
SODIUM: 138 meq/L (ref 136–145)
Total Bilirubin: 0.45 mg/dL (ref 0.20–1.20)
Total Protein: 6.9 g/dL (ref 6.4–8.3)

## 2016-12-07 NOTE — Progress Notes (Signed)
[facility] Location of Tumor / Histology: High grade urothelial carcinoma  Randall Little presented with symptoms of frequent urination, incontinence and dysuria. Patient was found to have acute renal failure and bilateral hydronephrosis . Subsequently he had urgent placement of percutaneous nephrostomy tubes on 10/23/2016.    Past/Anticipated interventions by urology, if any: placement of percutaneous nephrostomy tubes, cystoscopy and TURBT.  Past/Anticipated interventions by medical oncology, if any: Ordered PET scan which is scheduled for 12/21/16. Referred to radiation oncology. Not a surgical candidate. Not a cisplatinum candidate due to renal failure. If PET shows metastatic disease Dr. Alen Blew plans to discuss palliative chemotherapy alone with the patient.   Weight changes, if any: -20 lb since October 22, 2016  Bowel/Bladder complaints, if any: Reports hematuria for two weeks s/p nephrostomy tube placement which has resolved.   Nausea/Vomiting, if any: no  Pain issues, if any:  Low back pain at the site of his kidneys. Describes the pain as burning. Reports the frequency of pain has increased. Reports taking oxy IR 5 mg at bedtime only.   SAFETY ISSUES:  Prior radiation? no  Pacemaker/ICD? no  Possible current pregnancy? no  Is the patient on methotrexate? no  Current Complaints / other details:  83 year old male. Hx of skin cancer on face. Denies bowel complaints. Reports dyspnea without exertion (metabolic acidosis ?). Reports declining performance status. Reports a reasonable appetite. Married. Former smoker. Sisters x 2 both deceased from cancer.   Worked as a Midwife up until six weeks ago.

## 2016-12-07 NOTE — Progress Notes (Signed)
Reason for Referral: Bladder cancer.   HPI: 83 year old gentleman referred to me for evaluation of new diagnosis of bladder cancer. He is a gentleman with a long-standing history of low-grade prostate cancer under surveillance with Dr. [name]. He had a Gleason score 6 and biopsies in the past did not show transformation of his tumor. He started developing symptoms of frequent urination and dysuria and was found to have acute renal failure and bilateral hydronephrosis on his CT scan done on 10/22/2016. His creatinine at that time was around 14 with a BUN of 94. His potassium was 6.3. He subsequently had a urgent placement of percutaneous nephrostomy tube on 10/23/2016. Cystoscopy showed a tumor in his trigone and ureteral orifices were obscured. He underwent cystoscopy and TURBT by Dr. [name] on 11/11/2016. The final pathology revealed invasive high-grade urothelial carcinoma with plasmacytoid variant involving the muscle wall. Dr. [name] felt that he is not on operative candidate and was referred to me for further evaluation. Clinically, he does not report any other discomfort or hematuria. He continues to have the bilateral nephrostomy tubes in place. He does report dyspnea especially with exertion and have difficulties completing sentences without being short of breath. He denied any abdominal pain or chest pain. His appetite has been reasonable but his performance status has been declining. He had a reasonable performance status up to 6 weeks ago where he started to decline.  He does not report any headaches, blurry vision, syncope or seizures. He does not report any fevers, chills or sweats. He does not report any cough, wheezing or hemoptysis. He does not report any chest pain, palpitation, orthopnea. He does not report any nausea, vomiting or abdominal pain. He does not report any constipation or diarrhea. He does not report any hematuria or dysuria. Remaining review of systems unremarkable.    Past Medical History:  Diagnosis Date  . Arrhythmia    Tachycardia  . Cancer ([place])    skin cancer removed from face  . Dyslipidemia   . Dysrhythmia   . History of kidney stones   . SOB (shortness of breath)   :  Past Surgical History:  Procedure Laterality Date  . CYSTOSCOPY W/ RETROGRADES Bilateral 11/11/2016   Procedure: CYSTOSCOPY WITH TURBT;  Surgeon: Franchot Gallo, MD;  Location: [facility];  Service: Urology;  Laterality: Bilateral;  . gall stone removal    . IR NEPHROSTOMY EXCHANGE RIGHT  12/01/2016  . IR NEPHROSTOMY PLACEMENT LEFT  10/23/2016  . IR NEPHROSTOMY PLACEMENT LEFT  12/01/2016  . IR NEPHROSTOMY PLACEMENT RIGHT  10/23/2016  :   Current Outpatient Prescriptions:  .  metoprolol succinate (TOPROL-XL) 50 MG 24 hr tablet, TAKE 1 TABLET BY MOUTH ONCE DAILY .  TAKE  WITH  OR  IMMEDIATELY  FOLLOWING  A  MEAL, Disp: 90 tablet, Rfl: 2 .  Multiple Vitamin (MULTIVITAMIN WITH MINERALS) TABS tablet, Take 1 tablet by mouth daily., Disp: , Rfl:  .  oxyCODONE (OXY IR/ROXICODONE) 5 MG immediate release tablet, Take 1 tablet (5 mg total) by mouth every 4 (four) hours as needed for moderate pain., Disp: 30 tablet, Rfl: 0 .  sulfamethoxazole-trimethoprim (BACTRIM DS,SEPTRA DS) 800-160 MG tablet, Take 1 tablet by mouth 2 (two) times daily. (Patient not taking: Reported on 12/07/2016), Disp: 6 tablet, Rfl: 0 .  zolpidem (AMBIEN) 5 MG tablet, , Disp: , Rfl: 0:  Allergies  Allergen Reactions  . Penicillin G Rash    Has patient had a PCN reaction causing immediate rash, facial/tongue/throat swelling, SOB or  lightheadedness with hypotension: Yes Has patient had a PCN reaction causing severe rash involving mucus membranes or skin necrosis: No Has patient had a PCN reaction that required hospitalization: No Has patient had a PCN reaction occurring within the last 10 years: Yes If all of the above answers are "NO", then may proceed with Cephalosporin use.  :  Family History  Problem  Relation Age of Onset  . Heart disease Mother   . Cancer Sister   . Cancer Sister   :  Social History   Social History  . Marital status: Married    Spouse name: N/A  . Number of children: N/A  . Years of education: N/A   Occupational History  . Not on file.   Social History Main Topics  . Smoking status: Former Research scientist (life sciences)  . Smokeless tobacco: Never Used     Comment: 50 years ago  . Alcohol use No  . Drug use: No  . Sexual activity: Yes   Other Topics Concern  . Not on file   Social History Narrative  . No narrative on file  :  Pertinent items are noted in HPI.  Exam: Blood pressure 117/66, pulse 83, temperature 98.1 F (36.7 C), temperature source Oral, resp. rate 20, height 5\' 11"  (1.803 m), weight 149 lb 8 oz (67.8 kg), SpO2 99 %. General appearance: alert and cooperative Throat: lips, mucosa, and tongue normal; teeth and gums normal Neck: no adenopathy Back: negative Resp: clear to auscultation bilaterally Cardio: regular rate and rhythm, S1, S2 normal, no murmur, click, rub or gallop GI: soft, non-tender; bowel sounds normal; no masses,  no organomegaly Extremities: extremities normal, atraumatic, no cyanosis or edema Pulses: 2+ and symmetric Skin: Skin color, texture, turgor normal. No rashes or lesions Lymph nodes: Cervical, supraclavicular, and axillary nodes normal.  CBC    Component Value Date/Time   WBC 6.7 10/24/2016 0307   RBC 4.07 (L) 10/24/2016 0307   HGB 11.8 (L) 10/24/2016 0307   HCT 36.0 (L) 10/24/2016 0307   PLT 262 10/24/2016 0307   MCV 88.5 10/24/2016 0307   MCH 29.0 10/24/2016 0307   MCHC 32.8 10/24/2016 0307   RDW 13.4 10/24/2016 0307   LYMPHSABS 1.1 10/22/2016 1344   MONOABS 0.2 10/22/2016 1344   EOSABS 0.0 10/22/2016 1344   BASOSABS 0.0 10/22/2016 1344     Chemistry      Component Value Date/Time   NA 140 11/04/2016 1118   NA 142 08/09/2016 0956   K 5.4 (H) 11/04/2016 1118   CL 104 11/04/2016 1118   CO2 30 11/04/2016 1118    BUN 32 (H) 11/04/2016 1118   BUN 19 08/09/2016 0956   CREATININE 1.35 (H) 11/04/2016 1118   CREATININE 1.42 (H) 06/05/2015 0811      Component Value Date/Time   CALCIUM 9.4 11/04/2016 1118   ALKPHOS 89 10/22/2016 1344   AST 17 10/22/2016 1344   ALT 17 10/22/2016 1344   BILITOT 0.8 10/22/2016 1344   BILITOT 0.5 08/09/2016 0956        Ir Nephrostomy Placement Left  Result Date: 12/01/2016 INDICATION: Bladder carcinoma and indwelling bilateral percutaneous nephrostomy tubes originally placed on 10/23/2016. Exchange of the nephrostomy tubes now performed. EXAM: BILATERAL PERCUTANEOUS NEPHROSTOMY TUBE EXCHANGE COMPARISON:  10/23/2016 MEDICATIONS: None ANESTHESIA/SEDATION: None CONTRAST:  20 mL Isovue-300 - administered into the collecting system(s) FLUOROSCOPY TIME:  Fluoroscopy Time: 1 minute and 18 seconds. 14 mGy. COMPLICATIONS: None immediate. PROCEDURE: Informed written consent was obtained from the patient after a  thorough discussion of the procedural risks, benefits and alternatives. All questions were addressed. Maximal Sterile Barrier Technique was utilized including caps, mask, sterile gowns, sterile gloves, sterile drape, hand hygiene and skin antiseptic. A timeout was performed prior to the initiation of the procedure. Both nephrostomy tubes were injected with contrast material. Retention sutures were cut. The tubes were removed over guidewires. New 10 French percutaneous nephrostomy tubes were then advanced over wires and formed. Contrast was injected and fluoroscopic spot images obtained to confirm catheter position. Both tubes were then connected to new gravity drainage bags. FINDINGS: Nephrostomy tubes were formed at the level of the renal pelvis bilaterally. Percutaneous tracts appear well formed and retention sutures were not placed after exchange of the tubes. IMPRESSION: Exchange of bilateral indwelling 10 French percutaneous nephrostomy tubes. An appointment will be made for  routine tube change in 8 weeks. Electronically Signed   By: Aletta Edouard M.D.   On: 12/01/2016 12:57   Ir Nephrostomy Exchange Right  Result Date: 12/01/2016 INDICATION: Bladder carcinoma and indwelling bilateral percutaneous nephrostomy tubes originally placed on 10/23/2016. Exchange of the nephrostomy tubes now performed. EXAM: BILATERAL PERCUTANEOUS NEPHROSTOMY TUBE EXCHANGE COMPARISON:  10/23/2016 MEDICATIONS: None ANESTHESIA/SEDATION: None CONTRAST:  20 mL Isovue-300 - administered into the collecting system(s) FLUOROSCOPY TIME:  Fluoroscopy Time: 1 minute and 18 seconds. 14 mGy. COMPLICATIONS: None immediate. PROCEDURE: Informed written consent was obtained from the patient after a thorough discussion of the procedural risks, benefits and alternatives. All questions were addressed. Maximal Sterile Barrier Technique was utilized including caps, mask, sterile gowns, sterile gloves, sterile drape, hand hygiene and skin antiseptic. A timeout was performed prior to the initiation of the procedure. Both nephrostomy tubes were injected with contrast material. Retention sutures were cut. The tubes were removed over guidewires. New 10 French percutaneous nephrostomy tubes were then advanced over wires and formed. Contrast was injected and fluoroscopic spot images obtained to confirm catheter position. Both tubes were then connected to new gravity drainage bags. FINDINGS: Nephrostomy tubes were formed at the level of the renal pelvis bilaterally. Percutaneous tracts appear well formed and retention sutures were not placed after exchange of the tubes. IMPRESSION: Exchange of bilateral indwelling 10 French percutaneous nephrostomy tubes. An appointment will be made for routine tube change in 8 weeks. Electronically Signed   By: Aletta Edouard M.D.   On: 12/01/2016 12:57    Assessment and Plan:   83 year old gentleman with the following issues:  1. High-grade urothelial carcinoma, plasmacytoid variant  diagnosed in July 2018. He presented with bilateral hydronephrosis and acute renal failure. He is status post TURBT on 11/11/2016 confirmed the presence of muscle invasive disease. He did have a noncontrast CT scan on 10/22/2016 and did not show clear-cut metastatic disease.  The natural course of this disease was discussed today with the patient. This appears to be rather aggressive variant of a high-grade urothelial carcinoma. He appears to be a poor surgical candidate and surgery might not be curative at this time.   The first up would be to complete the staging to determine whether he has metastatic disease. If he continues to have localized disease, and if surgery continues to be no option for him we have discussed the role of chemotherapy with radiation as an alternative. Complications associated with platinum based chemotherapy with radiation as an alternative to primary surgical therapy. He will not be a cisplatinum candidate given his renal failure. Complications include fatigue, tiredness, myelosuppression, renal insufficiency among others.  I will refer him to  radiation oncology for evaluation after he completes the PET scan for staging purposes. If his PET scan shows metastatic disease, we will discuss palliative chemotherapy alone as an option.  He understands his prognosis is guarded regardless of the results of the PET scan due to the aggressive nature of this malignancy.  2. Acute renal failure: Related to hydronephrosis with bilateral nephrostomy tube in place. These will continue indefinitely for the time being.  3. Tachypnea: Unclear etiology without hypoxia. Could be related to metabolic acidosis because of his renal failure. We will repeat his labs today he will have imaging studies in the near future.  4. Follow-up in the next few weeks for follow his progress.

## 2016-12-07 NOTE — Telephone Encounter (Signed)
Scheduled appt 9/4 los - Gave patient AVS and calender per los. - [facility] to contact patient with PET schedule.

## 2016-12-08 ENCOUNTER — Ambulatory Visit
Admission: RE | Admit: 2016-12-08 | Discharge: 2016-12-08 | Disposition: A | Discharge status: Home / Self Care | Payer: Medicare Other | Source: Ambulatory Visit | Attending: Radiation Oncology | Admitting: Radiation Oncology

## 2016-12-08 ENCOUNTER — Encounter: Payer: Self-pay | Admitting: Radiation Oncology

## 2016-12-08 DIAGNOSIS — Z85828 Personal history of other malignant neoplasm of skin: Secondary | ICD-10-CM | POA: Insufficient documentation

## 2016-12-08 DIAGNOSIS — C670 Malignant neoplasm of trigone of bladder: Secondary | ICD-10-CM | POA: Diagnosis not present

## 2016-12-08 DIAGNOSIS — C679 Malignant neoplasm of bladder, unspecified: Secondary | ICD-10-CM

## 2016-12-08 DIAGNOSIS — Z79899 Other long term (current) drug therapy: Secondary | ICD-10-CM | POA: Diagnosis not present

## 2016-12-08 DIAGNOSIS — Z880 Allergy status to penicillin: Secondary | ICD-10-CM | POA: Diagnosis not present

## 2016-12-08 DIAGNOSIS — Z87442 Personal history of urinary calculi: Secondary | ICD-10-CM | POA: Diagnosis not present

## 2016-12-08 DIAGNOSIS — Z510 Encounter for antineoplastic radiation therapy: Secondary | ICD-10-CM | POA: Diagnosis present

## 2016-12-08 DIAGNOSIS — Z936 Other artificial openings of urinary tract status: Secondary | ICD-10-CM | POA: Insufficient documentation

## 2016-12-08 DIAGNOSIS — E785 Hyperlipidemia, unspecified: Secondary | ICD-10-CM | POA: Diagnosis not present

## 2016-12-08 DIAGNOSIS — Z8249 Family history of ischemic heart disease and other diseases of the circulatory system: Secondary | ICD-10-CM | POA: Insufficient documentation

## 2016-12-08 DIAGNOSIS — C61 Malignant neoplasm of prostate: Secondary | ICD-10-CM | POA: Diagnosis not present

## 2016-12-08 DIAGNOSIS — Z87891 Personal history of nicotine dependence: Secondary | ICD-10-CM | POA: Insufficient documentation

## 2016-12-08 DIAGNOSIS — D494 Neoplasm of unspecified behavior of bladder: Secondary | ICD-10-CM

## 2016-12-08 HISTORY — DX: Malignant neoplasm of bladder, unspecified: C67.9

## 2016-12-08 HISTORY — DX: Unspecified malignant neoplasm of skin, unspecified: C44.90

## 2016-12-08 NOTE — Progress Notes (Signed)
See progress note under physician encounter. 

## 2016-12-08 NOTE — Progress Notes (Signed)
Radiation Oncology         [phone] ________________________________  Initial Outpatient Consultation  Name: Randall Little MRN: [id number]  Date of Service: 12/08/2016 DOB: 03/04/1933  YI:RSWNIO, Curt Jews, MD  Wyatt Portela, MD   REFERRING PHYSICIAN: Wyatt Portela, MD  DIAGNOSIS: 83 y.o. 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 Bladder tumor D49.4     HISTORY OF PRESENT ILLNESS: Randall Little is an 83 y.o. male seen at the request of Dr. Alen Blew for evaluation of new diagnosis of bladder cancer. He has a long-standing history of low-grade prostate cancer under active surveillance with Dr. [name]. He had a Gleason score of 3+3=6, and previous biopsies have not shown transformation of his tumor. He presented to the ER on 10/22/16 with complaints of right flank pain, persistent frequent urination and dysuria and was found to have acute renal failure and bilateral hydroureteronephrosis on his CT A/P. His creatinine at the time of admission was around 14 with a BUN of 94. His potassium was 6.3. He subsequently underwent urgent placement of bilateral percutaneous nephrostomy tubes on 10/23/2016 (tubes still in place) with dramatic improvement in his renal function- Cr was 2.5 at time of discharge on 10/25/16.    He followed up with Dr. [name] for office cystoscopy which showed a tumor in his trigone and bilateral ureteral orifices were obscured. He was taken to the OR for further evaluation with cystoscopy and TURBT on 11/11/2016. 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 Dr. [name] felt that he is not an operative candidate and was therefore referred to Dr. Alen Blew for further evaluation and discussion of treatment options.  Dr. Alen Blew has recommended concurrent chemoradiation pending his disease is not far advanced.  If he is  found to have advanced disease, the recommendation would then be to proceed with palliative chemotherapy alone.  Before beginning treatment, Dr. Alen Blew has ordered a PET scan for further disease staging, currently scheduled for 12/21/2016.  The patient presents today to discuss potential radiotherapy options. He is accompanied by his wife.   PREVIOUS RADIATION THERAPY: No  PAST MEDICAL HISTORY:  Past Medical History:  Diagnosis Date  . Arrhythmia    Tachycardia  . Bladder cancer ([place])   . Dyslipidemia   . Dysrhythmia   . History of kidney stones   . Skin cancer    removed from face  . SOB (shortness of breath)       PAST SURGICAL HISTORY: Past Surgical History:  Procedure Laterality Date  . CYSTOSCOPY W/ RETROGRADES Bilateral 11/11/2016   Procedure: CYSTOSCOPY WITH TURBT;  Surgeon: Franchot Gallo, MD;  Location: [facility];  Service: Urology;  Laterality: Bilateral;  . gall stone removal    . IR NEPHROSTOMY EXCHANGE RIGHT  12/01/2016  . IR NEPHROSTOMY PLACEMENT LEFT  10/23/2016  . IR NEPHROSTOMY PLACEMENT LEFT  12/01/2016  . IR NEPHROSTOMY PLACEMENT RIGHT  10/23/2016    FAMILY HISTORY:  Family History  Problem Relation Age of Onset  . Heart disease Mother   . Cancer Sister   . Cancer Sister     SOCIAL HISTORY:  Social History   Social History  . Marital status: Married    Spouse name: N/A  . Number of children: N/A  . Years of education: N/A   Occupational History  . Not on file.   Social History Main Topics  .  Smoking status: Former Smoker    Packs/day: 0.25    Years: 15.00    Types: Cigarettes    Quit date: 04/05/1966  . Smokeless tobacco: Never Used     Comment: 50 years ago  . Alcohol use No  . Drug use: No  . Sexual activity: Yes   Other Topics Concern  . Not on file   Social History Narrative  . No narrative on file    ALLERGIES: Penicillin g  MEDICATIONS:  Current Outpatient Prescriptions  Medication Sig Dispense Refill  . metoprolol  succinate (TOPROL-XL) 50 MG 24 hr tablet TAKE 1 TABLET BY MOUTH ONCE DAILY .  TAKE  WITH  OR  IMMEDIATELY  FOLLOWING  A  MEAL 90 tablet 2  . Multiple Vitamin (MULTIVITAMIN WITH MINERALS) TABS tablet Take 1 tablet by mouth daily.    [name] oxyCODONE (OXY IR/ROXICODONE) 5 MG immediate release tablet Take 1 tablet (5 mg total) by mouth every 4 (four) hours as needed for moderate pain. 30 tablet 0  . zolpidem (AMBIEN) 5 MG tablet   0   No current facility-administered medications for this encounter.     REVIEW OF SYSTEMS:  On review of systems, the patient reports that he is doing well overall. He denies any chest pain, cough, fevers, chills, or night sweats. He reports a reasonable appetite despite a 20 pound unintended weight loss in the past 2 months. He reports shortness of breath, dyspnea with and without exertion, and declining performance status. He denies any bowel disturbances, abdominal pain, nausea or vomiting. He reports having hematuria for two weeks following nephrostomy tube placement, which has now resolved. He has low back pain at the site of his PCNs, which he describes as burning. He reports the frequency of the pain has increased. He takes Oxycodone at night to help him rest. A complete review of systems is obtained and is otherwise negative.    PHYSICAL EXAM:  Wt Readings from Last 3 Encounters:  12/08/16 148 lb 3.2 oz (67.2 kg)  12/07/16 149 lb 8 oz (67.8 kg)  11/11/16 151 lb (68.5 kg)   Temp Readings from Last 3 Encounters:  12/08/16 97.7 F (36.5 C) (Oral)  12/07/16 98.1 F (36.7 C) (Oral)  11/12/16 98.9 F (37.2 C) (Oral)   BP Readings from Last 3 Encounters:  12/08/16 124/78  12/07/16 117/66  11/12/16 102/67   Pulse Readings from Last 3 Encounters:  12/08/16 84  12/07/16 83  11/12/16 79   Pain Assessment Pain Score: 0-No pain/10  In general this is a well appearing Caucasian male in no acute distress. He is alert and oriented x4 and appropriate throughout the  examination. Skin is intact without any evidence of gross lesions. Cardiovascular exam reveals a regular rate and rhythm, no clicks rubs or murmurs are auscultated. Chest is clear to auscultation bilaterally. Lymphatic assessment is performed and does not reveal any adenopathy in the cervical, supraclavicular, axillary, or inguinal chains. Abdomen has active bowel sounds in all quadrants and is intact. The abdomen is soft, non tender, non distended. Lower extremities are negative for pretibial pitting edema, deep calf tenderness, cyanosis or clubbing.   KPS = 70  100 - Normal; no complaints; no evidence of disease. 90   - Able to carry on normal activity; minor signs or symptoms of disease. 80   - Normal activity with effort; some signs or symptoms of disease. 59   - Cares for self; unable to carry on normal activity or to do  active work. 60   - Requires occasional assistance, but is able to care for most of his personal needs. 50   - Requires considerable assistance and frequent medical care. 68   - Disabled; requires special care and assistance. 67   - Severely disabled; hospital admission is indicated although death not imminent. 63   - Very sick; hospital admission necessary; active supportive treatment necessary. 10   - Moribund; fatal processes progressing rapidly. 0     - Dead  Karnofsky DA, Abelmann [place], Craver LS and Burchenal JH [phone]) The use of the nitrogen mustards in the palliative treatment of carcinoma: with particular reference to bronchogenic carcinoma Cancer 1 634-56  LABORATORY DATA:  Lab Results  Component Value Date   WBC 6.7 12/07/2016   HGB 11.8 (L) 12/07/2016   HCT 36.8 (L) 12/07/2016   MCV 91.5 12/07/2016   PLT 251 12/07/2016   Lab Results  Component Value Date   NA 138 12/07/2016   K 4.6 12/07/2016   CL 104 11/04/2016   CO2 29 12/07/2016   Lab Results  Component Value Date   ALT 25 12/07/2016   AST 21 12/07/2016   ALKPHOS 99 12/07/2016   BILITOT 0.45  12/07/2016     RADIOGRAPHY: Ir Nephrostomy Placement Left  Result Date: 12/01/2016 INDICATION: Bladder carcinoma and indwelling bilateral percutaneous nephrostomy tubes originally placed on 10/23/2016. Exchange of the nephrostomy tubes now performed. EXAM: BILATERAL PERCUTANEOUS NEPHROSTOMY TUBE EXCHANGE COMPARISON:  10/23/2016 MEDICATIONS: None ANESTHESIA/SEDATION: None CONTRAST:  20 mL Isovue-300 - administered into the collecting system(s) FLUOROSCOPY TIME:  Fluoroscopy Time: 1 minute and 18 seconds. 14 mGy. COMPLICATIONS: None immediate. PROCEDURE: Informed written consent was obtained from the patient after a thorough discussion of the procedural risks, benefits and alternatives. All questions were addressed. Maximal Sterile Barrier Technique was utilized including caps, mask, sterile gowns, sterile gloves, sterile drape, hand hygiene and skin antiseptic. A timeout was performed prior to the initiation of the procedure. Both nephrostomy tubes were injected with contrast material. Retention sutures were cut. The tubes were removed over guidewires. New 10 French percutaneous nephrostomy tubes were then advanced over wires and formed. Contrast was injected and fluoroscopic spot images obtained to confirm catheter position. Both tubes were then connected to new gravity drainage bags. FINDINGS: Nephrostomy tubes were formed at the level of the renal pelvis bilaterally. Percutaneous tracts appear well formed and retention sutures were not placed after exchange of the tubes. IMPRESSION: Exchange of bilateral indwelling 10 French percutaneous nephrostomy tubes. An appointment will be made for routine tube change in 8 weeks. Electronically Signed   By: Aletta Edouard M.D.   On: 12/01/2016 12:57   Ir Nephrostomy Exchange Right  Result Date: 12/01/2016 INDICATION: Bladder carcinoma and indwelling bilateral percutaneous nephrostomy tubes originally placed on 10/23/2016. Exchange of the nephrostomy tubes now  performed. EXAM: BILATERAL PERCUTANEOUS NEPHROSTOMY TUBE EXCHANGE COMPARISON:  10/23/2016 MEDICATIONS: None ANESTHESIA/SEDATION: None CONTRAST:  20 mL Isovue-300 - administered into the collecting system(s) FLUOROSCOPY TIME:  Fluoroscopy Time: 1 minute and 18 seconds. 14 mGy. COMPLICATIONS: None immediate. PROCEDURE: Informed written consent was obtained from the patient after a thorough discussion of the procedural risks, benefits and alternatives. All questions were addressed. Maximal Sterile Barrier Technique was utilized including caps, mask, sterile gowns, sterile gloves, sterile drape, hand hygiene and skin antiseptic. A timeout was performed prior to the initiation of the procedure. Both nephrostomy tubes were injected with contrast material. Retention sutures were cut. The tubes were removed over guidewires. New 10  French percutaneous nephrostomy tubes were then advanced over wires and formed. Contrast was injected and fluoroscopic spot images obtained to confirm catheter position. Both tubes were then connected to new gravity drainage bags. FINDINGS: Nephrostomy tubes were formed at the level of the renal pelvis bilaterally. Percutaneous tracts appear well formed and retention sutures were not placed after exchange of the tubes. IMPRESSION: Exchange of bilateral indwelling 10 French percutaneous nephrostomy tubes. An appointment will be made for routine tube change in 8 weeks. Electronically Signed   By: Aletta Edouard M.D.   On: 12/01/2016 12:57      IMPRESSION/PLAN: 1. 83 y.o. male with a newly diagnosed invasive high-grade urothelial carcinoma of the bladder with plasmacytoid variant involving the detrusor muscle. Today, we talked to the patient and his wife about the findings and work-up thus far.  We discussed the natural history of invasive, high-grade bladder cancer and general treatment, highlighting the role of radiotherapy in the management.  We discussed the available radiation techniques,  and focused on the details of logistics and delivery.  The recommendation is for concurrent chemoradiation delivered over a period of approximately 6 weeks (31 daily radiotherapy treatments). We reviewed the anticipated acute and late sequelae associated with radiation in this setting.  The patient was encouraged to ask questions that we answered to the best of our ability. The patient would like to proceed with concurrent chemoradiation and is tentatively scheduled for CT simulation on 12/17/2016 at 10 AM to follow after discussion at the multidisciplinary urology conference. We will call radiology to try to get his PET scan scheduled sooner in anticipation of receiving his results to help guide treatment planning before his simulation on the 14th. If his PET shows metastatic disease, Dr. Alen Blew has recommended to proceed with palliative chemotherapy alone.  We spent 60 minutes minutes face to face with the patient and more than 50% of that time was spent in counseling and/or coordination of care.   Nicholos Johns, PA-C    Tyler Pita, MD  [facility] Direct Dial: [phone]  Fax: [phone] .com  Skype  LinkedIn  This document serves as a record of services personally performed by Tyler Pita, MD and Freeman Caldron, PA-C. It was created on their behalf by Rae Lips, a trained medical scribe. The creation of this record is based on the scribe's personal observations and the providers' statements to them. This document has been checked and approved by the attending providers.

## 2016-12-10 ENCOUNTER — Telehealth: Payer: Self-pay | Admitting: *Deleted

## 2016-12-10 NOTE — Telephone Encounter (Signed)
Called patient to inform of Pet being moved to 12-14-16 - arrival time - 9 am @ [facility], lvm for a return call

## 2016-12-14 ENCOUNTER — Ambulatory Visit (HOSPITAL_COMMUNITY)
Admission: RE | Admit: 2016-12-14 | Discharge: 2016-12-14 | Disposition: A | Discharge status: Home / Self Care | Payer: Medicare Other | Source: Ambulatory Visit | Attending: Oncology | Admitting: Oncology

## 2016-12-14 DIAGNOSIS — C679 Malignant neoplasm of bladder, unspecified: Secondary | ICD-10-CM | POA: Diagnosis not present

## 2016-12-14 DIAGNOSIS — Z936 Other artificial openings of urinary tract status: Secondary | ICD-10-CM | POA: Insufficient documentation

## 2016-12-14 DIAGNOSIS — I2510 Atherosclerotic heart disease of native coronary artery without angina pectoris: Secondary | ICD-10-CM | POA: Insufficient documentation

## 2016-12-14 LAB — GLUCOSE, CAPILLARY: GLUCOSE-CAPILLARY: 109 mg/dL — AB (ref 65–99)

## 2016-12-14 MED ORDER — FLUDEOXYGLUCOSE F - 18 (FDG) INJECTION
7.4200 | Freq: Once | INTRAVENOUS | Status: AC | PRN
  Administered 2016-12-14: 7.42 via INTRAVENOUS

## 2016-12-15 ENCOUNTER — Telehealth: Payer: Self-pay | Admitting: *Deleted

## 2016-12-15 NOTE — Telephone Encounter (Signed)
Home number is not available to leave a message.

## 2016-12-15 NOTE — Telephone Encounter (Signed)
As noted below by Dr. Alen Blew, I tried calling the patient's cell phone and it is full of messages and not accepting new messages. Home number wasn't available either. If patient calls, he needs to know that his PET scan did not show any cancer other than the bladder.

## 2016-12-15 NOTE — Telephone Encounter (Signed)
-----   Message from Wyatt Portela, MD sent at 12/14/2016  2:19 PM EDT ----- Please let him know his PET did not show any cancer other than the bladder.

## 2016-12-17 ENCOUNTER — Telehealth: Payer: Self-pay | Admitting: Oncology

## 2016-12-17 ENCOUNTER — Ambulatory Visit
Admission: RE | Admit: 2016-12-17 | Discharge: 2016-12-17 | Disposition: A | Discharge status: Home / Self Care | Payer: Medicare Other | Source: Ambulatory Visit | Attending: Radiation Oncology | Admitting: Radiation Oncology

## 2016-12-17 ENCOUNTER — Other Ambulatory Visit: Payer: Self-pay | Admitting: Oncology

## 2016-12-17 ENCOUNTER — Other Ambulatory Visit: Payer: Self-pay | Admitting: Urology

## 2016-12-17 DIAGNOSIS — C679 Malignant neoplasm of bladder, unspecified: Secondary | ICD-10-CM

## 2016-12-17 DIAGNOSIS — Z510 Encounter for antineoplastic radiation therapy: Secondary | ICD-10-CM | POA: Diagnosis not present

## 2016-12-17 MED ORDER — OXYCODONE HCL 5 MG PO TABS: 5.0000 mg | ORAL_TABLET | ORAL | 0 refills | Status: DC | PRN

## 2016-12-17 NOTE — Progress Notes (Signed)
START OFF PATHWAY REGIMEN - Bladder   [id number]:Carboplatin AUC=2:   Administer weekly:     Carboplatin   **Always confirm dose/schedule in your pharmacy ordering system**    Patient Characteristics: Pre Cystectomy, Clinical T2-T4a, N0-1, M0, Cystectomy Ineligible/Patient Refuses Cystectomy AJCC M Category: M0 AJCC N Category: N0 AJCC T Category: T2 Current evidence of distant metastases? No AJCC 8 Stage Grouping: II Intent of Therapy: Curative Intent, Discussed with Patient 

## 2016-12-17 NOTE — Telephone Encounter (Signed)
lvm to inform pt of chemo class 9/18 at 0930 and added appts on 9/24 per sch msg

## 2016-12-17 NOTE — Progress Notes (Signed)
  Radiation Oncology         [phone] ________________________________  Name: Randall Little MRN: [id number]  Date: 12/17/2016  DOB: 12-Feb-1933  SIMULATION AND TREATMENT PLANNING NOTE    ICD-10-CM   1. Urothelial carcinoma of bladder ([place]) C67.9     DIAGNOSIS:  83 y.o. 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  Identity was confirmed.  All relevant records and images related to the planned course of therapy were reviewed.  The patient freely provided informed written consent to proceed with treatment after reviewing the details related to the planned course of therapy. The consent form was witnessed and verified by the simulation staff.  Then, the patient was set-up in a stable reproducible  supine position for radiation therapy.  Contrast was instilled into the bladder with a catheter under sterile conditions.  CT images were obtained.  Surface markings were placed.  The CT images were loaded into the planning software.  Then the target and avoidance structures were contoured.  Treatment planning then occurred.  The radiation prescription was entered and confirmed.  Then, I designed and supervised the construction of a total of 5 medically necessary complex treatment devices including VacLoc body positioner and 4 MLCs to shield the bowel and femoral necks.  I have requested : 3D Simulation  I have requested a DVH of the following structures: small bowel, rectum, left femoral head, right femoral head and targets.  SPECIAL TREATMENT PROCEDURE:  The planned course of therapy using radiation constitutes a special treatment procedure. Special care is required in the management of this patient for the following reasons. This treatment constitutes a Special Treatment Procedure for the following reason: [ Concurrent chemotherapy requiring careful monitoring for increased toxicities of  treatment including weekly laboratory values..  The special nature of the planned course of radiotherapy will require increased physician supervision and oversight to ensure patient's safety with optimal treatment outcomes.  PLAN:  The patient will receive 45 Gy in 25 fractions to the bladder and pelvic nodes, followed by a boost to the bladder tumor to a total dose of 64.8 Gy.  ________________________________  Sheral Apley Tammi Klippel, M.D.

## 2016-12-21 ENCOUNTER — Other Ambulatory Visit: Payer: Self-pay | Admitting: *Deleted

## 2016-12-21 ENCOUNTER — Encounter: Payer: Self-pay | Admitting: *Deleted

## 2016-12-21 ENCOUNTER — Encounter: Payer: Self-pay | Admitting: Oncology

## 2016-12-21 ENCOUNTER — Other Ambulatory Visit: Payer: Medicare Other

## 2016-12-21 ENCOUNTER — Ambulatory Visit (HOSPITAL_COMMUNITY): Payer: Medicare Other

## 2016-12-21 MED ORDER — PROCHLORPERAZINE MALEATE 10 MG PO TABS: 10.0000 mg | ORAL_TABLET | Freq: Four times a day (QID) | ORAL | 0 refills | Status: AC | PRN

## 2016-12-21 NOTE — Progress Notes (Signed)
Pt has 2 insurances so copay assistance for his chemo shouldnt be needed so I emailed Cindy in the radiation dept requesting she reach out to the pt to see if he can use the [facility] grant to assist with gas cards since hell be coming every day from [place] for radiation.

## 2016-12-22 DIAGNOSIS — Z510 Encounter for antineoplastic radiation therapy: Secondary | ICD-10-CM | POA: Diagnosis not present

## 2016-12-27 ENCOUNTER — Telehealth: Payer: Self-pay | Admitting: Oncology

## 2016-12-27 ENCOUNTER — Other Ambulatory Visit (HOSPITAL_BASED_OUTPATIENT_CLINIC_OR_DEPARTMENT_OTHER): Payer: Medicare Other

## 2016-12-27 ENCOUNTER — Ambulatory Visit
Admission: RE | Admit: 2016-12-27 | Discharge: 2016-12-27 | Disposition: A | Discharge status: Home / Self Care | Payer: Medicare Other | Source: Ambulatory Visit | Attending: Radiation Oncology | Admitting: Radiation Oncology

## 2016-12-27 ENCOUNTER — Ambulatory Visit (HOSPITAL_BASED_OUTPATIENT_CLINIC_OR_DEPARTMENT_OTHER): Payer: Medicare Other | Admitting: Oncology

## 2016-12-27 ENCOUNTER — Ambulatory Visit (HOSPITAL_BASED_OUTPATIENT_CLINIC_OR_DEPARTMENT_OTHER): Payer: Medicare Other

## 2016-12-27 VITALS — BP 117/63 | HR 82 | Temp 97.9°F | Resp 17 | Ht 67.0 in | Wt 146.6 lb

## 2016-12-27 DIAGNOSIS — Z5111 Encounter for antineoplastic chemotherapy: Secondary | ICD-10-CM | POA: Diagnosis present

## 2016-12-27 DIAGNOSIS — R5383 Other fatigue: Secondary | ICD-10-CM | POA: Diagnosis not present

## 2016-12-27 DIAGNOSIS — C679 Malignant neoplasm of bladder, unspecified: Secondary | ICD-10-CM | POA: Diagnosis present

## 2016-12-27 DIAGNOSIS — Z510 Encounter for antineoplastic radiation therapy: Secondary | ICD-10-CM | POA: Diagnosis not present

## 2016-12-27 LAB — COMPREHENSIVE METABOLIC PANEL
ALK PHOS: 98 U/L (ref 40–150)
ALT: 40 U/L (ref 0–55)
AST: 24 U/L (ref 5–34)
Albumin: 2.9 g/dL — ABNORMAL LOW (ref 3.5–5.0)
Anion Gap: 10 mEq/L (ref 3–11)
BUN: 16.3 mg/dL (ref 7.0–26.0)
CHLORIDE: 103 meq/L (ref 98–109)
CO2: 28 mEq/L (ref 22–29)
Calcium: 9.5 mg/dL (ref 8.4–10.4)
Creatinine: 1.3 mg/dL (ref 0.7–1.3)
EGFR: 51 mL/min/{1.73_m2} — ABNORMAL LOW (ref >90)
Glucose: 118 mg/dl (ref 70–140)
POTASSIUM: 4.1 meq/L (ref 3.5–5.1)
Sodium: 140 mEq/L (ref 136–145)
Total Bilirubin: 0.47 mg/dL (ref 0.20–1.20)
Total Protein: 6.9 g/dL (ref 6.4–8.3)

## 2016-12-27 LAB — CBC WITH DIFFERENTIAL/PLATELET
BASO%: 0.3 % (ref 0.0–2.0)
BASOS ABS: 0 10*3/uL (ref 0.0–0.1)
EOS%: 2.8 % (ref 0.0–7.0)
Eosinophils Absolute: 0.2 10*3/uL (ref 0.0–0.5)
HCT: 38 % — ABNORMAL LOW (ref 38.4–49.9)
HGB: 12 g/dL — ABNORMAL LOW (ref 13.0–17.1)
LYMPH%: 22.6 % (ref 14.0–49.0)
MCH: 29.4 pg (ref 27.2–33.4)
MCHC: 31.6 g/dL — AB (ref 32.0–36.0)
MCV: 93.1 fL (ref 79.3–98.0)
MONO#: 0.7 10*3/uL (ref 0.1–0.9)
MONO%: 10.1 % (ref 0.0–14.0)
NEUT#: 4.2 10*3/uL (ref 1.5–6.5)
NEUT%: 64.2 % (ref 39.0–75.0)
Platelets: 328 10*3/uL (ref 140–400)
RBC: 4.08 10*6/uL — ABNORMAL LOW (ref 4.20–5.82)
RDW: 13.6 % (ref 11.0–14.6)
WBC: 6.5 10*3/uL (ref 4.0–10.3)
lymph#: 1.5 10*3/uL (ref 0.9–3.3)

## 2016-12-27 MED ORDER — PALONOSETRON HCL INJECTION 0.25 MG/5ML
0.2500 mg | Freq: Once | INTRAVENOUS | Status: AC
  Administered 2016-12-27: 0.25 mg via INTRAVENOUS

## 2016-12-27 MED ORDER — PALONOSETRON HCL INJECTION 0.25 MG/5ML
INTRAVENOUS | Status: AC
  Filled 2016-12-27: qty 5

## 2016-12-27 MED ORDER — SODIUM CHLORIDE 0.9 % IV SOLN
Freq: Once | INTRAVENOUS | Status: AC
  Administered 2016-12-27: 10:00:00 via INTRAVENOUS

## 2016-12-27 MED ORDER — DEXAMETHASONE SODIUM PHOSPHATE 10 MG/ML IJ SOLN
INTRAMUSCULAR | Status: AC
  Filled 2016-12-27: qty 1

## 2016-12-27 MED ORDER — DEXAMETHASONE SODIUM PHOSPHATE 10 MG/ML IJ SOLN
10.0000 mg | Freq: Once | INTRAMUSCULAR | Status: AC
  Administered 2016-12-27: 10 mg via INTRAVENOUS

## 2016-12-27 MED ORDER — CARBOPLATIN CHEMO INJECTION 450 MG/45ML
133.2000 mg | Freq: Once | INTRAVENOUS | Status: AC
  Administered 2016-12-27: 130 mg via INTRAVENOUS
  Filled 2016-12-27: qty 13

## 2016-12-27 NOTE — Telephone Encounter (Signed)
Scheduled appt per 9/24 los - patient t get a new schedule in the treatment area.

## 2016-12-27 NOTE — Progress Notes (Signed)
Hematology and Oncology Follow Up Visit  Randall Little [id number] Jun 14, 1932 84 y.o. 12/27/2016 9:33 AM Randall Little, MDElkins, Curt Little, *   Principle Diagnosis: 83 year old gentleman high-grade urothelial carcinoma, plasmacytoid variant diagnosed in July 2018. He presented with bilateral hydronephrosis and acute renal failure.   Prior Therapy: He is status post TURBT on 11/11/2016 confirmed the presence of muscle invasive disease. PET scan on 12/14/2016 showed no disease outside the bladder.  He has bilateral nephrostomy tube in place.  Current therapy: Radiation therapy with Carboplatin weekly. Week one will be on 12/27/2016  Interim History: Randall Little presents today for a follow-up visit. Since last visit, he had a PET scan on 12/14/2016 which showed no evidence of metastatic disease. His case was discussed in the genitourinary tumor board as well as Dr. Diona Little. It was felt that given the aggressive nature of his malignancy and his decline, he is not a surgical candidate. He is ready to proceed with radiation and chemotherapy instead.   Clinically he reports some decline in his overall health. His appetite has been poor and is feeling more fatigued and weak. He denied any falls or syncope. He denied any hematuria or dysuria. He still has a nephrostomy tube in place.  He does not report any headaches, blurry vision, syncope or seizures. He does not report any fevers, chills or sweats. He does not report any cough, wheezing or hemoptysis. He does not report any chest pain, palpitation, orthopnea. He does not report any nausea, vomiting or abdominal pain. He does not report any constipation or diarrhea. He does not report any hematuria or dysuria. Remaining review of systems unremarkable  Medications: I have reviewed the patient's current medications.  Current Outpatient Prescriptions  Medication Sig Dispense Refill  . metoprolol succinate (TOPROL-XL) 50 MG 24 hr tablet TAKE  1 TABLET BY MOUTH ONCE DAILY .  TAKE  WITH  OR  IMMEDIATELY  FOLLOWING  A  MEAL 90 tablet 2  . Multiple Vitamin (MULTIVITAMIN WITH MINERALS) TABS tablet Take 1 tablet by mouth daily.    [name] oxyCODONE (OXY IR/ROXICODONE) 5 MG immediate release tablet Take 1 tablet (5 mg total) by mouth every 4 (four) hours as needed for moderate pain. 60 tablet 0  . prochlorperazine (COMPAZINE) 10 MG tablet Take 1 tablet (10 mg total) by mouth every 6 (six) hours as needed for nausea or vomiting. 30 tablet 0  . zolpidem (AMBIEN) 5 MG tablet   0   No current facility-administered medications for this visit.      Allergies:  Allergies  Allergen Reactions  . Penicillin G Rash    Has patient had a PCN reaction causing immediate rash, facial/tongue/throat swelling, SOB or lightheadedness with hypotension: Yes Has patient had a PCN reaction causing severe rash involving mucus membranes or skin necrosis: No Has patient had a PCN reaction that required hospitalization: No Has patient had a PCN reaction occurring within the last 10 years: Yes If all of the above answers are "NO", then may proceed with Cephalosporin use.    Past Medical History, Surgical history, Social history, and Family History were reviewed and updated.  [name] Physical Exam: Blood pressure 117/63, pulse 82, temperature 97.9 F (36.6 C), temperature source Oral, resp. rate 17, height 5\' 7"  (1.702 m), weight 146 lb 9.6 oz (66.5 kg), SpO2 100 %. ECOG: 1 General appearance: alert and cooperative appeared in mild distress. Head: Normocephalic, without obvious abnormality without oral thrush. Neck: no adenopathy Lymph nodes: Cervical, supraclavicular, and axillary  nodes normal. Heart:regular rate and rhythm, S1, S2 normal, no murmur, click, rub or gallop Lung:chest clear, no wheezing, rales, normal symmetric air entry.  Abdomin: soft, non-tender, without masses or organomegaly EXT:no erythema, induration, or nodules   Lab Results: Lab Results   Component Value Date   WBC 6.5 12/27/2016   HGB 12.0 (L) 12/27/2016   HCT 38.0 (L) 12/27/2016   MCV 93.1 12/27/2016   PLT 328 12/27/2016     Chemistry      Component Value Date/Time   NA 140 12/27/2016 0853   K 4.1 12/27/2016 0853   CL 104 11/04/2016 1118   CO2 28 12/27/2016 0853   BUN 16.3 12/27/2016 0853   CREATININE 1.3 12/27/2016 0853      Component Value Date/Time   CALCIUM 9.5 12/27/2016 0853   ALKPHOS 98 12/27/2016 0853   AST 24 12/27/2016 0853   ALT 40 12/27/2016 0853   BILITOT 0.47 12/27/2016 0853     EXAM: NUCLEAR MEDICINE PET SKULL BASE TO THIGH  TECHNIQUE: 7.42 mCi F-18 FDG was injected intravenously. Full-ring PET imaging was performed from the skull base to thigh after the radiotracer. CT data was obtained and used for attenuation correction and anatomic localization.  FASTING BLOOD GLUCOSE:  Value: 109 mg/dl  COMPARISON:  CT scan 10/22/2016  FINDINGS: NECK: No hypermetabolic lymph nodes in the neck.  CHEST: No mediastinal or hilar lymphadenopathy. No worrisome pulmonary lesions to suggest pulmonary metastatic disease. No acute pulmonary findings. Bibasilar atelectasis and/or scarring changes.  Tortuosity, ectasia and calcification of the thoracic aorta along with three-vessel coronary artery calcifications.  ABDOMEN/PELVIS: No abnormal hypermetabolic activity within the liver, pancreas, adrenal glands, or spleen. No hypermetabolic lymph nodes in the abdomen or pelvis.  Simple appearing hepatic cysts. Status post cholecystectomy without biliary dilatation.  Bilateral percutaneous nephrostomy tubes are in place decompressing the collecting systems. Both ureters are slightly dilated.  Moderate asymmetric but fairly diffuse bladder wall thickening demonstrated hypermetabolism consistent with patient's known bladder cancer. SUV max is 5.75  Focus of abnormal FDG uptake in the prostate gland. SUV max is 7.6. Could not exclude a  focus of prostate cancer. Recommend correlation with PSA level.  SKELETON: A few small scattered bone lesions are noted which are likely benign bone islands. No areas of hypermetabolism to suggest osseous metastatic disease. New  There is a lesion in the left humeral head and neck area which is most likely the 18 bone infarct and adjacent cystic change. No hypermetabolism to suggest malignancy.  IMPRESSION: 1. Diffuse but asymmetric bladder wall thickening and hypermetabolism consistent with patient's known bladder cancer. 2. No definite findings for regional lymphadenopathy or metastatic disease. 3. Focus of abnormal FDG uptake in the prostate gland possibly a focus of prostate cancer. Recommend correlation with PSA level. 4. Advanced atherosclerotic calcifications involving the thoracic and abdominal aorta and branch vessels including the coronary arteries. 5. Bilateral percutaneous nephrostomy catheters decompressing the kidneys.   Impression and Plan:  83 year old gentleman with the following issues:  1. High-grade urothelial carcinoma, plasmacytoid variant diagnosed in July 2018. He presented with bilateral hydronephrosis and acute renal failure. He is status post TURBT on 11/11/2016 confirmed the presence of muscle invasive disease. PET/CT scan on 12/14/2016 was reviewed and discussed with the patient and showed no evidence of metastatic disease.  The natural course of this disease was discussed today with the patient. He appears to be clinically declining and overall a poor candidate for surgery given the aggressive nature of this cancer and  the likelihood of micrometastatic disease. He understands that even with palliative radiation and chemotherapy is at risk of developing metastatic disease. I also offered him hospice care instead which would be a reasonable option given his overall decline.  After discussion today, he would like to try aggressive therapy and willing  to try radiation with carboplatin. Complication associated with this therapy include nausea, fatigue, myelosuppression, among others. The goal of therapy is palliative at this point.  2. IV access: Risks and benefits of Port-A-Cath insertion was discussed today and will like to have that done. We will arrange for this to be placed in the near future.  3. Nausea prophylaxis: Prescription for Compazine is available to the patient.  4. Follow-up: Will be in one week for his next week of therapy.   Zola Button, MD 9/24/20189:33 AM

## 2016-12-27 NOTE — Patient Instructions (Addendum)
[facility] Discharge Instructions for Patients Receiving Chemotherapy  Today you received the following chemotherapy agents carboplatin   To help prevent nausea and vomiting after your treatment, we encourage you to take your nausea medication as direcred. If you develop nausea and vomiting that is not controlled by your nausea medication, call the clinic.   BELOW ARE SYMPTOMS THAT SHOULD BE REPORTED IMMEDIATELY:  *FEVER GREATER THAN 100.5 F  *CHILLS WITH OR WITHOUT FEVER  NAUSEA AND VOMITING THAT IS NOT CONTROLLED WITH YOUR NAUSEA MEDICATION  *UNUSUAL SHORTNESS OF BREATH  *UNUSUAL BRUISING OR BLEEDING  TENDERNESS IN MOUTH AND THROAT WITH OR WITHOUT PRESENCE OF ULCERS  *URINARY PROBLEMS  *BOWEL PROBLEMS  UNUSUAL RASH Items with * indicate a potential emergency and should be followed up as soon as possible.  Feel free to call the clinic you have any questions or concerns. The clinic phone number is [phone].  Carboplatin injection What is this medicine? CARBOPLATIN (KAR boe pla tin) is a chemotherapy drug. It targets fast dividing cells, like cancer cells, and causes these cells to die. This medicine is used to treat ovarian cancer and many other cancers. This medicine may be used for other purposes; ask your health care provider or pharmacist if you have questions. COMMON BRAND NAME(S): Paraplatin What should I tell my health care provider before I take this medicine? They need to know if you have any of these conditions: -blood disorders -hearing problems -kidney disease -recent or ongoing radiation therapy -an unusual or allergic reaction to carboplatin, cisplatin, other chemotherapy, other medicines, foods, dyes, or preservatives -pregnant or trying to get pregnant -breast-feeding How should I use this medicine? This drug is usually given as an infusion into a vein. It is administered in a hospital or clinic by a specially trained health care  professional. Talk to your pediatrician regarding the use of this medicine in children. Special care may be needed. Overdosage: If you think you have taken too much of this medicine contact a poison control center or emergency room at once. NOTE: This medicine is only for you. Do not share this medicine with others. What if I miss a dose? It is important not to miss a dose. Call your doctor or health care professional if you are unable to keep an appointment. What may interact with this medicine? -medicines for seizures -medicines to increase blood counts like filgrastim, pegfilgrastim, sargramostim -some antibiotics like amikacin, gentamicin, neomycin, streptomycin, tobramycin -vaccines Talk to your doctor or health care professional before taking any of these medicines: -acetaminophen -aspirin -ibuprofen -ketoprofen -naproxen This list may not describe all possible interactions. Give your health care provider a list of all the medicines, herbs, non-prescription drugs, or dietary supplements you use. Also tell them if you smoke, drink alcohol, or use illegal drugs. Some items may interact with your medicine. What should I watch for while using this medicine? Your condition will be monitored carefully while you are receiving this medicine. You will need important blood work done while you are taking this medicine. This drug may make you feel generally unwell. This is not uncommon, as chemotherapy can affect healthy cells as well as cancer cells. Report any side effects. Continue your course of treatment even though you feel ill unless your doctor tells you to stop. In some cases, you may be given additional medicines to help with side effects. Follow all directions for their use. Call your doctor or health care professional for advice if you get a fever,  chills or sore throat, or other symptoms of a cold or flu. Do not treat yourself. This drug decreases your body's ability to fight infections.  Try to avoid being around people who are sick. This medicine may increase your risk to bruise or bleed. Call your doctor or health care professional if you notice any unusual bleeding. Be careful brushing and flossing your teeth or using a toothpick because you may get an infection or bleed more easily. If you have any dental work done, tell your dentist you are receiving this medicine. Avoid taking products that contain aspirin, acetaminophen, ibuprofen, naproxen, or ketoprofen unless instructed by your doctor. These medicines may hide a fever. Do not become pregnant while taking this medicine. Women should inform their doctor if they wish to become pregnant or think they might be pregnant. There is a potential for serious side effects to an unborn child. Talk to your health care professional or pharmacist for more information. Do not breast-feed an infant while taking this medicine. What side effects may I notice from receiving this medicine? Side effects that you should report to your doctor or health care professional as soon as possible: -allergic reactions like skin rash, itching or hives, swelling of the face, lips, or tongue -signs of infection - fever or chills, cough, sore throat, pain or difficulty passing urine -signs of decreased platelets or bleeding - bruising, pinpoint red spots on the skin, black, tarry stools, nosebleeds -signs of decreased red blood cells - unusually weak or tired, fainting spells, lightheadedness -breathing problems -changes in hearing -changes in vision -chest pain -high blood pressure -low blood counts - This drug may decrease the number of white blood cells, red blood cells and platelets. You may be at increased risk for infections and bleeding. -nausea and vomiting -pain, swelling, redness or irritation at the injection site -pain, tingling, numbness in the hands or feet -problems with balance, talking, walking -trouble passing urine or change in the  amount of urine Side effects that usually do not require medical attention (report to your doctor or health care professional if they continue or are bothersome): -hair loss -loss of appetite -metallic taste in the mouth or changes in taste This list may not describe all possible side effects. Call your doctor for medical advice about side effects. You may report side effects to FDA at 1-800-FDA-1088. Where should I keep my medicine? This drug is given in a hospital or clinic and will not be stored at home. NOTE: This sheet is a summary. It may not cover all possible information. If you have questions about this medicine, talk to your doctor, pharmacist, or health care provider.  2018 Elsevier/Gold Standard (2007-06-27 14:38:05)

## 2016-12-28 ENCOUNTER — Ambulatory Visit
Admission: RE | Admit: 2016-12-28 | Discharge: 2016-12-28 | Disposition: A | Discharge status: Home / Self Care | Payer: Medicare Other | Source: Ambulatory Visit | Attending: Radiation Oncology | Admitting: Radiation Oncology

## 2016-12-28 DIAGNOSIS — Z510 Encounter for antineoplastic radiation therapy: Secondary | ICD-10-CM | POA: Diagnosis not present

## 2016-12-29 ENCOUNTER — Telehealth: Payer: Self-pay | Admitting: *Deleted

## 2016-12-29 ENCOUNTER — Ambulatory Visit
Admission: RE | Admit: 2016-12-29 | Discharge: 2016-12-29 | Disposition: A | Discharge status: Home / Self Care | Payer: Medicare Other | Source: Ambulatory Visit | Attending: Radiation Oncology | Admitting: Radiation Oncology

## 2016-12-29 DIAGNOSIS — Z510 Encounter for antineoplastic radiation therapy: Secondary | ICD-10-CM | POA: Diagnosis not present

## 2016-12-29 NOTE — Telephone Encounter (Signed)
-----   Message from Tami Lin, RN sent at 12/27/2016 12:22 PM EDT ----- Regarding: First time chemo/ [facility]  First time carboplatin.  Tolerated well. Dr. Alen Blew.

## 2016-12-29 NOTE — Telephone Encounter (Signed)
Returned patient's call and got answering machine again. LM for patient to call me with any problems.

## 2016-12-29 NOTE — Telephone Encounter (Signed)
LM for patient to call desk nurse. Re: chemo follow up

## 2016-12-30 ENCOUNTER — Ambulatory Visit
Admission: RE | Admit: 2016-12-30 | Discharge: 2016-12-30 | Disposition: A | Discharge status: Home / Self Care | Payer: Medicare Other | Source: Ambulatory Visit | Attending: Radiation Oncology | Admitting: Radiation Oncology

## 2016-12-30 DIAGNOSIS — Z510 Encounter for antineoplastic radiation therapy: Secondary | ICD-10-CM | POA: Diagnosis not present

## 2016-12-31 ENCOUNTER — Ambulatory Visit
Admission: RE | Admit: 2016-12-31 | Discharge: 2016-12-31 | Disposition: A | Discharge status: Home / Self Care | Payer: Medicare Other | Source: Ambulatory Visit | Attending: Radiation Oncology | Admitting: Radiation Oncology

## 2016-12-31 DIAGNOSIS — Z510 Encounter for antineoplastic radiation therapy: Secondary | ICD-10-CM | POA: Diagnosis not present

## 2017-01-03 ENCOUNTER — Ambulatory Visit
Admission: RE | Admit: 2017-01-03 | Discharge: 2017-01-03 | Disposition: A | Discharge status: Home / Self Care | Payer: Medicare Other | Source: Ambulatory Visit | Attending: Radiation Oncology | Admitting: Radiation Oncology

## 2017-01-03 DIAGNOSIS — Z510 Encounter for antineoplastic radiation therapy: Secondary | ICD-10-CM | POA: Diagnosis not present

## 2017-01-04 ENCOUNTER — Ambulatory Visit (HOSPITAL_BASED_OUTPATIENT_CLINIC_OR_DEPARTMENT_OTHER): Payer: Medicare Other

## 2017-01-04 ENCOUNTER — Ambulatory Visit
Admission: RE | Admit: 2017-01-04 | Discharge: 2017-01-04 | Disposition: A | Discharge status: Home / Self Care | Payer: Medicare Other | Source: Ambulatory Visit | Attending: Radiation Oncology | Admitting: Radiation Oncology

## 2017-01-04 ENCOUNTER — Other Ambulatory Visit (HOSPITAL_BASED_OUTPATIENT_CLINIC_OR_DEPARTMENT_OTHER): Payer: Medicare Other

## 2017-01-04 ENCOUNTER — Other Ambulatory Visit: Payer: Medicare Other

## 2017-01-04 VITALS — BP 104/54 | HR 72 | Temp 98.2°F | Resp 17

## 2017-01-04 DIAGNOSIS — Z510 Encounter for antineoplastic radiation therapy: Secondary | ICD-10-CM | POA: Diagnosis not present

## 2017-01-04 DIAGNOSIS — C679 Malignant neoplasm of bladder, unspecified: Secondary | ICD-10-CM

## 2017-01-04 DIAGNOSIS — Z5111 Encounter for antineoplastic chemotherapy: Secondary | ICD-10-CM | POA: Diagnosis present

## 2017-01-04 LAB — COMPREHENSIVE METABOLIC PANEL
ALT: 29 U/L (ref 0–55)
AST: 21 U/L (ref 5–34)
Albumin: 2.9 g/dL — ABNORMAL LOW (ref 3.5–5.0)
Alkaline Phosphatase: 85 U/L (ref 40–150)
Anion Gap: 10 mEq/L (ref 3–11)
BUN: 25.2 mg/dL (ref 7.0–26.0)
CHLORIDE: 102 meq/L (ref 98–109)
CO2: 26 meq/L (ref 22–29)
Calcium: 9.1 mg/dL (ref 8.4–10.4)
Creatinine: 1.4 mg/dL — ABNORMAL HIGH (ref 0.7–1.3)
EGFR: 47 mL/min/{1.73_m2} — AB (ref >90)
GLUCOSE: 128 mg/dL (ref 70–140)
POTASSIUM: 4.1 meq/L (ref 3.5–5.1)
SODIUM: 138 meq/L (ref 136–145)
TOTAL PROTEIN: 6.9 g/dL (ref 6.4–8.3)
Total Bilirubin: 0.86 mg/dL (ref 0.20–1.20)

## 2017-01-04 LAB — CBC WITH DIFFERENTIAL/PLATELET
BASO%: 0.6 % (ref 0.0–2.0)
Basophils Absolute: 0 10*3/uL (ref 0.0–0.1)
EOS ABS: 0.1 10*3/uL (ref 0.0–0.5)
EOS%: 1.1 % (ref 0.0–7.0)
HCT: 34 % — ABNORMAL LOW (ref 38.4–49.9)
HGB: 11.2 g/dL — ABNORMAL LOW (ref 13.0–17.1)
LYMPH%: 9.1 % — AB (ref 14.0–49.0)
MCH: 29.7 pg (ref 27.2–33.4)
MCHC: 32.9 g/dL (ref 32.0–36.0)
MCV: 90.1 fL (ref 79.3–98.0)
MONO#: 0.7 10*3/uL (ref 0.1–0.9)
MONO%: 11.8 % (ref 0.0–14.0)
NEUT%: 77.4 % — ABNORMAL HIGH (ref 39.0–75.0)
NEUTROS ABS: 4.3 10*3/uL (ref 1.5–6.5)
Platelets: 249 10*3/uL (ref 140–400)
RBC: 3.77 10*6/uL — AB (ref 4.20–5.82)
RDW: 14.1 % (ref 11.0–14.6)
WBC: 5.6 10*3/uL (ref 4.0–10.3)
lymph#: 0.5 10*3/uL — ABNORMAL LOW (ref 0.9–3.3)

## 2017-01-04 MED ORDER — SODIUM CHLORIDE 0.9 % IV SOLN
127.4000 mg | Freq: Once | INTRAVENOUS | Status: AC
  Administered 2017-01-04: 130 mg via INTRAVENOUS
  Filled 2017-01-04: qty 13

## 2017-01-04 MED ORDER — PALONOSETRON HCL INJECTION 0.25 MG/5ML
INTRAVENOUS | Status: AC
  Filled 2017-01-04: qty 5

## 2017-01-04 MED ORDER — SODIUM CHLORIDE 0.9 % IV SOLN
Freq: Once | INTRAVENOUS | Status: AC
  Administered 2017-01-04: 11:00:00 via INTRAVENOUS

## 2017-01-04 MED ORDER — DEXAMETHASONE SODIUM PHOSPHATE 10 MG/ML IJ SOLN
INTRAMUSCULAR | Status: AC
  Filled 2017-01-04: qty 1

## 2017-01-04 MED ORDER — DEXAMETHASONE SODIUM PHOSPHATE 10 MG/ML IJ SOLN
10.0000 mg | Freq: Once | INTRAMUSCULAR | Status: AC
  Administered 2017-01-04: 10 mg via INTRAVENOUS

## 2017-01-04 MED ORDER — PALONOSETRON HCL INJECTION 0.25 MG/5ML
0.2500 mg | Freq: Once | INTRAVENOUS | Status: AC
  Administered 2017-01-04: 0.25 mg via INTRAVENOUS

## 2017-01-04 NOTE — Patient Instructions (Signed)
[facility] Discharge Instructions for Patients Receiving Chemotherapy  Today you received the following chemotherapy agents : Carboplatin    To help prevent nausea and vomiting after your treatment, we encourage you to take your nausea medication as direcred. If you develop nausea and vomiting that is not controlled by your nausea medication, call the clinic.   BELOW ARE SYMPTOMS THAT SHOULD BE REPORTED IMMEDIATELY:  *FEVER GREATER THAN 100.5 F  *CHILLS WITH OR WITHOUT FEVER  NAUSEA AND VOMITING THAT IS NOT CONTROLLED WITH YOUR NAUSEA MEDICATION  *UNUSUAL SHORTNESS OF BREATH  *UNUSUAL BRUISING OR BLEEDING  TENDERNESS IN MOUTH AND THROAT WITH OR WITHOUT PRESENCE OF ULCERS  *URINARY PROBLEMS  *BOWEL PROBLEMS  UNUSUAL RASH Items with * indicate a potential emergency and should be followed up as soon as possible.  Feel free to call the clinic you have any questions or concerns. The clinic phone number is [phone].

## 2017-01-05 ENCOUNTER — Ambulatory Visit
Admission: RE | Admit: 2017-01-05 | Discharge: 2017-01-05 | Disposition: A | Discharge status: Home / Self Care | Payer: Medicare Other | Source: Ambulatory Visit | Attending: Radiation Oncology | Admitting: Radiation Oncology

## 2017-01-05 DIAGNOSIS — Z510 Encounter for antineoplastic radiation therapy: Secondary | ICD-10-CM | POA: Diagnosis not present

## 2017-01-06 ENCOUNTER — Ambulatory Visit
Admission: RE | Admit: 2017-01-06 | Discharge: 2017-01-06 | Disposition: A | Discharge status: Home / Self Care | Payer: Medicare Other | Source: Ambulatory Visit | Attending: Radiation Oncology | Admitting: Radiation Oncology

## 2017-01-06 ENCOUNTER — Other Ambulatory Visit: Payer: Self-pay | Admitting: Radiology

## 2017-01-06 DIAGNOSIS — Z510 Encounter for antineoplastic radiation therapy: Secondary | ICD-10-CM | POA: Diagnosis not present

## 2017-01-07 ENCOUNTER — Encounter (HOSPITAL_COMMUNITY): Payer: Self-pay

## 2017-01-07 ENCOUNTER — Ambulatory Visit (HOSPITAL_COMMUNITY)
Admission: RE | Admit: 2017-01-07 | Discharge: 2017-01-07 | Disposition: A | Discharge status: Home / Self Care | Payer: Medicare Other | Source: Ambulatory Visit | Attending: Oncology | Admitting: Oncology

## 2017-01-07 ENCOUNTER — Ambulatory Visit
Admission: RE | Admit: 2017-01-07 | Discharge: 2017-01-07 | Disposition: A | Discharge status: Home / Self Care | Payer: Medicare Other | Source: Ambulatory Visit | Attending: Radiation Oncology | Admitting: Radiation Oncology

## 2017-01-07 ENCOUNTER — Other Ambulatory Visit: Payer: Self-pay | Admitting: Oncology

## 2017-01-07 DIAGNOSIS — E785 Hyperlipidemia, unspecified: Secondary | ICD-10-CM | POA: Insufficient documentation

## 2017-01-07 DIAGNOSIS — I878 Other specified disorders of veins: Secondary | ICD-10-CM | POA: Insufficient documentation

## 2017-01-07 DIAGNOSIS — Z880 Allergy status to penicillin: Secondary | ICD-10-CM | POA: Insufficient documentation

## 2017-01-07 DIAGNOSIS — C679 Malignant neoplasm of bladder, unspecified: Secondary | ICD-10-CM | POA: Diagnosis not present

## 2017-01-07 DIAGNOSIS — Z510 Encounter for antineoplastic radiation therapy: Secondary | ICD-10-CM | POA: Diagnosis not present

## 2017-01-07 DIAGNOSIS — Z936 Other artificial openings of urinary tract status: Secondary | ICD-10-CM | POA: Diagnosis not present

## 2017-01-07 HISTORY — PX: IR FLUORO GUIDE PORT INSERTION RIGHT: IMG5741

## 2017-01-07 HISTORY — PX: IR US GUIDE VASC ACCESS RIGHT: IMG2390

## 2017-01-07 LAB — CBC
HCT: 36.2 % — ABNORMAL LOW (ref 39.0–52.0)
HEMOGLOBIN: 11.6 g/dL — AB (ref 13.0–17.0)
MCH: 29.8 pg (ref 26.0–34.0)
MCHC: 32 g/dL (ref 30.0–36.0)
MCV: 93.1 fL (ref 78.0–100.0)
PLATELETS: 229 10*3/uL (ref 150–400)
RBC: 3.89 MIL/uL — ABNORMAL LOW (ref 4.22–5.81)
RDW: 14.4 % (ref 11.5–15.5)
WBC: 4.6 10*3/uL (ref 4.0–10.5)

## 2017-01-07 LAB — APTT: APTT: 32 s (ref 24–36)

## 2017-01-07 LAB — BASIC METABOLIC PANEL
Anion gap: 14 (ref 5–15)
BUN: 32 mg/dL — AB (ref 6–20)
CO2: 27 mmol/L (ref 22–32)
CREATININE: 1.38 mg/dL — AB (ref 0.61–1.24)
Calcium: 9 mg/dL (ref 8.9–10.3)
Chloride: 100 mmol/L — ABNORMAL LOW (ref 101–111)
GFR calc Af Amer: 53 mL/min — ABNORMAL LOW (ref >60)
GFR, EST NON AFRICAN AMERICAN: 46 mL/min — AB (ref >60)
Glucose, Bld: 98 mg/dL (ref 65–99)
POTASSIUM: 4.3 mmol/L (ref 3.5–5.1)
SODIUM: 141 mmol/L (ref 135–145)

## 2017-01-07 LAB — PROTIME-INR
INR: 0.98
PROTHROMBIN TIME: 12.9 s (ref 11.4–15.2)

## 2017-01-07 MED ORDER — LIDOCAINE-EPINEPHRINE (PF) 2 %-1:200000 IJ SOLN
INTRAMUSCULAR | Status: AC
  Filled 2017-01-07: qty 20

## 2017-01-07 MED ORDER — VANCOMYCIN HCL IN DEXTROSE 1-5 GM/200ML-% IV SOLN
1000.0000 mg | INTRAVENOUS | Status: AC
  Administered 2017-01-07: 1000 mg via INTRAVENOUS

## 2017-01-07 MED ORDER — FENTANYL CITRATE (PF) 100 MCG/2ML IJ SOLN
INTRAMUSCULAR | Status: AC
Start: 2017-01-07 — End: 2017-01-08
  Filled 2017-01-07: qty 4

## 2017-01-07 MED ORDER — LIDOCAINE-EPINEPHRINE (PF) 2 %-1:200000 IJ SOLN
INTRAMUSCULAR | Status: AC | PRN
  Administered 2017-01-07: 20 mL

## 2017-01-07 MED ORDER — VANCOMYCIN HCL IN DEXTROSE 1-5 GM/200ML-% IV SOLN
INTRAVENOUS | Status: AC
  Filled 2017-01-07: qty 200

## 2017-01-07 MED ORDER — MIDAZOLAM HCL 2 MG/2ML IJ SOLN
INTRAMUSCULAR | Status: AC | PRN
  Administered 2017-01-07 (×2): 1 mg via INTRAVENOUS

## 2017-01-07 MED ORDER — FENTANYL CITRATE (PF) 100 MCG/2ML IJ SOLN
INTRAMUSCULAR | Status: AC | PRN
  Administered 2017-01-07 (×2): 50 ug via INTRAVENOUS

## 2017-01-07 MED ORDER — HEPARIN SOD (PORK) LOCK FLUSH 100 UNIT/ML IV SOLN
INTRAVENOUS | Status: AC
  Filled 2017-01-07: qty 5

## 2017-01-07 MED ORDER — SODIUM CHLORIDE 0.9 % IV SOLN
INTRAVENOUS | Status: DC
  Administered 2017-01-07: 13:00:00 via INTRAVENOUS

## 2017-01-07 MED ORDER — MIDAZOLAM HCL 2 MG/2ML IJ SOLN
INTRAMUSCULAR | Status: AC
  Filled 2017-01-07: qty 4

## 2017-01-07 NOTE — Sedation Documentation (Signed)
Patient denies pain and is resting comfortably.  

## 2017-01-07 NOTE — H&P (Signed)
Referring Physician(s): Wyatt Portela  Supervising Physician: Sandi Mariscal  Patient Status:  [facility] OP  Chief Complaint:  "I'm getting a port"  Subjective: Patient familiar to IR service from prior bilateral percutaneous nephrostomy tube placements on 10/23/16. The tubes were last exchanged on 12/01/16. He has a history of high-grade bladder carcinoma diagnosed initially in July of this year. He is currently undergoing chemoradiation. He has poor venous access and presents today for Port-A-Cath placement. He denies fever, headache, chest pain, dyspnea, cough, abdominal/back pain,  vomiting or bleeding. He is hard of hearing and has had weight loss. He does have intermittent nausea after eating. Past Medical History:  Diagnosis Date  . Arrhythmia    Tachycardia  . Bladder cancer ([facility] [place])   . Dyslipidemia   . Dysrhythmia   . History of kidney stones   . Skin cancer    removed from face  . SOB (shortness of breath)    Past Surgical History:  Procedure Laterality Date  . CYSTOSCOPY W/ RETROGRADES Bilateral 11/11/2016   Procedure: CYSTOSCOPY WITH TURBT;  Surgeon: Franchot Gallo, MD;  Location: [facility] ORS;  Service: Urology;  Laterality: Bilateral;  . gall stone removal    . IR NEPHROSTOMY EXCHANGE RIGHT  12/01/2016  . IR NEPHROSTOMY PLACEMENT LEFT  10/23/2016  . IR NEPHROSTOMY PLACEMENT LEFT  12/01/2016  . IR NEPHROSTOMY PLACEMENT RIGHT  10/23/2016     Allergies: Penicillin g  Medications: Prior to Admission medications   Medication Sig Start Date End Date Taking? Authorizing Provider  metoprolol succinate (TOPROL-XL) 50 MG 24 hr tablet TAKE 1 TABLET BY MOUTH ONCE DAILY .  TAKE  WITH  OR  IMMEDIATELY  FOLLOWING  A  MEAL 11/09/16  Yes Nahser, Wonda Cheng, MD  Multiple Vitamin (MULTIVITAMIN WITH MINERALS) TABS tablet Take 1 tablet by mouth daily.   Yes [provider]  oxyCODONE (OXY IR/ROXICODONE) 5 MG immediate release tablet Take 1 tablet (5 mg total) by mouth every 4 (four) hours  as needed for moderate pain. 12/17/16  Yes Bruning, Ashlyn, PA-C  prochlorperazine (COMPAZINE) 10 MG tablet Take 1 tablet (10 mg total) by mouth every 6 (six) hours as needed for nausea or vomiting. 12/21/16  Yes Wyatt Portela, MD  zolpidem (AMBIEN) 5 MG tablet  12/04/16   [provider]     Vital Signs: BP 107/70   Pulse 74   Temp (!) 97.5 F (36.4 C) (Oral)   Resp 16   Ht 5\' 7"  (1.702 m)   Wt 141 lb 4 oz (64.1 kg)   SpO2 97%   BMI 22.12 kg/m   Physical Exam awake, alert. Chest clear to auscultation bilaterally. Heart with regular rate and rhythm. Abdomen soft, positive bowel sounds, nontender. Bilateral PCNs intact, dressings dry, sites nontender, draining yellow urine; no lower extremity edema  Imaging: No results found.  Labs:  CBC:  Recent Labs  12/07/16 1504 12/27/16 0853 01/04/17 1005 01/07/17 1258  WBC 6.7 6.5 5.6 4.6  HGB 11.8* 12.0* 11.2* 11.6*  HCT 36.8* 38.0* 34.0* 36.2*  PLT 251 328 249 229    COAGS:  Recent Labs  10/23/16 0758 01/07/17 1258  INR 1.22 0.98  APTT 34 32    BMP:  Recent Labs  10/24/16 0307 10/25/16 0526 11/04/16 1118 12/07/16 1504 12/27/16 0853 01/04/17 1005 01/07/17 1258  NA 144 146* 140 138 140 138 141  K 4.1 3.6 5.4* 4.6 4.1 4.1 4.3  CL 111 113* 104  --   --   --  100*  CO2 21* 26 30 29 28 26 27   GLUCOSE 121* 111* 123* 122 118 128 98  BUN 69* 28* 32* 20.5 16.3 25.2 32*  CALCIUM 8.6* 8.1* 9.4 9.4 9.5 9.1 9.0  CREATININE 8.26* 2.57* 1.35* 1.5* 1.3 1.4* 1.38*  GFRNONAA 5* 22* 47*  --   --   --  46*  GFRAA 6* 25* 55*  --   --   --  53*    LIVER FUNCTION TESTS:  Recent Labs  10/22/16 1344 12/07/16 1504 12/27/16 0853 01/04/17 1005  BILITOT 0.8 0.45 0.47 0.86  AST 17 21 24 21   ALT 17 25 40 29  ALKPHOS 89 99 98 85  PROT 7.7 6.9 6.9 6.9  ALBUMIN 3.8 2.9* 2.9* 2.9*    Assessment and Plan: Pt with history of high-grade bladder carcinoma diagnosed initially in July of this year. He is currently  undergoing chemoradiation. He had bilateral percutaneous nephrostomy tubes placed by our service on 10/23/16, exchanged 12/01/16 . He has poor venous access and presents today for Port-A-Cath placement.Risks and benefits discussed with the patient/spouse including, but not limited to bleeding, infection, pneumothorax, or fibrin sheath development and need for additional procedures. All of the patient's questions were answered, patient is agreeable to proceed.Consent signed and in chart.     Electronically Signed: D. Rowe Robert, PA-C 01/07/2017, 1:39 PM   I spent a total of 25 minutes at the the patient's bedside AND on the patient's hospital floor or unit, greater than 50% of which was counseling/coordinating care for Port-A-Cath placement

## 2017-01-07 NOTE — Procedures (Signed)
Pre Procedure Dx: Poor venous access Post Procedural Dx: Same  Successful placement of right IJ approach port-a-cath with tip at the superior caval atrial junction. The catheter is ready for immediate use.  Estimated Blood Loss: Minimal  Complications: None immediate.  Jay Dadrian Ballantine, MD Pager #: [phone]   

## 2017-01-07 NOTE — Discharge Instructions (Signed)
Implanted Port Insertion, Care After °This sheet gives you information about how to care for yourself after your procedure. Your health care provider may also give you more specific instructions. If you have problems or questions, contact your health care provider. °What can I expect after the procedure? °After your procedure, it is common to have: °· Discomfort at the port insertion site. °· Bruising on the skin over the port. This should improve over 3-4 days. ° °Follow these instructions at home: °Port care °· After your port is placed, you will get a manufacturer's information card. The card has information about your port. Keep this card with you at all times. °· Take care of the port as told by your health care provider. Ask your health care provider if you or a family member can get training for taking care of the port at home. A home health care nurse may also take care of the port. °· Make sure to remember what type of port you have. °Incision care °· Follow instructions from your health care provider about how to take care of your port insertion site. Make sure you: °? Wash your hands with soap and water before you change your bandage (dressing). If soap and water are not available, use hand sanitizer. °? Change your dressing as told by your health care provider. °? Leave stitches (sutures), skin glue, or adhesive strips in place. These skin closures may need to stay in place for 2 weeks or longer. If adhesive strip edges start to loosen and curl up, you may trim the loose edges. Do not remove adhesive strips completely unless your health care provider tells you to do that. °· Check your port insertion site every day for signs of infection. Check for: °? More redness, swelling, or pain. °? More fluid or blood. °? Warmth. °? Pus or a bad smell. °General instructions °· Do not take baths, swim, or use a hot tub until your health care provider approves. °· Do not lift anything that is heavier than 10 lb (4.5  kg) for a week, or as told by your health care provider. °· Ask your health care provider when it is okay to: °? Return to work or school. °? Resume usual physical activities or sports. °· Do not drive for 24 hours if you were given a medicine to help you relax (sedative). °· Take over-the-counter and prescription medicines only as told by your health care provider. °· Wear a medical alert bracelet in case of an emergency. This will tell any health care providers that you have a port. °· Keep all follow-up visits as told by your health care provider. This is important. °Contact a health care provider if: °· You cannot flush your port with saline as directed, or you cannot draw blood from the port. °· You have a fever or chills. °· You have more redness, swelling, or pain around your port insertion site. °· You have more fluid or blood coming from your port insertion site. °· Your port insertion site feels warm to the touch. °· You have pus or a bad smell coming from the port insertion site. °Get help right away if: °· You have chest pain or shortness of breath. °· You have bleeding from your port that you cannot control. °Summary °· Take care of the port as told by your health care provider. °· Change your dressing as told by your health care provider. °· Keep all follow-up visits as told by your health care provider. °  This information is not intended to replace advice given to you by your health care provider. Make sure you discuss any questions you have with your health care provider. °Document Released: 01/10/2013 Document Revised: 02/11/2016 Document Reviewed: 02/11/2016 °Elsevier Interactive Patient Education © 2017 [employer] °Moderate Conscious Sedation, Adult, Care After °These instructions provide you with information about caring for yourself after your procedure. Your health care provider may also give you more specific instructions. Your treatment has been planned according to current medical  practices, but problems sometimes occur. Call your health care provider if you have any problems or questions after your procedure. °What can I expect after the procedure? °After your procedure, it is common: °· To feel sleepy for several hours. °· To feel clumsy and have poor balance for several hours. °· To have poor judgment for several hours. °· To vomit if you eat too soon. ° °Follow these instructions at home: °For at least 24 hours after the procedure: ° °· Do not: °? Participate in activities where you could fall or become injured. °? Drive. °? Use heavy machinery. °? Drink alcohol. °? Take sleeping pills or medicines that cause drowsiness. °? Make important decisions or sign legal documents. °? Take care of children on your own. °· Rest. °Eating and drinking °· Follow the diet recommended by your health care provider. °· If you vomit: °? Drink water, juice, or soup when you can drink without vomiting. °? Make sure you have little or no nausea before eating solid foods. °General instructions °· Have a responsible adult stay with you until you are awake and alert. °· Take over-the-counter and prescription medicines only as told by your health care provider. °· If you smoke, do not smoke without supervision. °· Keep all follow-up visits as told by your health care provider. This is important. °Contact a health care provider if: °· You keep feeling nauseous or you keep vomiting. °· You feel light-headed. °· You develop a rash. °· You have a fever. °Get help right away if: °· You have trouble breathing. °This information is not intended to replace advice given to you by your health care provider. Make sure you discuss any questions you have with your health care provider. °Document Released: 01/10/2013 Document Revised: 08/25/2015 Document Reviewed: 07/12/2015 °Elsevier Interactive Patient Education © 2018 [employer] ° °

## 2017-01-10 ENCOUNTER — Telehealth: Payer: Self-pay | Admitting: Oncology

## 2017-01-10 ENCOUNTER — Ambulatory Visit
Admission: RE | Admit: 2017-01-10 | Discharge: 2017-01-10 | Disposition: A | Discharge status: Home / Self Care | Payer: Medicare Other | Source: Ambulatory Visit | Attending: Radiation Oncology | Admitting: Radiation Oncology

## 2017-01-10 DIAGNOSIS — Z510 Encounter for antineoplastic radiation therapy: Secondary | ICD-10-CM | POA: Diagnosis not present

## 2017-01-10 NOTE — Telephone Encounter (Signed)
Adjusted lab/flush/inf on 10/9 to 10:15 am. Left message for patient. Not able to reach patient on mobile. Left message on home phone.

## 2017-01-11 ENCOUNTER — Other Ambulatory Visit (HOSPITAL_BASED_OUTPATIENT_CLINIC_OR_DEPARTMENT_OTHER): Payer: Medicare Other

## 2017-01-11 ENCOUNTER — Ambulatory Visit
Admission: RE | Admit: 2017-01-11 | Discharge: 2017-01-11 | Disposition: A | Discharge status: Home / Self Care | Payer: Medicare Other | Source: Ambulatory Visit | Attending: Radiation Oncology | Admitting: Radiation Oncology

## 2017-01-11 ENCOUNTER — Ambulatory Visit (HOSPITAL_BASED_OUTPATIENT_CLINIC_OR_DEPARTMENT_OTHER): Payer: Medicare Other

## 2017-01-11 ENCOUNTER — Other Ambulatory Visit: Payer: Self-pay | Admitting: *Deleted

## 2017-01-11 ENCOUNTER — Other Ambulatory Visit: Payer: Medicare Other

## 2017-01-11 VITALS — BP 108/64 | HR 72 | Temp 97.8°F | Resp 16 | Wt 141.4 lb

## 2017-01-11 DIAGNOSIS — Z5111 Encounter for antineoplastic chemotherapy: Secondary | ICD-10-CM | POA: Diagnosis present

## 2017-01-11 DIAGNOSIS — C679 Malignant neoplasm of bladder, unspecified: Secondary | ICD-10-CM

## 2017-01-11 DIAGNOSIS — Z510 Encounter for antineoplastic radiation therapy: Secondary | ICD-10-CM | POA: Diagnosis not present

## 2017-01-11 LAB — CBC WITH DIFFERENTIAL/PLATELET
BASO%: 0.7 % (ref 0.0–2.0)
Basophils Absolute: 0 10*3/uL (ref 0.0–0.1)
EOS%: 4.8 % (ref 0.0–7.0)
Eosinophils Absolute: 0.2 10*3/uL (ref 0.0–0.5)
HEMATOCRIT: 34 % — AB (ref 38.4–49.9)
HEMOGLOBIN: 11.3 g/dL — AB (ref 13.0–17.1)
LYMPH#: 0.4 10*3/uL — AB (ref 0.9–3.3)
LYMPH%: 9 % — ABNORMAL LOW (ref 14.0–49.0)
MCH: 30 pg (ref 27.2–33.4)
MCHC: 33.2 g/dL (ref 32.0–36.0)
MCV: 90.4 fL (ref 79.3–98.0)
MONO#: 0.5 10*3/uL (ref 0.1–0.9)
MONO%: 10.2 % (ref 0.0–14.0)
NEUT#: 3.4 10*3/uL (ref 1.5–6.5)
NEUT%: 75.3 % — AB (ref 39.0–75.0)
PLATELETS: 180 10*3/uL (ref 140–400)
RBC: 3.76 10*6/uL — ABNORMAL LOW (ref 4.20–5.82)
RDW: 14.3 % (ref 11.0–14.6)
WBC: 4.5 10*3/uL (ref 4.0–10.3)

## 2017-01-11 LAB — COMPREHENSIVE METABOLIC PANEL
ALBUMIN: 3 g/dL — AB (ref 3.5–5.0)
ALK PHOS: 86 U/L (ref 40–150)
ALT: 24 U/L (ref 0–55)
ANION GAP: 7 meq/L (ref 3–11)
AST: 21 U/L (ref 5–34)
BILIRUBIN TOTAL: 0.58 mg/dL (ref 0.20–1.20)
BUN: 17.1 mg/dL (ref 7.0–26.0)
CALCIUM: 9 mg/dL (ref 8.4–10.4)
CO2: 28 mEq/L (ref 22–29)
CREATININE: 1.2 mg/dL (ref 0.7–1.3)
Chloride: 104 mEq/L (ref 98–109)
EGFR: 54 mL/min/{1.73_m2} — ABNORMAL LOW (ref >90)
Glucose: 105 mg/dl (ref 70–140)
Potassium: 4.3 mEq/L (ref 3.5–5.1)
Sodium: 139 mEq/L (ref 136–145)
TOTAL PROTEIN: 6.7 g/dL (ref 6.4–8.3)

## 2017-01-11 MED ORDER — DEXAMETHASONE SODIUM PHOSPHATE 10 MG/ML IJ SOLN
INTRAMUSCULAR | Status: AC
  Filled 2017-01-11: qty 1

## 2017-01-11 MED ORDER — HEPARIN SOD (PORK) LOCK FLUSH 100 UNIT/ML IV SOLN
500.0000 [IU] | Freq: Once | INTRAVENOUS | Status: AC | PRN
  Administered 2017-01-11: 500 [IU]
  Filled 2017-01-11: qty 5

## 2017-01-11 MED ORDER — DEXAMETHASONE SODIUM PHOSPHATE 10 MG/ML IJ SOLN
10.0000 mg | Freq: Once | INTRAMUSCULAR | Status: AC
  Administered 2017-01-11: 10 mg via INTRAVENOUS

## 2017-01-11 MED ORDER — SODIUM CHLORIDE 0.9 % IV SOLN
Freq: Once | INTRAVENOUS | Status: AC
  Administered 2017-01-11: 11:00:00 via INTRAVENOUS

## 2017-01-11 MED ORDER — PALONOSETRON HCL INJECTION 0.25 MG/5ML
0.2500 mg | Freq: Once | INTRAVENOUS | Status: AC
  Administered 2017-01-11: 0.25 mg via INTRAVENOUS

## 2017-01-11 MED ORDER — ONDANSETRON HCL 4 MG PO TABS: 4.0000 mg | ORAL_TABLET | Freq: Three times a day (TID) | ORAL | 0 refills | Status: DC | PRN

## 2017-01-11 MED ORDER — SODIUM CHLORIDE 0.9% FLUSH
10.0000 mL | INTRAVENOUS | Status: DC | PRN
  Administered 2017-01-11: 10 mL
  Filled 2017-01-11: qty 10

## 2017-01-11 MED ORDER — OXYCODONE HCL 5 MG PO TABS: 5.0000 mg | ORAL_TABLET | ORAL | 0 refills | Status: AC | PRN

## 2017-01-11 MED ORDER — PALONOSETRON HCL INJECTION 0.25 MG/5ML
INTRAVENOUS | Status: AC
  Filled 2017-01-11: qty 5

## 2017-01-11 MED ORDER — SODIUM CHLORIDE 0.9 % IV SOLN
140.2000 mg | Freq: Once | INTRAVENOUS | Status: AC
  Administered 2017-01-11: 140 mg via INTRAVENOUS
  Filled 2017-01-11: qty 14

## 2017-01-11 NOTE — Patient Instructions (Signed)
[facility] Discharge Instructions for Patients Receiving Chemotherapy  Today you received the following chemotherapy agents carboplatin  To help prevent nausea and vomiting after your treatment, we encourage you to take your nausea medication as directed   If you develop nausea and vomiting that is not controlled by your nausea medication, call the clinic.   BELOW ARE SYMPTOMS THAT SHOULD BE REPORTED IMMEDIATELY:  *FEVER GREATER THAN 100.5 F  *CHILLS WITH OR WITHOUT FEVER  NAUSEA AND VOMITING THAT IS NOT CONTROLLED WITH YOUR NAUSEA MEDICATION  *UNUSUAL SHORTNESS OF BREATH  *UNUSUAL BRUISING OR BLEEDING  TENDERNESS IN MOUTH AND THROAT WITH OR WITHOUT PRESENCE OF ULCERS  *URINARY PROBLEMS  *BOWEL PROBLEMS  UNUSUAL RASH Items with * indicate a potential emergency and should be followed up as soon as possible.  Feel free to call the clinic you have any questions or concerns. The clinic phone number is [phone].  

## 2017-01-11 NOTE — Progress Notes (Signed)
Pt c/o persistent nausea with decreased appetite.  Pt weighed, down 5 lbs from 2 weeks ago.  Hypotensive (see flowsheets for values).  Reviewed with Dr. Alen Blew.  Ok to treat, will give additional IV fluids today and give pt zofran rx for home.  Reviewed with pt, pt verbalized understanding.

## 2017-01-12 ENCOUNTER — Ambulatory Visit
Admission: RE | Admit: 2017-01-12 | Discharge: 2017-01-12 | Disposition: A | Discharge status: Home / Self Care | Payer: Medicare Other | Source: Ambulatory Visit | Attending: Radiation Oncology | Admitting: Radiation Oncology

## 2017-01-12 DIAGNOSIS — Z510 Encounter for antineoplastic radiation therapy: Secondary | ICD-10-CM | POA: Diagnosis not present

## 2017-01-13 ENCOUNTER — Telehealth: Payer: Self-pay | Admitting: Oncology

## 2017-01-13 ENCOUNTER — Ambulatory Visit
Admission: RE | Admit: 2017-01-13 | Discharge: 2017-01-13 | Disposition: A | Discharge status: Home / Self Care | Payer: Medicare Other | Source: Ambulatory Visit | Attending: Radiation Oncology | Admitting: Radiation Oncology

## 2017-01-13 NOTE — Telephone Encounter (Signed)
01/12/17 prescription refill scanned in media to view °

## 2017-01-14 ENCOUNTER — Ambulatory Visit
Admission: RE | Admit: 2017-01-14 | Discharge: 2017-01-14 | Disposition: A | Discharge status: Home / Self Care | Payer: Medicare Other | Source: Ambulatory Visit | Attending: Radiation Oncology | Admitting: Radiation Oncology

## 2017-01-14 ENCOUNTER — Encounter: Payer: Self-pay | Admitting: Radiation Oncology

## 2017-01-16 ENCOUNTER — Encounter (HOSPITAL_COMMUNITY): Payer: Self-pay | Admitting: Emergency Medicine

## 2017-01-16 ENCOUNTER — Emergency Department (HOSPITAL_COMMUNITY): Payer: Medicare Other

## 2017-01-16 ENCOUNTER — Inpatient Hospital Stay (HOSPITAL_COMMUNITY)
Admission: EM | Admit: 2017-01-16 | Discharge: 2017-01-24 | DRG: 698 | Disposition: A | Discharge status: Hospice | Payer: Medicare Other | Attending: Internal Medicine | Admitting: Internal Medicine

## 2017-01-16 ENCOUNTER — Inpatient Hospital Stay (HOSPITAL_COMMUNITY): Payer: Medicare Other

## 2017-01-16 DIAGNOSIS — K520 Gastroenteritis and colitis due to radiation: Secondary | ICD-10-CM | POA: Diagnosis present

## 2017-01-16 DIAGNOSIS — Y732 Prosthetic and other implants, materials and accessory gastroenterology and urology devices associated with adverse incidents: Secondary | ICD-10-CM | POA: Diagnosis not present

## 2017-01-16 DIAGNOSIS — G92 Toxic encephalopathy: Secondary | ICD-10-CM | POA: Diagnosis present

## 2017-01-16 DIAGNOSIS — T859XXA Unspecified complication of internal prosthetic device, implant and graft, initial encounter: Secondary | ICD-10-CM | POA: Diagnosis not present

## 2017-01-16 DIAGNOSIS — D6481 Anemia due to antineoplastic chemotherapy: Secondary | ICD-10-CM | POA: Diagnosis present

## 2017-01-16 DIAGNOSIS — F419 Anxiety disorder, unspecified: Secondary | ICD-10-CM | POA: Diagnosis not present

## 2017-01-16 DIAGNOSIS — N390 Urinary tract infection, site not specified: Secondary | ICD-10-CM | POA: Diagnosis not present

## 2017-01-16 DIAGNOSIS — E785 Hyperlipidemia, unspecified: Secondary | ICD-10-CM | POA: Diagnosis present

## 2017-01-16 DIAGNOSIS — A419 Sepsis, unspecified organism: Secondary | ICD-10-CM | POA: Diagnosis present

## 2017-01-16 DIAGNOSIS — Z8249 Family history of ischemic heart disease and other diseases of the circulatory system: Secondary | ICD-10-CM

## 2017-01-16 DIAGNOSIS — Z880 Allergy status to penicillin: Secondary | ICD-10-CM | POA: Diagnosis not present

## 2017-01-16 DIAGNOSIS — Z23 Encounter for immunization: Secondary | ICD-10-CM

## 2017-01-16 DIAGNOSIS — D494 Neoplasm of unspecified behavior of bladder: Secondary | ICD-10-CM | POA: Diagnosis not present

## 2017-01-16 DIAGNOSIS — T50905A Adverse effect of unspecified drugs, medicaments and biological substances, initial encounter: Secondary | ICD-10-CM | POA: Diagnosis present

## 2017-01-16 DIAGNOSIS — Z515 Encounter for palliative care: Secondary | ICD-10-CM | POA: Diagnosis not present

## 2017-01-16 DIAGNOSIS — Z87442 Personal history of urinary calculi: Secondary | ICD-10-CM | POA: Diagnosis not present

## 2017-01-16 DIAGNOSIS — C679 Malignant neoplasm of bladder, unspecified: Secondary | ICD-10-CM | POA: Diagnosis present

## 2017-01-16 DIAGNOSIS — Z7189 Other specified counseling: Secondary | ICD-10-CM | POA: Diagnosis not present

## 2017-01-16 DIAGNOSIS — N136 Pyonephrosis: Secondary | ICD-10-CM | POA: Diagnosis present

## 2017-01-16 DIAGNOSIS — R1033 Periumbilical pain: Secondary | ICD-10-CM | POA: Diagnosis present

## 2017-01-16 DIAGNOSIS — A4150 Gram-negative sepsis, unspecified: Secondary | ICD-10-CM | POA: Diagnosis present

## 2017-01-16 DIAGNOSIS — Z66 Do not resuscitate: Secondary | ICD-10-CM | POA: Diagnosis present

## 2017-01-16 DIAGNOSIS — I959 Hypotension, unspecified: Secondary | ICD-10-CM | POA: Diagnosis present

## 2017-01-16 DIAGNOSIS — Z85828 Personal history of other malignant neoplasm of skin: Secondary | ICD-10-CM

## 2017-01-16 DIAGNOSIS — R5383 Other fatigue: Secondary | ICD-10-CM | POA: Diagnosis not present

## 2017-01-16 DIAGNOSIS — Z809 Family history of malignant neoplasm, unspecified: Secondary | ICD-10-CM

## 2017-01-16 DIAGNOSIS — Z87891 Personal history of nicotine dependence: Secondary | ICD-10-CM | POA: Diagnosis not present

## 2017-01-16 DIAGNOSIS — N179 Acute kidney failure, unspecified: Secondary | ICD-10-CM | POA: Diagnosis present

## 2017-01-16 DIAGNOSIS — N1330 Unspecified hydronephrosis: Secondary | ICD-10-CM | POA: Diagnosis present

## 2017-01-16 DIAGNOSIS — L8990 Pressure ulcer of unspecified site, unspecified stage: Secondary | ICD-10-CM | POA: Diagnosis present

## 2017-01-16 DIAGNOSIS — T83022A Displacement of nephrostomy catheter, initial encounter: Secondary | ICD-10-CM | POA: Diagnosis present

## 2017-01-16 DIAGNOSIS — I10 Essential (primary) hypertension: Secondary | ICD-10-CM | POA: Diagnosis present

## 2017-01-16 HISTORY — PX: IR NEPHROSTOMY EXCHANGE LEFT: IMG6069

## 2017-01-16 LAB — URINALYSIS, ROUTINE W REFLEX MICROSCOPIC
BILIRUBIN URINE: NEGATIVE
Glucose, UA: NEGATIVE mg/dL
KETONES UR: NEGATIVE mg/dL
Nitrite: NEGATIVE
PROTEIN: 100 mg/dL — AB
Specific Gravity, Urine: 1.017 (ref 1.005–1.030)
pH: 5 (ref 5.0–8.0)

## 2017-01-16 LAB — COMPREHENSIVE METABOLIC PANEL
ALBUMIN: 3 g/dL — AB (ref 3.5–5.0)
ALK PHOS: 73 U/L (ref 38–126)
ALT: 25 U/L (ref 17–63)
AST: 26 U/L (ref 15–41)
Anion gap: 13 (ref 5–15)
BILIRUBIN TOTAL: 1.1 mg/dL (ref 0.3–1.2)
BUN: 33 mg/dL — AB (ref 6–20)
CALCIUM: 8.1 mg/dL — AB (ref 8.9–10.3)
CO2: 21 mmol/L — ABNORMAL LOW (ref 22–32)
CREATININE: 3.18 mg/dL — AB (ref 0.61–1.24)
Chloride: 102 mmol/L (ref 101–111)
GFR calc Af Amer: 19 mL/min — ABNORMAL LOW (ref >60)
GFR, EST NON AFRICAN AMERICAN: 17 mL/min — AB (ref >60)
GLUCOSE: 161 mg/dL — AB (ref 65–99)
POTASSIUM: 3.7 mmol/L (ref 3.5–5.1)
Sodium: 136 mmol/L (ref 135–145)
TOTAL PROTEIN: 6.3 g/dL — AB (ref 6.5–8.1)

## 2017-01-16 LAB — CBC
HCT: 31.4 % — ABNORMAL LOW (ref 39.0–52.0)
HEMATOCRIT: 47.8 % (ref 39.0–52.0)
Hemoglobin: 16.4 g/dL (ref 13.0–17.0)
Hemoglobin: 10.3 g/dL — ABNORMAL LOW (ref 13.0–17.0)
MCH: 30.9 pg (ref 26.0–34.0)
MCH: 30 pg (ref 26.0–34.0)
MCHC: 34.3 g/dL (ref 30.0–36.0)
MCHC: 32.8 g/dL (ref 30.0–36.0)
MCV: 90 fL (ref 78.0–100.0)
MCV: 91.5 fL (ref 78.0–100.0)
PLATELETS: 83 10*3/uL — AB (ref 150–400)
Platelets: 111 10*3/uL — ABNORMAL LOW (ref 150–400)
RBC: 5.31 MIL/uL (ref 4.22–5.81)
RBC: 3.43 MIL/uL — ABNORMAL LOW (ref 4.22–5.81)
RDW: 15.2 % (ref 11.5–15.5)
RDW: 15.3 % (ref 11.5–15.5)
WBC: 8.4 10*3/uL (ref 4.0–10.5)
WBC: 9.2 10*3/uL (ref 4.0–10.5)

## 2017-01-16 LAB — CREATININE, SERUM
CREATININE: 2.95 mg/dL — AB (ref 0.61–1.24)
GFR calc Af Amer: 21 mL/min — ABNORMAL LOW (ref >60)
GFR, EST NON AFRICAN AMERICAN: 18 mL/min — AB (ref >60)

## 2017-01-16 LAB — PROCALCITONIN: PROCALCITONIN: 8.91 ng/mL

## 2017-01-16 LAB — MRSA PCR SCREENING: MRSA by PCR: NEGATIVE

## 2017-01-16 LAB — LACTIC ACID, PLASMA: Lactic Acid, Venous: 1 mmol/L (ref 0.5–1.9)

## 2017-01-16 LAB — APTT: APTT: 34 s (ref 24–36)

## 2017-01-16 LAB — PROTIME-INR
INR: 1.34
PROTHROMBIN TIME: 16.5 s — AB (ref 11.4–15.2)

## 2017-01-16 LAB — LIPASE, BLOOD: Lipase: 17 U/L (ref 11–51)

## 2017-01-16 LAB — I-STAT CG4 LACTIC ACID, ED: LACTIC ACID, VENOUS: 1.74 mmol/L (ref 0.5–1.9)

## 2017-01-16 MED ORDER — IOPAMIDOL (ISOVUE-300) INJECTION 61%
50.0000 mL | Freq: Once | INTRAVENOUS | Status: AC | PRN
  Administered 2017-01-16: 15 mL

## 2017-01-16 MED ORDER — SODIUM CHLORIDE 0.9 % IV BOLUS (SEPSIS): 1000.0000 mL | INTRAVENOUS | Status: DC | PRN

## 2017-01-16 MED ORDER — SODIUM CHLORIDE 0.9 % IV BOLUS (SEPSIS)
2000.0000 mL | Freq: Once | INTRAVENOUS | Status: AC
  Administered 2017-01-16: 2000 mL via INTRAVENOUS

## 2017-01-16 MED ORDER — ONDANSETRON HCL 4 MG/2ML IJ SOLN
4.0000 mg | Freq: Once | INTRAMUSCULAR | Status: AC | PRN
  Administered 2017-01-16: 4 mg via INTRAVENOUS
  Filled 2017-01-16: qty 2

## 2017-01-16 MED ORDER — LIDOCAINE HCL 1 % IJ SOLN
INTRAMUSCULAR | Status: AC | PRN
  Administered 2017-01-16: 10 mL

## 2017-01-16 MED ORDER — SODIUM CHLORIDE 0.9 % IV SOLN
500.0000 mg | Freq: Two times a day (BID) | INTRAVENOUS | Status: DC
  Administered 2017-01-17 – 2017-01-18 (×3): 500 mg via INTRAVENOUS
  Filled 2017-01-16 (×5): qty 0.5

## 2017-01-16 MED ORDER — PROMETHAZINE HCL 25 MG/ML IJ SOLN
12.5000 mg | Freq: Once | INTRAMUSCULAR | Status: AC
  Administered 2017-01-16: 12.5 mg via INTRAVENOUS
  Filled 2017-01-16: qty 1

## 2017-01-16 MED ORDER — LEVOFLOXACIN IN D5W 750 MG/150ML IV SOLN
750.0000 mg | Freq: Once | INTRAVENOUS | Status: DC
  Administered 2017-01-16: 750 mg via INTRAVENOUS
  Filled 2017-01-16 (×2): qty 150

## 2017-01-16 MED ORDER — LIDOCAINE HCL 1 % IJ SOLN
INTRAMUSCULAR | Status: AC
  Filled 2017-01-16: qty 20

## 2017-01-16 MED ORDER — ACETAMINOPHEN 325 MG PO TABS
650.0000 mg | ORAL_TABLET | Freq: Four times a day (QID) | ORAL | Status: DC | PRN
  Administered 2017-01-16 – 2017-01-22 (×2): 650 mg via ORAL
  Filled 2017-01-16 (×2): qty 2

## 2017-01-16 MED ORDER — FENTANYL CITRATE (PF) 100 MCG/2ML IJ SOLN
INTRAMUSCULAR | Status: AC
  Filled 2017-01-16: qty 4

## 2017-01-16 MED ORDER — SODIUM CHLORIDE 0.9 % IV BOLUS (SEPSIS)
500.0000 mL | Freq: Once | INTRAVENOUS | Status: AC
  Administered 2017-01-16: 500 mL via INTRAVENOUS

## 2017-01-16 MED ORDER — PHENYLEPHRINE HCL-NACL 10-0.9 MG/250ML-% IV SOLN
INTRAVENOUS | Status: AC
  Filled 2017-01-16: qty 250

## 2017-01-16 MED ORDER — LACTATED RINGERS IV SOLN
INTRAVENOUS | Status: DC
  Administered 2017-01-16: 17:00:00 via INTRAVENOUS

## 2017-01-16 MED ORDER — IOPAMIDOL (ISOVUE-300) INJECTION 61%: 30.0000 mL | Freq: Once | INTRAVENOUS | Status: DC | PRN

## 2017-01-16 MED ORDER — FENTANYL CITRATE (PF) 100 MCG/2ML IJ SOLN
INTRAMUSCULAR | Status: AC | PRN
  Administered 2017-01-16 (×2): 25 ug via INTRAVENOUS

## 2017-01-16 MED ORDER — VANCOMYCIN HCL IN DEXTROSE 1-5 GM/200ML-% IV SOLN
1000.0000 mg | Freq: Once | INTRAVENOUS | Status: AC
  Administered 2017-01-16: 1000 mg via INTRAVENOUS
  Filled 2017-01-16: qty 200

## 2017-01-16 MED ORDER — IOPAMIDOL (ISOVUE-300) INJECTION 61%
INTRAVENOUS | Status: AC
  Filled 2017-01-16: qty 30

## 2017-01-16 MED ORDER — METRONIDAZOLE IN NACL 5-0.79 MG/ML-% IV SOLN: 500.0000 mg | Freq: Three times a day (TID) | INTRAVENOUS | Status: DC

## 2017-01-16 MED ORDER — MIDAZOLAM HCL 2 MG/2ML IJ SOLN
INTRAMUSCULAR | Status: AC | PRN
  Administered 2017-01-16: 1 mg via INTRAVENOUS
  Administered 2017-01-16: 0.5 mg via INTRAVENOUS

## 2017-01-16 MED ORDER — MORPHINE SULFATE (PF) 4 MG/ML IV SOLN
4.0000 mg | Freq: Once | INTRAVENOUS | Status: AC
  Administered 2017-01-16: 4 mg via INTRAVENOUS
  Filled 2017-01-16: qty 1

## 2017-01-16 MED ORDER — PROCHLORPERAZINE MALEATE 10 MG PO TABS
10.0000 mg | ORAL_TABLET | Freq: Four times a day (QID) | ORAL | Status: DC | PRN
  Administered 2017-01-22 – 2017-01-24 (×4): 10 mg via ORAL
  Filled 2017-01-16 (×5): qty 1

## 2017-01-16 MED ORDER — SODIUM CHLORIDE 0.9 % IV BOLUS (SEPSIS)
1000.0000 mL | Freq: Once | INTRAVENOUS | Status: AC
  Administered 2017-01-16: 1000 mL via INTRAVENOUS

## 2017-01-16 MED ORDER — ONDANSETRON HCL 4 MG/2ML IJ SOLN
4.0000 mg | Freq: Four times a day (QID) | INTRAMUSCULAR | Status: DC | PRN
  Administered 2017-01-16 – 2017-01-23 (×6): 4 mg via INTRAVENOUS
  Filled 2017-01-16 (×6): qty 2

## 2017-01-16 MED ORDER — IOPAMIDOL (ISOVUE-300) INJECTION 61%
INTRAVENOUS | Status: AC
  Administered 2017-01-16: 15 mL
  Filled 2017-01-16: qty 50

## 2017-01-16 MED ORDER — HEPARIN SODIUM (PORCINE) 5000 UNIT/ML IJ SOLN
5000.0000 [IU] | Freq: Three times a day (TID) | INTRAMUSCULAR | Status: DC
  Administered 2017-01-16 – 2017-01-17 (×2): 5000 [IU] via SUBCUTANEOUS
  Filled 2017-01-16 (×2): qty 1

## 2017-01-16 MED ORDER — SODIUM CHLORIDE 0.9% FLUSH
10.0000 mL | Freq: Three times a day (TID) | INTRAVENOUS | Status: DC
  Administered 2017-01-17 – 2017-01-24 (×19): 10 mL via INTRAVENOUS

## 2017-01-16 MED ORDER — ALBUTEROL SULFATE (2.5 MG/3ML) 0.083% IN NEBU: 2.5000 mg | INHALATION_SOLUTION | RESPIRATORY_TRACT | Status: DC | PRN

## 2017-01-16 MED ORDER — PHENYLEPHRINE HCL-NACL 10-0.9 MG/250ML-% IV SOLN
0.0000 ug/min | INTRAVENOUS | Status: DC
  Administered 2017-01-16: 20 ug/min via INTRAVENOUS

## 2017-01-16 MED ORDER — MIDAZOLAM HCL 2 MG/2ML IJ SOLN
INTRAMUSCULAR | Status: AC
  Filled 2017-01-16: qty 6

## 2017-01-16 MED ORDER — ONDANSETRON HCL 4 MG/2ML IJ SOLN
4.0000 mg | Freq: Once | INTRAMUSCULAR | Status: AC
  Administered 2017-01-16: 4 mg via INTRAVENOUS
  Filled 2017-01-16: qty 2

## 2017-01-16 MED ORDER — FENTANYL CITRATE (PF) 100 MCG/2ML IJ SOLN
50.0000 ug | INTRAMUSCULAR | Status: DC | PRN
  Administered 2017-01-17 – 2017-01-22 (×4): 50 ug via INTRAVENOUS
  Filled 2017-01-16 (×4): qty 2

## 2017-01-16 MED ORDER — METRONIDAZOLE IN NACL 5-0.79 MG/ML-% IV SOLN
500.0000 mg | Freq: Once | INTRAVENOUS | Status: DC
  Filled 2017-01-16: qty 100

## 2017-01-16 NOTE — Consult Note (Signed)
Urology Consult   Physician requesting consult: Dr. Deno Etienne  Reason for consult: Sepsis and ureteral obstruction secondary to advanced bladder cancer  History of Present Illness: Randall Little is a 83 y.o. followed by Dr. [name] for a metastatic plasmacytoid bladder cancer.  He has been receiving palliative chemotherapy under the care of Dr. Alen Blew and has bilateral nephrostomy tubes for management of bilateral ureteral obstruction.  His left nephrostomy tube became dislodged over the last 24 hrs.  He has since developed fever and rigors and was noted to be mildy hypotensive.  He is admitted to the ICU for antibiotic therapy and has started on Levaquin and Vancomycin per [facility].     Past Medical History:  Diagnosis Date  . Arrhythmia    Tachycardia  . Bladder cancer ([place])   . Dyslipidemia   . Dysrhythmia   . History of kidney stones   . Skin cancer    removed from face  . SOB (shortness of breath)     Past Surgical History:  Procedure Laterality Date  . CYSTOSCOPY W/ RETROGRADES Bilateral 11/11/2016   Procedure: CYSTOSCOPY WITH TURBT;  Surgeon: Franchot Gallo, MD;  Location: [facility];  Service: Urology;  Laterality: Bilateral;  . gall stone removal    . IR FLUORO GUIDE PORT INSERTION RIGHT  01/07/2017  . IR NEPHROSTOMY EXCHANGE RIGHT  12/01/2016  . IR NEPHROSTOMY PLACEMENT LEFT  10/23/2016  . IR NEPHROSTOMY PLACEMENT LEFT  12/01/2016  . IR NEPHROSTOMY PLACEMENT RIGHT  10/23/2016  . IR US GUIDE VASC ACCESS RIGHT  01/07/2017    Current Hospital Medications:  Home Meds:  Current Meds  Medication Sig  . loperamide (IMODIUM) 2 MG capsule Take 2 mg by mouth as needed for diarrhea or loose stools.  . metoprolol succinate (TOPROL-XL) 50 MG 24 hr tablet TAKE 1 TABLET BY MOUTH ONCE DAILY .  TAKE  WITH  OR  IMMEDIATELY  FOLLOWING  A  MEAL  . ondansetron (ZOFRAN) 4 MG tablet Take 1 tablet (4 mg total) by mouth every 8 (eight) hours as needed for nausea or vomiting.  [name] oxyCODONE (OXY  IR/ROXICODONE) 5 MG immediate release tablet Take 1 tablet (5 mg total) by mouth every 4 (four) hours as needed for moderate pain.  [name] prochlorperazine (COMPAZINE) 10 MG tablet Take 1 tablet (10 mg total) by mouth every 6 (six) hours as needed for nausea or vomiting.  [name] zolpidem (AMBIEN) 5 MG tablet Take 5 mg by mouth at bedtime.     Scheduled Meds: . heparin  5,000 Units Subcutaneous Q8H  . iopamidol       Continuous Infusions: . lactated ringers 125 mL/hr at 01/16/17 1710  . meropenem (MERREM) IV    . sodium chloride    . vancomycin 1,000 mg (01/16/17 1710)   PRN Meds:.acetaminophen, albuterol, fentaNYL (SUBLIMAZE) injection, iopamidol, ondansetron (ZOFRAN) IV, prochlorperazine, sodium chloride  Allergies:  Allergies  Allergen Reactions  . Penicillin G Rash    Has patient had a PCN reaction causing immediate rash, facial/tongue/throat swelling, SOB or lightheadedness with hypotension: Yes Has patient had a PCN reaction causing severe rash involving mucus membranes or skin necrosis: No Has patient had a PCN reaction that required hospitalization: No Has patient had a PCN reaction occurring within the last 10 years: Yes If all of the above answers are "NO", then may proceed with Cephalosporin use.    Family History  Problem Relation Age of Onset  . Heart disease Mother   . Cancer Sister   .  Cancer Sister     Social History:  reports that he quit smoking about 50 years ago. His smoking use included Cigarettes. He has a 3.75 pack-year smoking history. He has never used smokeless tobacco. He reports that he does not drink alcohol or use drugs.  ROS: A complete review of systems was performed.  All systems are negative except for pertinent findings as noted.  Physical Exam:  Vital signs in last 24 hours: Temp:  [97.7 F (36.5 C)-101.2 F (38.4 C)] 101.2 F (38.4 C) (10/14 1447) Pulse Rate:  [95-108] 105 (10/14 1700) Resp:  [16-31] 31 (10/14 1700) BP: (87-122)/(53-72) 112/67  (10/14 1700) SpO2:  [88 %-100 %] 97 % (10/14 1700) Constitutional:  Alert and oriented, No acute distress Cardiovascular: tachycardic, No JVD Respiratory: Tachypneic GI: Abdomen is soft, nontender, nondistended, no abdominal masses GU: Bilateral nephrostomy tubes.  R PCN draining yellow urine.  L PCN with bloody drainage. Lymphatic: No lymphadenopathy Neurologic: Grossly intact, no focal deficits Psychiatric: Pt anxious and uncomfortable.  Laboratory Data:   Recent Labs  01/16/17 1219  WBC 8.4  HGB 16.4  HCT 47.8  PLT 111*     Recent Labs  01/16/17 1219  NA 136  K 3.7  CL 102  GLUCOSE 161*  BUN 33*  CALCIUM 8.1*  CREATININE 3.18*     Results for orders placed or performed during the hospital encounter of 01/16/17 (from the past 24 hour(s))  Lipase, blood     Status: None   Collection Time: 01/16/17 12:19 PM  Result Value Ref Range   Lipase 17 11 - 51 U/L  Comprehensive metabolic panel     Status: Abnormal   Collection Time: 01/16/17 12:19 PM  Result Value Ref Range   Sodium 136 135 - 145 mmol/L   Potassium 3.7 3.5 - 5.1 mmol/L   Chloride 102 101 - 111 mmol/L   CO2 21 (L) 22 - 32 mmol/L   Glucose, Bld 161 (H) 65 - 99 mg/dL   BUN 33 (H) 6 - 20 mg/dL   Creatinine, Ser 3.18 (H) 0.61 - 1.24 mg/dL   Calcium 8.1 (L) 8.9 - 10.3 mg/dL   Total Protein 6.3 (L) 6.5 - 8.1 g/dL   Albumin 3.0 (L) 3.5 - 5.0 g/dL   AST 26 15 - 41 U/L   ALT 25 17 - 63 U/L   Alkaline Phosphatase 73 38 - 126 U/L   Total Bilirubin 1.1 0.3 - 1.2 mg/dL   GFR calc non Af Amer 17 (L) >60 mL/min   GFR calc Af Amer 19 (L) >60 mL/min   Anion gap 13 5 - 15  CBC     Status: Abnormal   Collection Time: 01/16/17 12:19 PM  Result Value Ref Range   WBC 8.4 4.0 - 10.5 K/uL   RBC 5.31 4.22 - 5.81 MIL/uL   Hemoglobin 16.4 13.0 - 17.0 g/dL   HCT 47.8 39.0 - 52.0 %   MCV 90.0 78.0 - 100.0 fL   MCH 30.9 26.0 - 34.0 pg   MCHC 34.3 30.0 - 36.0 g/dL   RDW 15.2 11.5 - 15.5 %   Platelets 111 (L) 150 - 400  K/uL  I-Stat CG4 Lactic Acid, ED     Status: None   Collection Time: 01/16/17 12:31 PM  Result Value Ref Range   Lactic Acid, Venous 1.74 0.5 - 1.9 mmol/L   No results found for this or any previous visit (from the past 240 hour(s)).  Renal Function:  Recent Labs  01/11/17 0953 01/16/17 1219  CREATININE 1.2 3.18*   Estimated Creatinine Clearance: 16.2 mL/min (A) (by C-G formula based on SCr of 3.18 mg/dL (H)).  Radiologic Imaging: Ct Abdomen Pelvis Wo Contrast  Result Date: 01/16/2017 CLINICAL DATA:  Abdominal pain, weakness. EXAM: CT ABDOMEN AND PELVIS WITHOUT CONTRAST TECHNIQUE: Multidetector CT imaging of the abdomen and pelvis was performed following the standard protocol without IV contrast. COMPARISON:  PET scan of December 14, 2016. FINDINGS: Lower chest: No acute abnormality. Hepatobiliary: No focal liver abnormality is seen. Status post cholecystectomy. No biliary dilatation. Pancreas: Unremarkable. No pancreatic ductal dilatation or surrounding inflammatory changes. Spleen: Normal in size without focal abnormality. Adrenals/Urinary Tract: Adrenal glands appear normal. Right nephrostomy catheter is well positioned within the right renal pelvis. However, the left nephrostomy catheter appears to have been significantly withdrawn outside of the collecting system. Moderate left hydroureteronephrosis is noted. 3 mm distal right ureteral calculus is noted. Significant wall thickening of urinary bladder is noted consistent with history of bladder cancer. Stomach/Bowel: Stomach is within normal limits. Appendix appears normal. No evidence of bowel wall thickening, distention, or inflammatory changes. Vascular/Lymphatic: Aortic atherosclerosis. No enlarged abdominal or pelvic lymph nodes. Reproductive: Prostate is unremarkable. Other: No abdominal wall hernia or abnormality. No abdominopelvic ascites. Musculoskeletal: No acute or significant osseous findings. IMPRESSION: Left nephrostomy  catheter appears to have significantly withdrawn outside of the collecting system of left kidney. Moderate left hydroureteronephrosis is now noted. Stable wall thickening of urinary bladder is noted consistent with history of bladder carcinoma. Right nephrostomy catheter is well positioned within right renal collecting system without hydronephrosis. Aortic atherosclerosis. Electronically Signed   By: Marijo Conception, M.D.   On: 01/16/2017 15:30   Dg Chest 2 View  Result Date: 01/16/2017 CLINICAL DATA:  Weakness, abdominal pain. Patient is being treated for bladder carcinoma. EXAM: CHEST  2 VIEW COMPARISON:  PET-CT 12/14/2016 FINDINGS: Cardiomediastinal silhouette is normal. Mediastinal contours appear intact. Tortuosity and calcific atherosclerotic disease of the aorta. There is no evidence of focal airspace consolidation, pleural effusion or pneumothorax. Streaky opacities in bilateral lung bases, right greater than left, likely represent atelectasis versus scarring. Osseous structures are without acute abnormality. Soft tissues are grossly normal. IMPRESSION: Bibasilar atelectasis versus scarring. Calcific atherosclerotic disease of the aorta. Electronically Signed   By: Fidela Salisbury M.D.   On: 01/16/2017 13:17    I independently reviewed the above imaging studies.  Impression/Recommendation Sepsis with bilateral ureteral obstruction with dislodged left nephrostomy - Continue IV antibiotics, supportive care/sepsis protocol per primary team pending cultures - I spoke with Dr. Kathlene Cote who will plan to reposition or place new left PCN tonight  Jyssica Rief,LES 01/16/2017, 6:06 PM    Pryor Curia. MD   CC: Dr. Deno Etienne

## 2017-01-16 NOTE — Progress Notes (Signed)
Pharmacy Antibiotic Note  Randall Little is a 83 y.o. male admitted on 01/16/2017 with sepsis and dislodged urostomy tube.  PMH significant high grade urothelial carcinoma on weekly chemo.  Pharmacy has been consulted for vancomycin and meropenem dosing.  AKI on presentation.  Plan: Vancomycin 1g IV x 1.  F/u SCr and random vancomycin levels to determine when further doses needed. Meropenem 500mg  IV q12h. Daily SCr.     Temp (24hrs), Avg:99.5 F (37.5 C), Min:97.7 F (36.5 C), Max:101.2 F (38.4 C)   Recent Labs Lab 01/11/17 0953 01/16/17 1219 01/16/17 1231  WBC 4.5 8.4  --   CREATININE 1.2 3.18*  --   LATICACIDVEN  --   --  1.74    Estimated Creatinine Clearance: 16.2 mL/min (A) (by C-G formula based on SCr of 3.18 mg/dL (H)).    Allergies  Allergen Reactions  . Penicillin G Rash    Has patient had a PCN reaction causing immediate rash, facial/tongue/throat swelling, SOB or lightheadedness with hypotension: Yes Has patient had a PCN reaction causing severe rash involving mucus membranes or skin necrosis: No Has patient had a PCN reaction that required hospitalization: No Has patient had a PCN reaction occurring within the last 10 years: Yes If all of the above answers are "NO", then may proceed with Cephalosporin use.    Antimicrobials this admission: 10/14 Levaquin x 1 10/14 Flagyl  10/14 Vancomycin >> 10/14 Meropenem >>  Dose adjustments this admission:  Microbiology results: 10/14 BCx:   Thank you for allowing pharmacy to be a part of this patient's care.  Hershal Coria 01/16/2017 4:20 PM

## 2017-01-16 NOTE — ED Notes (Signed)
Patient went to xray 

## 2017-01-16 NOTE — Procedures (Signed)
Interventional Radiology Procedure Note  Procedure: Replacement of malpositioned left percutaneous nephrostomy tube  Complications: None  Estimated Blood Loss: 25 mL  Recommendations: Left PCN was coiled in renal parenchyma with some communication remaining to collecting system.   After removal, difficulty advancing catheter due to kinking of guidewire conforming to old catheter course. New 10 Fr PCN eventually able to be advanced into left renal pelvis. Initial output from tract was purulent, but now tube output grossly bloody from collecting system after wire and catheter manipulation. Will irrigate PCN and leave to gravity drainage.  Venetia Night. Kathlene Cote, M.D Pager:  [phone]

## 2017-01-16 NOTE — ED Provider Notes (Signed)
[facility] DEPT Provider Note   CSN: [id number] Arrival date & time: 01/16/17  1104     History   Chief Complaint Chief Complaint  Patient presents with  . Abdominal Pain    HPI Randall Little is a 83 y.o. male.  HPI Complains of lower abdominal pain onset  1 day ago. Pain is constant, nonradiating not made better or worse by anything. Other associated symptoms include gross blood in his left nephrostomy tube since yesterday. And diarrhea positive for episodes since yesterday. No recent antibiotics or travel. Other associated symptoms include generalized weakness.Nothing makes symptoms better or worse. No other associated symptoms. Last chemotherapy was 2 days ago. No treatment prior to coming here. No other associated symptoms Past Medical History:  Diagnosis Date  . Arrhythmia    Tachycardia  . Bladder cancer ([place])   . Dyslipidemia   . Dysrhythmia   . History of kidney stones   . Skin cancer    removed from face  . SOB (shortness of breath)     Patient Active Problem List   Diagnosis Date Noted  . Urothelial carcinoma of bladder ([place]) 12/08/2016  . Bladder tumor 11/11/2016  . AKI (acute kidney injury) ([place]) 10/22/2016  . Acute renal failure ([place])   . Hyperkalemia   . Hydronephrosis   . Essential hypertension 08/09/2016  . Hyperlipidemia 12/23/2010  . Tachycardia 12/23/2010    Past Surgical History:  Procedure Laterality Date  . CYSTOSCOPY W/ RETROGRADES Bilateral 11/11/2016   Procedure: CYSTOSCOPY WITH TURBT;  Surgeon: Franchot Gallo, MD;  Location: [facility] ORS;  Service: Urology;  Laterality: Bilateral;  . gall stone removal    . IR FLUORO GUIDE PORT INSERTION RIGHT  01/07/2017  . IR NEPHROSTOMY EXCHANGE RIGHT  12/01/2016  . IR NEPHROSTOMY PLACEMENT LEFT  10/23/2016  . IR NEPHROSTOMY PLACEMENT LEFT  12/01/2016  . IR NEPHROSTOMY PLACEMENT RIGHT  10/23/2016  . IR US GUIDE VASC ACCESS RIGHT  01/07/2017       Home Medications    Prior to Admission  medications   Medication Sig Start Date End Date Taking? Authorizing Provider  loperamide (IMODIUM) 2 MG capsule Take 2 mg by mouth as needed for diarrhea or loose stools.   Yes [provider]  metoprolol succinate (TOPROL-XL) 50 MG 24 hr tablet TAKE 1 TABLET BY MOUTH ONCE DAILY .  TAKE  WITH  OR  IMMEDIATELY  FOLLOWING  A  MEAL 11/09/16  Yes Nahser, Wonda Cheng, MD  ondansetron (ZOFRAN) 4 MG tablet Take 1 tablet (4 mg total) by mouth every 8 (eight) hours as needed for nausea or vomiting. 01/11/17  Yes Wyatt Portela, MD  oxyCODONE (OXY IR/ROXICODONE) 5 MG immediate release tablet Take 1 tablet (5 mg total) by mouth every 4 (four) hours as needed for moderate pain. 01/11/17  Yes Wyatt Portela, MD  prochlorperazine (COMPAZINE) 10 MG tablet Take 1 tablet (10 mg total) by mouth every 6 (six) hours as needed for nausea or vomiting. 12/21/16  Yes Wyatt Portela, MD  zolpidem (AMBIEN) 5 MG tablet Take 5 mg by mouth at bedtime.  12/04/16  Yes [provider]    Family History Family History  Problem Relation Age of Onset  . Heart disease Mother   . Cancer Sister   . Cancer Sister     Social History Social History  Substance Use Topics  . Smoking status: Former Smoker    Packs/day: 0.25    Years: 15.00    Types: Cigarettes  Quit date: 04/05/1966  . Smokeless tobacco: Never Used     Comment: 50 years ago  . Alcohol use No     Allergies   Penicillin g   Review of Systems Review of Systems  Gastrointestinal: Positive for abdominal pain and diarrhea.  Genitourinary: Positive for hematuria.       Bilateral nephrostomies in place  Allergic/Immunologic: Positive for immunocompromised state.       Cancer patient receiving chemotherapy  All other systems reviewed and are negative.    Physical Exam Updated Vital Signs BP 102/72 (BP Location: Left Arm)   Pulse (!) 108   Temp 97.7 F (36.5 C) (Oral)   Resp (!) 24   SpO2 100%   Physical Exam  Constitutional: He is  oriented to person, place, and time.  Ill-appearing  HENT:  Head: Normocephalic and atraumatic.  Eyes: Pupils are equal, round, and reactive to light. Conjunctivae are normal.  Neck: Neck supple. No tracheal deviation present. No thyromegaly present.  Cardiovascular: Normal rate and regular rhythm.   No murmur heard. Pulmonary/Chest: Effort normal and breath sounds normal.  Abdominal: Soft. Bowel sounds are normal. He exhibits no distension. There is tenderness. There is no guarding.  Tender at infraumbilical area  Genitourinary: Penis normal.  Genitourinary Comments: Bilateral nephrostomy tubes. No flank tenderness. Gross hematuria coming out of left nephrostomy tube  Musculoskeletal: Normal range of motion. He exhibits no edema or tenderness.  Neurological: He is alert and oriented to person, place, and time. Coordination normal.  Skin: Skin is warm and dry. No rash noted.  Psychiatric: He has a normal mood and affect.  Nursing note and vitals reviewed.    ED Treatments / Results  Labs (all labs ordered are listed, but only abnormal results are displayed) Labs Reviewed  COMPREHENSIVE METABOLIC PANEL - Abnormal; Notable for the following:       Result Value   CO2 21 (*)    Glucose, Bld 161 (*)    BUN 33 (*)    Creatinine, Ser 3.18 (*)    Calcium 8.1 (*)    Total Protein 6.3 (*)    Albumin 3.0 (*)    GFR calc non Af Amer 17 (*)    GFR calc Af Amer 19 (*)    All other components within normal limits  CBC - Abnormal; Notable for the following:    Platelets 111 (*)    All other components within normal limits  LIPASE, BLOOD  URINALYSIS, ROUTINE W REFLEX MICROSCOPIC  I-STAT CG4 LACTIC ACID, ED    EKG  EKG Interpretation None       Radiology Dg Chest 2 View  Result Date: 01/16/2017 CLINICAL DATA:  Weakness, abdominal pain. Patient is being treated for bladder carcinoma. EXAM: CHEST  2 VIEW COMPARISON:  PET-CT 12/14/2016 FINDINGS: Cardiomediastinal silhouette is  normal. Mediastinal contours appear intact. Tortuosity and calcific atherosclerotic disease of the aorta. There is no evidence of focal airspace consolidation, pleural effusion or pneumothorax. Streaky opacities in bilateral lung bases, right greater than left, likely represent atelectasis versus scarring. Osseous structures are without acute abnormality. Soft tissues are grossly normal. IMPRESSION: Bibasilar atelectasis versus scarring. Calcific atherosclerotic disease of the aorta. Electronically Signed   By: Fidela Salisbury M.D.   On: 01/16/2017 13:17   Results for orders placed or performed during the hospital encounter of 01/16/17  Lipase, blood  Result Value Ref Range   Lipase 17 11 - 51 U/L  Comprehensive metabolic panel  Result Value Ref Range  Sodium 136 135 - 145 mmol/L   Potassium 3.7 3.5 - 5.1 mmol/L   Chloride 102 101 - 111 mmol/L   CO2 21 (L) 22 - 32 mmol/L   Glucose, Bld 161 (H) 65 - 99 mg/dL   BUN 33 (H) 6 - 20 mg/dL   Creatinine, Ser 3.18 (H) 0.61 - 1.24 mg/dL   Calcium 8.1 (L) 8.9 - 10.3 mg/dL   Total Protein 6.3 (L) 6.5 - 8.1 g/dL   Albumin 3.0 (L) 3.5 - 5.0 g/dL   AST 26 15 - 41 U/L   ALT 25 17 - 63 U/L   Alkaline Phosphatase 73 38 - 126 U/L   Total Bilirubin 1.1 0.3 - 1.2 mg/dL   GFR calc non Af Amer 17 (L) >60 mL/min   GFR calc Af Amer 19 (L) >60 mL/min   Anion gap 13 5 - 15  CBC  Result Value Ref Range   WBC 8.4 4.0 - 10.5 K/uL   RBC 5.31 4.22 - 5.81 MIL/uL   Hemoglobin 16.4 13.0 - 17.0 g/dL   HCT 47.8 39.0 - 52.0 %   MCV 90.0 78.0 - 100.0 fL   MCH 30.9 26.0 - 34.0 pg   MCHC 34.3 30.0 - 36.0 g/dL   RDW 15.2 11.5 - 15.5 %   Platelets 111 (L) 150 - 400 K/uL  I-Stat CG4 Lactic Acid, ED  Result Value Ref Range   Lactic Acid, Venous 1.74 0.5 - 1.9 mmol/L   Ct Abdomen Pelvis Wo Contrast  Result Date: 01/16/2017 CLINICAL DATA:  Abdominal pain, weakness. EXAM: CT ABDOMEN AND PELVIS WITHOUT CONTRAST TECHNIQUE: Multidetector CT imaging of the abdomen and  pelvis was performed following the standard protocol without IV contrast. COMPARISON:  PET scan of December 14, 2016. FINDINGS: Lower chest: No acute abnormality. Hepatobiliary: No focal liver abnormality is seen. Status post cholecystectomy. No biliary dilatation. Pancreas: Unremarkable. No pancreatic ductal dilatation or surrounding inflammatory changes. Spleen: Normal in size without focal abnormality. Adrenals/Urinary Tract: Adrenal glands appear normal. Right nephrostomy catheter is well positioned within the right renal pelvis. However, the left nephrostomy catheter appears to have been significantly withdrawn outside of the collecting system. Moderate left hydroureteronephrosis is noted. 3 mm distal right ureteral calculus is noted. Significant wall thickening of urinary bladder is noted consistent with history of bladder cancer. Stomach/Bowel: Stomach is within normal limits. Appendix appears normal. No evidence of bowel wall thickening, distention, or inflammatory changes. Vascular/Lymphatic: Aortic atherosclerosis. No enlarged abdominal or pelvic lymph nodes. Reproductive: Prostate is unremarkable. Other: No abdominal wall hernia or abnormality. No abdominopelvic ascites. Musculoskeletal: No acute or significant osseous findings. IMPRESSION: Left nephrostomy catheter appears to have significantly withdrawn outside of the collecting system of left kidney. Moderate left hydroureteronephrosis is now noted. Stable wall thickening of urinary bladder is noted consistent with history of bladder carcinoma. Right nephrostomy catheter is well positioned within right renal collecting system without hydronephrosis. Aortic atherosclerosis. Electronically Signed   By: Marijo Conception, M.D.   On: 01/16/2017 15:30   Dg Chest 2 View  Result Date: 01/16/2017 CLINICAL DATA:  Weakness, abdominal pain. Patient is being treated for bladder carcinoma. EXAM: CHEST  2 VIEW COMPARISON:  PET-CT 12/14/2016 FINDINGS:  Cardiomediastinal silhouette is normal. Mediastinal contours appear intact. Tortuosity and calcific atherosclerotic disease of the aorta. There is no evidence of focal airspace consolidation, pleural effusion or pneumothorax. Streaky opacities in bilateral lung bases, right greater than left, likely represent atelectasis versus scarring. Osseous structures are without acute abnormality. Soft  tissues are grossly normal. IMPRESSION: Bibasilar atelectasis versus scarring. Calcific atherosclerotic disease of the aorta. Electronically Signed   By: Fidela Salisbury M.D.   On: 01/16/2017 13:17   Ir US Guide Vasc Access Right  Result Date: 01/07/2017 INDICATION: History of bladder cancer. In need of durable intravenous access for chemotherapy administration. EXAM: IMPLANTED PORT A CATH PLACEMENT WITH ULTRASOUND AND FLUOROSCOPIC GUIDANCE COMPARISON:  None. MEDICATIONS: Vancomycin 1 gm IV; The antibiotic was administered within an appropriate time interval prior to skin puncture. ANESTHESIA/SEDATION: Moderate (conscious) sedation was employed during this procedure. A total of Versed 2 mg and Fentanyl 100 mcg was administered intravenously. Moderate Sedation Time: 50 minutes. The patient's level of consciousness and vital signs were monitored continuously by radiology nursing throughout the procedure under my direct supervision. CONTRAST:  None FLUOROSCOPY TIME:  18 seconds (3 mGy) COMPLICATIONS: None immediate. PROCEDURE: The procedure, risks, benefits, and alternatives were explained to the patient. Questions regarding the procedure were encouraged and answered. The patient understands and consents to the procedure. The right neck and chest were prepped with chlorhexidine in a sterile fashion, and a sterile drape was applied covering the operative field. Maximum barrier sterile technique with sterile gowns and gloves were used for the procedure. A timeout was performed prior to the initiation of the procedure. Local  anesthesia was provided with 1% lidocaine with epinephrine. After creating a small venotomy incision, a micropuncture kit was utilized to access the internal jugular vein. Real-time ultrasound guidance was utilized for vascular access including the acquisition of a permanent ultrasound image documenting patency of the accessed vessel. The microwire was utilized to measure appropriate catheter length. A subcutaneous port pocket was then created along the upper chest wall utilizing a combination of sharp and blunt dissection. The pocket was irrigated with sterile saline. A single lumen ISP power injectable port was chosen for placement. The 8 Fr catheter was tunneled from the port pocket site to the venotomy incision. The port was placed in the pocket. The external catheter was trimmed to appropriate length. At the venotomy, an 8 Fr peel-away sheath was placed over a guidewire under fluoroscopic guidance. The catheter was then placed through the sheath and the sheath was removed. Final catheter positioning was confirmed and documented with a fluoroscopic spot radiograph. The port was accessed with a Huber needle, aspirated and flushed with heparinized saline. The venotomy site was closed with an interrupted 4-0 Vicryl suture. The port pocket incision was closed with interrupted 2-0 Vicryl suture and the skin was opposed with a running subcuticular 4-0 Vicryl suture. Dermabond and Steri-strips were applied to both incisions. Dressings were placed. The patient tolerated the procedure well without immediate post procedural complication. FINDINGS: After catheter placement, the tip lies within the superior cavoatrial junction. The catheter aspirates and flushes normally and is ready for immediate use. IMPRESSION: Successful placement of a right internal jugular approach power injectable Port-A-Cath. The catheter is ready for immediate use. Electronically Signed   By: Sandi Mariscal M.D.   On: 01/07/2017 17:36   Ir Fluoro  Guide Port Insertion Right  Result Date: 01/07/2017 INDICATION: History of bladder cancer. In need of durable intravenous access for chemotherapy administration. EXAM: IMPLANTED PORT A CATH PLACEMENT WITH ULTRASOUND AND FLUOROSCOPIC GUIDANCE COMPARISON:  None. MEDICATIONS: Vancomycin 1 gm IV; The antibiotic was administered within an appropriate time interval prior to skin puncture. ANESTHESIA/SEDATION: Moderate (conscious) sedation was employed during this procedure. A total of Versed 2 mg and Fentanyl 100 mcg was administered intravenously. Moderate  Sedation Time: 50 minutes. The patient's level of consciousness and vital signs were monitored continuously by radiology nursing throughout the procedure under my direct supervision. CONTRAST:  None FLUOROSCOPY TIME:  18 seconds (3 mGy) COMPLICATIONS: None immediate. PROCEDURE: The procedure, risks, benefits, and alternatives were explained to the patient. Questions regarding the procedure were encouraged and answered. The patient understands and consents to the procedure. The right neck and chest were prepped with chlorhexidine in a sterile fashion, and a sterile drape was applied covering the operative field. Maximum barrier sterile technique with sterile gowns and gloves were used for the procedure. A timeout was performed prior to the initiation of the procedure. Local anesthesia was provided with 1% lidocaine with epinephrine. After creating a small venotomy incision, a micropuncture kit was utilized to access the internal jugular vein. Real-time ultrasound guidance was utilized for vascular access including the acquisition of a permanent ultrasound image documenting patency of the accessed vessel. The microwire was utilized to measure appropriate catheter length. A subcutaneous port pocket was then created along the upper chest wall utilizing a combination of sharp and blunt dissection. The pocket was irrigated with sterile saline. A single lumen ISP power  injectable port was chosen for placement. The 8 Fr catheter was tunneled from the port pocket site to the venotomy incision. The port was placed in the pocket. The external catheter was trimmed to appropriate length. At the venotomy, an 8 Fr peel-away sheath was placed over a guidewire under fluoroscopic guidance. The catheter was then placed through the sheath and the sheath was removed. Final catheter positioning was confirmed and documented with a fluoroscopic spot radiograph. The port was accessed with a Huber needle, aspirated and flushed with heparinized saline. The venotomy site was closed with an interrupted 4-0 Vicryl suture. The port pocket incision was closed with interrupted 2-0 Vicryl suture and the skin was opposed with a running subcuticular 4-0 Vicryl suture. Dermabond and Steri-strips were applied to both incisions. Dressings were placed. The patient tolerated the procedure well without immediate post procedural complication. FINDINGS: After catheter placement, the tip lies within the superior cavoatrial junction. The catheter aspirates and flushes normally and is ready for immediate use. IMPRESSION: Successful placement of a right internal jugular approach power injectable Port-A-Cath. The catheter is ready for immediate use. Electronically Signed   By: Simonne Come M.D.   On: 01/07/2017 17:36   Procedures Procedures (including critical care time)  Medications Ordered in ED Medications  iopamidol (ISOVUE-300) 61 % injection (not administered)  iopamidol (ISOVUE-300) 61 % injection 30 mL (not administered)  ondansetron (ZOFRAN) injection 4 mg (4 mg Intravenous Given 01/16/17 1238)  morphine 4 MG/ML injection 4 mg (4 mg Intravenous Given 01/16/17 1320)  sodium chloride 0.9 % bolus 2,000 mL (2,000 mLs Intravenous New Bag/Given 01/16/17 1320)    2:10 PM pain improved after treatment with intravenous morphine and intravenous fluids. Complains of nausea. IV Zofran ordered Initial Impression  / Assessment and Plan / ED Course  I have reviewed the triage vital signs and the nursing notes.  Pertinent labs & imaging results that were available during my care of the patient were reviewed by me and considered in my medical decision making (see chart for details).   4:30 PM patient feels improved after treatment with intravenous fluids and intravenous antibiotics He presently is experiencing chills. Sepsis - Repeat Assessment  Performed at:    430 pm  Vitals     Blood pressure (!) 102/58, pulse (!) 107, temperature (!)  101.2 F (38.4 C), temperature source Rectal, resp. rate 16, SpO2 96 %.  Heart:     Tachycardic  Lungs:    CTA  Capillary Refill:   <2 sec  Peripheral Pulse:   Radial pulse palpable  Skin:     Normal Color    I've consulted Dr.Yamagata from interventional radiology who will see patient in consultation however he is requesting that hospitalists and urology see patient and patient more stable prior to his performing intervention. Source of infection is likely nephrostomy I consulted urology Dr. Jess Barters however after multiple calls he has not responded. I also consulted Dr. Candiss Norse, hospitalist who will arrange for admission to stepdown unit. Dr. Candiss Norse is aware of urologist not responding and will make further attempts to contact urologist on-call Final Clinical Impressions(s) / ED Diagnoses  Diagnosis #1 sepsis #2 acute kidney injury #3 nephrostomy tube complication Final diagnoses:  None   CRITICAL CARE Performed by: Orlie Dakin Total critical care time: 45 minutes Critical care time was exclusive of separately billable procedures and treating other patients. Critical care was necessary to treat or prevent imminent or life-threatening deterioration. Critical care was time spent personally by me on the following activities: development of treatment plan with patient and/or surrogate as well as nursing, discussions with consultants, evaluation of patient's  response to treatment, examination of patient, obtaining history from patient or surrogate, ordering and performing treatments and interventions, ordering and review of laboratory studies, ordering and review of radiographic studies, pulse oximetry and re-evaluation of patient's condition. New Prescriptions New Prescriptions   No medications on file     Orlie Dakin, MD 01/16/17 [phone]

## 2017-01-16 NOTE — ED Triage Notes (Signed)
Patient BIB step son, reports patient is being treated for bladder cancer and had chemo on Friday. Since that time, patient c/o weakness, abdominal pain, and blood in the nephrostomy tube.

## 2017-01-16 NOTE — Progress Notes (Signed)
A consult was received from an ED physician for Levaquin and Flagyl per pharmacy dosing.  The patient's profile has been reviewed for ht/wt/allergies/indication/available labs.   A one time order has been placed for Levaquin 750 mg and Flagyl 500 mg.  Further antibiotics/pharmacy consults should be ordered by admitting physician if indicated.                       Thank you, Hershal Coria 01/16/2017  3:41 PM

## 2017-01-16 NOTE — H&P (Addendum)
TRH H&P   Patient Demographics:    Randall Little, is a 83 y.o. male  MRN: [id number]   DOB - [birth year]  Admit Date - 01/16/2017  Outpatient Primary MD for the patient is Leonard Downing, MD  Outpatient Specialists: Dr Alen Blew  Patient coming from: Home  Chief Complaint  Patient presents with  . Abdominal Pain      HPI:    Randall Little  is a 83 y.o. 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 Alen Blew status post 3 chemotherapy cycles and 15 radiation cycles all palliative, essential hypertension, dyslipidemia who lives at home, comes in with a few day history of lower abdominal discomfort and mild diarrhea, today he noted that his left nephrostomy tube had bloody drainage, came to the ER where workup showed sepsis, fever, ARF CT evidence of left-sided hydronephrosis and partially dislodged left nephrostomy tube.  ER physician called urology consult and requested me to admit the patient, patient currently appears weak and fatigued, he denies any headache or chest pain, no shortness of breath or cough, mild abdominal pain and watery stools for the last 2 days, no focal weakness or skin rashes.    Review of systems:    In addition to the HPI above,   No Fever-chills prior to coming to the hospital, No Headache, No changes with Vision or hearing, No problems swallowing food or Liquids, No Chest pain, Cough or Shortness of Breath, As above Abdominal pain, No Nausea or Vommitting, +ve diarrhea, No Blood in stool , No dysuria, No new skin rashes or  bruises, No new joints pains-aches,  No new weakness, tingling, numbness in any extremity, No recent weight gain or loss, No polyuria, polydypsia or polyphagia, No significant Mental Stressors.  A full 10 point Review of Systems was done, except as stated above, all other Review of Systems were negative.   With Past History of the following :    Past Medical History:  Diagnosis Date  . Arrhythmia    Tachycardia  . Bladder cancer ([place])   . Dyslipidemia   . Dysrhythmia   . History of kidney stones   . Skin cancer    removed from face  . SOB (shortness of breath)  Past Surgical History:  Procedure Laterality Date  . CYSTOSCOPY W/ RETROGRADES Bilateral 11/11/2016   Procedure: CYSTOSCOPY WITH TURBT;  Surgeon: Franchot Gallo, MD;  Location: [facility];  Service: Urology;  Laterality: Bilateral;  . gall stone removal    . IR FLUORO GUIDE PORT INSERTION RIGHT  01/07/2017  . IR NEPHROSTOMY EXCHANGE RIGHT  12/01/2016  . IR NEPHROSTOMY PLACEMENT LEFT  10/23/2016  . IR NEPHROSTOMY PLACEMENT LEFT  12/01/2016  . IR NEPHROSTOMY PLACEMENT RIGHT  10/23/2016  . IR US GUIDE VASC ACCESS RIGHT  01/07/2017      Social History:     Social History  Substance Use Topics  . Smoking status: Former Smoker    Packs/day: 0.25    Years: 15.00    Types: Cigarettes    Quit date: 04/05/1966  . Smokeless tobacco: Never Used     Comment: 50 years ago  . Alcohol use No       Family History :     Family History  Problem Relation Age of Onset  . Heart disease Mother   . Cancer Sister   . Cancer Sister        Home Medications:   Prior to Admission medications   Medication Sig Start Date End Date Taking? Authorizing Provider  loperamide (IMODIUM) 2 MG capsule Take 2 mg by mouth as needed for diarrhea or loose stools.   Yes [provider]  metoprolol succinate (TOPROL-XL) 50 MG 24 hr tablet TAKE 1 TABLET BY MOUTH ONCE DAILY .  TAKE  WITH  OR  IMMEDIATELY  FOLLOWING  A  MEAL 11/09/16   Yes Nahser, Wonda Cheng, MD  ondansetron (ZOFRAN) 4 MG tablet Take 1 tablet (4 mg total) by mouth every 8 (eight) hours as needed for nausea or vomiting. 01/11/17  Yes Wyatt Portela, MD  oxyCODONE (OXY IR/ROXICODONE) 5 MG immediate release tablet Take 1 tablet (5 mg total) by mouth every 4 (four) hours as needed for moderate pain. 01/11/17  Yes Wyatt Portela, MD  prochlorperazine (COMPAZINE) 10 MG tablet Take 1 tablet (10 mg total) by mouth every 6 (six) hours as needed for nausea or vomiting. 12/21/16  Yes Wyatt Portela, MD  zolpidem (AMBIEN) 5 MG tablet Take 5 mg by mouth at bedtime.  12/04/16  Yes [provider]     Allergies:     Allergies  Allergen Reactions  . Penicillin G Rash    Has patient had a PCN reaction causing immediate rash, facial/tongue/throat swelling, SOB or lightheadedness with hypotension: Yes Has patient had a PCN reaction causing severe rash involving mucus membranes or skin necrosis: No Has patient had a PCN reaction that required hospitalization: No Has patient had a PCN reaction occurring within the last 10 years: Yes If all of the above answers are "NO", then may proceed with Cephalosporin use.     Physical Exam:   Vitals  Blood pressure (!) 102/58, pulse (!) 107, temperature (!) 101.2 F (38.4 C), temperature source Rectal, resp. rate 16, SpO2 96 %.   1. General rail elderly white male who appears cachectic lying in hospital bed, looks fatigued  2. Normal affect and insight, Not Suicidal or Homicidal, Awake Alert, Oriented X 3.  3. No F.N deficits, ALL C.Nerves Intact, Strength 5/5 all 4 extremities, Sensation intact all 4 extremities, Plantars down going.  4. Ears and Eyes appear Normal, Conjunctivae clear, PERRLA. Moist Oral Mucosa.  5. Supple Neck, No JVD, No cervical lymphadenopathy appriciated, No Carotid Bruits.  6. Symmetrical Chest wall movement, Good air movement bilaterally, CTAB.  7. RRR, No Gallops, Rubs or Murmurs, No  Parasternal Heave.  8. Positive Bowel Sounds, Abdomen Soft, No tenderness, No organomegaly appriciated,No rebound -guarding or rigidity. Bilateral nephrostomy tube with gross blood in the left  9.  No Cyanosis, Normal Skin Turgor, No Skin Rash or Bruise.  10. Good muscle tone,  joints appear normal , no effusions, Normal ROM.  11. No Palpable Lymph Nodes in Neck or Axillae      Data Review:    CBC  Recent Labs Lab 01/11/17 0953 01/16/17 1219  WBC 4.5 8.4  HGB 11.3* 16.4  HCT 34.0* 47.8  PLT 180 111*  MCV 90.4 90.0  MCH 30.0 30.9  MCHC 33.2 34.3  RDW 14.3 15.2  LYMPHSABS 0.4*  --   MONOABS 0.5  --   EOSABS 0.2  --   BASOSABS 0.0  --    ------------------------------------------------------------------------------------------------------------------  Chemistries   Recent Labs Lab 01/11/17 0953 01/16/17 1219  NA 139 136  K 4.3 3.7  CL  --  102  CO2 28 21*  GLUCOSE 105 161*  BUN 17.1 33*  CREATININE 1.2 3.18*  CALCIUM 9.0 8.1*  AST 21 26  ALT 24 25  ALKPHOS 86 73  BILITOT 0.58 1.1   ------------------------------------------------------------------------------------------------------------------ estimated creatinine clearance is 16.2 mL/min (A) (by C-G formula based on SCr of 3.18 mg/dL (H)). ------------------------------------------------------------------------------------------------------------------ No results for input(s): TSH, T4TOTAL, T3FREE, THYROIDAB in the last 72 hours.  Invalid input(s): FREET3  Coagulation profile No results for input(s): INR, PROTIME in the last 168 hours. ------------------------------------------------------------------------------------------------------------------- No results for input(s): DDIMER in the last 72 hours. -------------------------------------------------------------------------------------------------------------------  Cardiac Enzymes No results for input(s): CKMB, TROPONINI, MYOGLOBIN in the last 168  hours.  Invalid input(s): CK ------------------------------------------------------------------------------------------------------------------ No results found for: BNP   ---------------------------------------------------------------------------------------------------------------  Urinalysis No results found for: COLORURINE, APPEARANCEUR, LABSPEC, PHURINE, GLUCOSEU, HGBUR, BILIRUBINUR, KETONESUR, PROTEINUR, UROBILINOGEN, NITRITE, LEUKOCYTESUR  ----------------------------------------------------------------------------------------------------------------   Imaging Results:    Ct Abdomen Pelvis Wo Contrast  Result Date: 01/16/2017 CLINICAL DATA:  Abdominal pain, weakness. EXAM: CT ABDOMEN AND PELVIS WITHOUT CONTRAST TECHNIQUE: Multidetector CT imaging of the abdomen and pelvis was performed following the standard protocol without IV contrast. COMPARISON:  PET scan of December 14, 2016. FINDINGS: Lower chest: No acute abnormality. Hepatobiliary: No focal liver abnormality is seen. Status post cholecystectomy. No biliary dilatation. Pancreas: Unremarkable. No pancreatic ductal dilatation or surrounding inflammatory changes. Spleen: Normal in size without focal abnormality. Adrenals/Urinary Tract: Adrenal glands appear normal. Right nephrostomy catheter is well positioned within the right renal pelvis. However, the left nephrostomy catheter appears to have been significantly withdrawn outside of the collecting system. Moderate left hydroureteronephrosis is noted. 3 mm distal right ureteral calculus is noted. Significant wall thickening of urinary bladder is noted consistent with history of bladder cancer. Stomach/Bowel: Stomach is within normal limits. Appendix appears normal. No evidence of bowel wall thickening, distention, or inflammatory changes. Vascular/Lymphatic: Aortic atherosclerosis. No enlarged abdominal or pelvic lymph nodes. Reproductive: Prostate is unremarkable. Other: No abdominal  wall hernia or abnormality. No abdominopelvic ascites. Musculoskeletal: No acute or significant osseous findings. IMPRESSION: Left nephrostomy catheter appears to have significantly withdrawn outside of the collecting system of left kidney. Moderate left hydroureteronephrosis is now noted. Stable wall thickening of urinary bladder is noted consistent with history of bladder carcinoma. Right nephrostomy catheter is well positioned within right renal collecting system without hydronephrosis. Aortic atherosclerosis. Electronically Signed   By: Marijo Conception, M.D.  On: 01/16/2017 15:30   Dg Chest 2 View  Result Date: 01/16/2017 CLINICAL DATA:  Weakness, abdominal pain. Patient is being treated for bladder carcinoma. EXAM: CHEST  2 VIEW COMPARISON:  PET-CT 12/14/2016 FINDINGS: Cardiomediastinal silhouette is normal. Mediastinal contours appear intact. Tortuosity and calcific atherosclerotic disease of the aorta. There is no evidence of focal airspace consolidation, pleural effusion or pneumothorax. Streaky opacities in bilateral lung bases, right greater than left, likely represent atelectasis versus scarring. Osseous structures are without acute abnormality. Soft tissues are grossly normal. IMPRESSION: Bibasilar atelectasis versus scarring. Calcific atherosclerotic disease of the aorta. Electronically Signed   By: Fidela Salisbury M.D.   On: 01/16/2017 13:17       Assessment & Plan:     1. Sepsis due to partially dislodged left nephrostomy tube with hydronephrosis and likely gram-negative infection. Will be admitted to stepdown, received IV fluid bolus in the ER, continue IV hydration and when necessary IV bolus, broad-spectrum IV antibiotics which will be vancomycin and meropenem, follow blood cultures and urine cultures, trend lactate and pro-calcitonin, if continues to be hypotensive may require pressors, does have right-sided subclavian Port-A-Cath however will get few peripheral IVs as well. IR  and neurology consults have been called.  2. Urothelial bladder cancer. Discussed his case with his oncologist Dr. Alen Blew, all treatments are palliative, supportive care for now.  3. ARF. Due to #1 & 5. IR planning to replace left nephrostomy tube tomorrow, for now hydrate and monitor.  4. Hypertension. Currently blood pressure low. Hold blood pressure medications.  5. Diarrhea. Likely due to radiation exposure. No use of antibiotics recently, we will try to rule out C. Difficile and provide supportive care for now.    DVT Prophylaxis Heparin   AM Labs Ordered, also please review Full Orders  Family Communication: Admission, patients condition and plan of care including tests being ordered have been discussed with the patient and daughter who indicate understanding and agree with the plan and Code Status.  Code Status DNR  Likely DC to  TBD  Condition GUARDED    Consults called: IR, Onc, Urology    Admission status: Inpt  Time spent in minutes : 35  Total Critical Care time in examining the patient bedside, evaluating Lab work and other data, over half of the total time was spent in coordinating patient care on the floor or bedisde in talking to patient/family members, communicating with nursing Staff on the floor and sub specialists [facility] oncology, Dr. Kathlene Cote IR to coordinate patients medical care and needs is  35 Minutes.   The condition which has caused critical injury/acute impairment of urology, cardiopulmonary vital organ system with a high probability of sudden clinically significant deterioration and can cause Potential Life threatening injury to this patient addressed today is  Sepsis, ARF due to partially dislodged left nephrostomy tube.    Lala Lund M.D on 01/16/2017 at 4:25 PM  Between 7am to 7pm - Pager - [phone] ( page via [facility], text pages only, please mention full 10 digit call back number).  After 7pm go to [email or web address] - password [facility]  [facility] - Office  [phone]

## 2017-01-16 NOTE — ED Notes (Signed)
EKG given to EDP,Yelverton,MD., for review. 

## 2017-01-17 ENCOUNTER — Ambulatory Visit: Payer: Medicare Other

## 2017-01-17 DIAGNOSIS — T859XXA Unspecified complication of internal prosthetic device, implant and graft, initial encounter: Secondary | ICD-10-CM

## 2017-01-17 DIAGNOSIS — R5383 Other fatigue: Secondary | ICD-10-CM

## 2017-01-17 DIAGNOSIS — L8990 Pressure ulcer of unspecified site, unspecified stage: Secondary | ICD-10-CM | POA: Insufficient documentation

## 2017-01-17 DIAGNOSIS — Z66 Do not resuscitate: Secondary | ICD-10-CM

## 2017-01-17 DIAGNOSIS — C679 Malignant neoplasm of bladder, unspecified: Secondary | ICD-10-CM

## 2017-01-17 LAB — LACTIC ACID, PLASMA: Lactic Acid, Venous: 1.2 mmol/L (ref 0.5–1.9)

## 2017-01-17 LAB — COMPREHENSIVE METABOLIC PANEL
ALBUMIN: 2.1 g/dL — AB (ref 3.5–5.0)
ALT: 23 U/L (ref 17–63)
ANION GAP: 6 (ref 5–15)
AST: 21 U/L (ref 15–41)
Alkaline Phosphatase: 60 U/L (ref 38–126)
BUN: 29 mg/dL — AB (ref 6–20)
CHLORIDE: 109 mmol/L (ref 101–111)
CO2: 21 mmol/L — AB (ref 22–32)
Calcium: 7.3 mg/dL — ABNORMAL LOW (ref 8.9–10.3)
Creatinine, Ser: 2.38 mg/dL — ABNORMAL HIGH (ref 0.61–1.24)
GFR calc Af Amer: 28 mL/min — ABNORMAL LOW (ref >60)
GFR calc non Af Amer: 24 mL/min — ABNORMAL LOW (ref >60)
GLUCOSE: 116 mg/dL — AB (ref 65–99)
POTASSIUM: 3.4 mmol/L — AB (ref 3.5–5.1)
SODIUM: 136 mmol/L (ref 135–145)
Total Bilirubin: 0.6 mg/dL (ref 0.3–1.2)
Total Protein: 4.7 g/dL — ABNORMAL LOW (ref 6.5–8.1)

## 2017-01-17 LAB — BLOOD CULTURE ID PANEL (REFLEXED)
Acinetobacter baumannii: NOT DETECTED
CANDIDA ALBICANS: NOT DETECTED
CANDIDA GLABRATA: NOT DETECTED
CANDIDA TROPICALIS: NOT DETECTED
Candida krusei: NOT DETECTED
Candida parapsilosis: NOT DETECTED
Carbapenem resistance: NOT DETECTED
ENTEROBACTER CLOACAE COMPLEX: NOT DETECTED
ENTEROCOCCUS SPECIES: NOT DETECTED
ESCHERICHIA COLI: NOT DETECTED
Enterobacteriaceae species: DETECTED — AB
HAEMOPHILUS INFLUENZAE: NOT DETECTED
Klebsiella oxytoca: NOT DETECTED
Klebsiella pneumoniae: DETECTED — AB
LISTERIA MONOCYTOGENES: NOT DETECTED
NEISSERIA MENINGITIDIS: NOT DETECTED
PROTEUS SPECIES: NOT DETECTED
Pseudomonas aeruginosa: NOT DETECTED
SERRATIA MARCESCENS: NOT DETECTED
STAPHYLOCOCCUS SPECIES: NOT DETECTED
STREPTOCOCCUS AGALACTIAE: NOT DETECTED
STREPTOCOCCUS PNEUMONIAE: NOT DETECTED
STREPTOCOCCUS SPECIES: NOT DETECTED
Staphylococcus aureus (BCID): NOT DETECTED
Streptococcus pyogenes: NOT DETECTED

## 2017-01-17 LAB — CBC
HCT: 25.5 % — ABNORMAL LOW (ref 39.0–52.0)
Hemoglobin: 8.7 g/dL — ABNORMAL LOW (ref 13.0–17.0)
MCH: 30.7 pg (ref 26.0–34.0)
MCHC: 34.1 g/dL (ref 30.0–36.0)
MCV: 90.1 fL (ref 78.0–100.0)
PLATELETS: 67 10*3/uL — AB (ref 150–400)
RBC: 2.83 MIL/uL — ABNORMAL LOW (ref 4.22–5.81)
RDW: 15.3 % (ref 11.5–15.5)
WBC: 7.1 10*3/uL (ref 4.0–10.5)

## 2017-01-17 LAB — RETICULOCYTES
RBC.: 2.94 MIL/uL — AB (ref 4.22–5.81)
RETIC COUNT ABSOLUTE: 32.3 10*3/uL (ref 19.0–186.0)
RETIC CT PCT: 1.1 % (ref 0.4–3.1)

## 2017-01-17 LAB — PROCALCITONIN: PROCALCITONIN: 10.47 ng/mL

## 2017-01-17 LAB — TYPE AND SCREEN
ABO/RH(D): O POS
Antibody Screen: NEGATIVE

## 2017-01-17 LAB — IRON AND TIBC
IRON: 7 ug/dL — AB (ref 45–182)
SATURATION RATIOS: 5 % — AB (ref 17.9–39.5)
TIBC: 136 ug/dL — AB (ref 250–450)
UIBC: 129 ug/dL

## 2017-01-17 LAB — ABO/RH: ABO/RH(D): O POS

## 2017-01-17 LAB — FOLATE: Folate: 22 ng/mL (ref >5.9)

## 2017-01-17 LAB — C DIFFICILE QUICK SCREEN W PCR REFLEX
C Diff antigen: NEGATIVE
C Diff interpretation: NOT DETECTED
C Diff toxin: NEGATIVE

## 2017-01-17 LAB — VITAMIN B12: VITAMIN B 12: 343 pg/mL (ref 180–914)

## 2017-01-17 LAB — CORTISOL: Cortisol, Plasma: 17.6 ug/dL

## 2017-01-17 LAB — FERRITIN: Ferritin: 631 ng/mL — ABNORMAL HIGH (ref 24–336)

## 2017-01-17 MED ORDER — LACTATED RINGERS IV SOLN
INTRAVENOUS | Status: AC
  Administered 2017-01-18: 12:00:00 via INTRAVENOUS
  Administered 2017-01-19: 75 mL via INTRAVENOUS

## 2017-01-17 MED ORDER — SODIUM CHLORIDE 0.9% FLUSH
10.0000 mL | INTRAVENOUS | Status: DC | PRN
  Administered 2017-01-21: 10 mL
  Filled 2017-01-17: qty 40

## 2017-01-17 MED ORDER — ADULT MULTIVITAMIN W/MINERALS CH
1.0000 | ORAL_TABLET | Freq: Every day | ORAL | Status: DC
  Administered 2017-01-18 – 2017-01-24 (×7): 1 via ORAL
  Filled 2017-01-17 (×7): qty 1

## 2017-01-17 MED ORDER — HYDROCORTISONE NA SUCCINATE PF 100 MG IJ SOLR
100.0000 mg | Freq: Three times a day (TID) | INTRAMUSCULAR | Status: DC
  Administered 2017-01-17 – 2017-01-21 (×12): 100 mg via INTRAVENOUS
  Filled 2017-01-17 (×12): qty 2

## 2017-01-17 MED ORDER — LACTATED RINGERS IV BOLUS (SEPSIS)
500.0000 mL | Freq: Once | INTRAVENOUS | Status: AC
  Administered 2017-01-17: 500 mL via INTRAVENOUS

## 2017-01-17 MED ORDER — BOOST / RESOURCE BREEZE PO LIQD
1.0000 | Freq: Three times a day (TID) | ORAL | Status: DC
  Administered 2017-01-17 – 2017-01-21 (×14): 1 via ORAL
  Administered 2017-01-22: 237 mL via ORAL
  Administered 2017-01-22 – 2017-01-24 (×4): 1 via ORAL
  Filled 2017-01-17: qty 1

## 2017-01-17 MED ORDER — CHLORHEXIDINE GLUCONATE CLOTH 2 % EX PADS
6.0000 | MEDICATED_PAD | Freq: Every day | CUTANEOUS | Status: DC
  Administered 2017-01-18 – 2017-01-22 (×6): 6 via TOPICAL

## 2017-01-17 MED ORDER — POTASSIUM CHLORIDE CRYS ER 20 MEQ PO TBCR
40.0000 meq | EXTENDED_RELEASE_TABLET | Freq: Four times a day (QID) | ORAL | Status: AC
  Administered 2017-01-17 (×2): 40 meq via ORAL
  Filled 2017-01-17 (×2): qty 2

## 2017-01-17 NOTE — Care Management Note (Signed)
Case Management Note  Patient Details  Name: EITAN DOUBLEDAY MRN: [id number] Date of Birth: 01-Jan-1933  Subjective/Objective:                  sepsis  Action/Plan: Date:  October 15,2 018 Chart reviewed for concurrent status and case management needs.  Will continue to follow patient progress.  Discharge Planning: following for needs  Expected discharge date: [id number]  Velva Harman, BSN, [place], [place]   Expected Discharge Date:                  Expected Discharge Plan:  Home/Self Care  In-House Referral:     Discharge planning Services  CM Consult  Post Acute Care Choice:    Choice offered to:     DME Arranged:    DME Agency:     HH Arranged:    HH Agency:     Status of Service:  In process, will continue to follow  If discussed at Long Length of Stay Meetings, dates discussed:    Additional Comments:  Leeroy Cha, RN 01/17/2017, 8:57 AM

## 2017-01-17 NOTE — Progress Notes (Addendum)
_0 @        PROGRESS NOTE                                                                                                                                                                                                             Patient Demographics:    Randall Little, is a 83 y.o. male, DOB - 29-Dec-1932, QAS:[id number]  Admit date - 01/16/2017   Admitting Physician Thurnell Lose, MD  Outpatient Primary MD for the patient is Leonard Downing, MD  LOS - 1  Chief Complaint  Patient presents with  . Abdominal Pain       Brief Narrative   Randall Little  is a 83 y.o. 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 Alen Blew status post 3 chemotherapy cycles and 15 radiation cycles all palliative, essential hypertension, dyslipidemia who lives at home, comes in with a few day history of lower abdominal discomfort and mild diarrhea, today he noted that his left nephrostomy tube had bloody drainage, came to the ER where workup showed sepsis, fever, ARF CT evidence of left-sided hydronephrosis and partially dislodged left nephrostomy tube.  ER physician called urology consult and requested me to admit the patient, patient currently appears weak and fatigued, he denies any headache or chest pain, no shortness of breath or cough, mild abdominal pain and watery stools for the last 2 days, no focal weakness or skin rashes.   Subjective:    Randall Little today has, No headache, No chest pain, No abdominal pain - No Nausea, No new weakness tingling or numbness, No Cough - SOB.    Assessment  & Plan :     1. Sepsis due to partially dislodged left nephrostomy tube with hydronephrosis and  gram-negative bacteremia and sepsis. Continue monitoring andstepdown, received IV bolus and continues to receive IV fluids for blood pressure support, also  on when necessary IV pressors for now, added stress dose steroids IV, pending random cortisol from this morning,currently on IV meropenem, today he appears less toxic and frail, continue to follow final blood cultures, trend lactate and pro-calcitonin, I had replaced left-sided nephrostomy tube on 01/16/2017, continue to monitor closely.  2. Urothelial bladder cancer. Discussed his case with his oncologist Dr. Alen Blew, all treatments are palliative, supportive care for now.  3. ARF. Due to #1 & 5. IR planning to replace left nephrostomy tube tomorrow, for now hydrate and monitor.  4.  Hypertension. Currently blood pressure low. Hold blood pressure medications.  5. Diarrhea. Likely due to radiation exposure. No use of antibiotics recently, we will try to rule out C. Difficile and provide supportive care for now.  6. Anemia and thrombocytopenia. Could be due to his chemotherapy sepsis and some element of dilution, he also had some blood loss in his nephrostomy tube yesterday on the left side,at this time heparin discontinued, no signs of ongoing bleeding, monitor CBC closely, type screen done.    Diet : Diet clear liquid Room service appropriate? Yes; Fluid consistency: Thin    Family Communication  : daughter  Code Status :  DNR  Disposition Plan  :  Step down  Consults  :  Urology, IR, Oncology  Procedures  :    CT -  IMPRESSION: Left nephrostomy catheter appears to have significantly withdrawn outside of the collecting system of left kidney. Moderate left hydroureteronephrosis is now noted. Stable wall thickening of urinary bladder is noted consistent with history of bladder carcinoma. Right nephrostomy catheter is well positioned within right renal collecting system without hydronephrosis. Aortic atherosclerosis.   Replacement of malpositioned left percutaneous nephrostomy tube on 01/16/2017 by IR physician Dr.Yamagata.    DVT Prophylaxis  :  SCDs  Lab Results  Component Value  Date   PLT 67 (L) 01/17/2017    Inpatient Medications  Scheduled Meds: . feeding supplement  1 Container Oral TID BM  . hydrocortisone sod succinate (SOLU-CORTEF) inj  100 mg Intravenous Q8H  . multivitamin with minerals  1 tablet Oral Daily  . potassium chloride  40 mEq Oral Q6H  . sodium chloride flush  10 mL Intravenous Q8H   Continuous Infusions: . lactated ringers    . lactated ringers    . meropenem (MERREM) IV Stopped (01/17/17 0512)  . phenylephrine (NEO-SYNEPHRINE) Adult infusion Stopped (01/17/17 0505)  . sodium chloride     PRN Meds:.acetaminophen, albuterol, fentaNYL (SUBLIMAZE) injection, iopamidol, ondansetron (ZOFRAN) IV, prochlorperazine, sodium chloride  Antibiotics  :    Anti-infectives    Start     Dose/Rate Route Frequency Ordered Stop   01/16/17 1800  meropenem (MERREM) 500 mg in sodium chloride 0.9 % 50 mL IVPB     500 mg 100 mL/hr over 30 Minutes Intravenous Every 12 hours 01/16/17 1630     01/16/17 1700  metroNIDAZOLE (FLAGYL) IVPB 500 mg  Status:  Discontinued     500 mg 100 mL/hr over 60 Minutes Intravenous Every 8 hours 01/16/17 1618 01/16/17 1631   01/16/17 1700  vancomycin (VANCOCIN) IVPB 1000 mg/200 mL premix     1,000 mg 200 mL/hr over 60 Minutes Intravenous  Once 01/16/17 1629 01/16/17 1810   01/16/17 1500  levofloxacin (LEVAQUIN) IVPB 750 mg  Status:  Discontinued     750 mg 100 mL/hr over 90 Minutes Intravenous  Once 01/16/17 1451 01/16/17 1618   01/16/17 1500  metroNIDAZOLE (FLAGYL) IVPB 500 mg  Status:  Discontinued     500 mg 100 mL/hr over 60 Minutes Intravenous  Once 01/16/17 1451 01/16/17 1618         Objective:   Vitals:   01/17/17 0900 01/17/17 0930 01/17/17 1000 01/17/17 1024  BP: (!) 89/51 (!) 87/49 (!) 83/47   Pulse: 87 87 86   Resp: _0 Temp:      TempSrc:      SpO2: 97% 97% 97%   Weight:      Height:    _1  (1.803 m)  Wt Readings from Last 3 Encounters:  01/16/17 64 kg (141 lb 1.5 oz)  01/11/17  64.1 kg (141 lb 6.4 oz)  01/07/17 64.1 kg (141 lb 4 oz)     Intake/Output Summary (Last 24 hours) at 01/17/17 1139 Last data filed at 01/17/17 0730  Gross per 24 hour  Intake            441.5 ml  Output              482 ml  Net            -40.5 ml     Physical Exam  Awake Alert, Oriented X 3, No new F.N deficits, Normal affect Elmer.AT,PERRAL Supple Neck,No JVD, No cervical lymphadenopathy appriciated.  Symmetrical Chest wall movement, Good air movement bilaterally, CTAB RRR,No Gallops,Rubs or new Murmurs, No Parasternal Heave +ve B.Sounds, Abd Soft, No tenderness, No organomegaly appriciated, No rebound - guarding or rigidity. Bilateral nephrostomy tubes in place No Cyanosis, Clubbing or edema, No new Rash or bruise     Data Review:    CBC  Recent Labs Lab 01/11/17 0953 01/16/17 1219 01/16/17 1748 01/17/17 0427  WBC 4.5 8.4 9.2 7.1  HGB 11.3* 16.4 10.3* 8.7*  HCT 34.0* 47.8 31.4* 25.5*  PLT 180 111* 83* 67*  MCV 90.4 90.0 91.5 90.1  MCH 30.0 30.9 30.0 30.7  MCHC 33.2 34.3 32.8 34.1  RDW 14.3 15.2 15.3 15.3  LYMPHSABS 0.4*  --   --   --   MONOABS 0.5  --   --   --   EOSABS 0.2  --   --   --   BASOSABS 0.0  --   --   --     Chemistries   Recent Labs Lab 01/11/17 0953 01/16/17 1219 01/16/17 1748 01/17/17 0427  NA 139 136  --  136  K 4.3 3.7  --  3.4*  CL  --  102  --  109  CO2 28 21*  --  21*  GLUCOSE 105 161*  --  116*  BUN 17.1 33*  --  29*  CREATININE 1.2 3.18* 2.95* 2.38*  CALCIUM 9.0 8.1*  --  7.3*  AST 21 26  --  21  ALT 24 25  --  23  ALKPHOS 86 73  --  60  BILITOT 0.58 1.1  --  0.6   ------------------------------------------------------------------------------------------------------------------ No results for input(s): CHOL, HDL, LDLCALC, TRIG, CHOLHDL, LDLDIRECT in the last 72 hours.  No results found for: HGBA1C ------------------------------------------------------------------------------------------------------------------ No  results for input(s): TSH, T4TOTAL, T3FREE, THYROIDAB in the last 72 hours.  Invalid input(s): FREET3 ------------------------------------------------------------------------------------------------------------------ No results for input(s): VITAMINB12, FOLATE, FERRITIN, TIBC, IRON, RETICCTPCT in the last 72 hours.  Coagulation profile  Recent Labs Lab 01/16/17 1748  INR 1.34    No results for input(s): DDIMER in the last 72 hours.  Cardiac Enzymes No results for input(s): CKMB, TROPONINI, MYOGLOBIN in the last 168 hours.  Invalid input(s): CK ------------------------------------------------------------------------------------------------------------------ No results found for: BNP  Micro Results Recent Results (from the past 240 hour(s))  Blood Culture (routine x 2)     Status: None (Preliminary result)   Collection Time: 01/16/17  2:52 PM  Result Value Ref Range Status   Specimen Description BLOOD LEFT ANTECUBITAL  Final   Special Requests   Final    BOTTLES DRAWN AEROBIC AND ANAEROBIC Blood Culture adequate volume   Culture  Setup Time   Final    GRAM NEGATIVE RODS IN BOTH AEROBIC AND  ANAEROBIC BOTTLES CRITICAL VALUE NOTED.  VALUE IS CONSISTENT WITH PREVIOUSLY REPORTED AND CALLED VALUE. Performed at [facility], [place] [address], [place], [place] [address]    Culture GRAM NEGATIVE RODS  Final   Report Status PENDING  Incomplete  Blood Culture (routine x 2)     Status: None (Preliminary result)   Collection Time: 01/16/17  3:20 PM  Result Value Ref Range Status   Specimen Description BLOOD PORTA CATH  Final   Special Requests   Final    BOTTLES DRAWN AEROBIC AND ANAEROBIC Blood Culture adequate volume   Culture  Setup Time   Final    IN BOTH AEROBIC AND ANAEROBIC BOTTLES GRAM NEGATIVE RODS CRITICAL RESULT CALLED TO, READ BACK BY AND VERIFIED WITH: Nani Skillern [id number] MLM Performed at [facility], [place] [address], [place], [place] [address]     Culture GRAM NEGATIVE RODS  Final   Report Status PENDING  Incomplete  Blood Culture ID Panel (Reflexed)     Status: Abnormal   Collection Time: 01/16/17  3:20 PM  Result Value Ref Range Status   Enterococcus species NOT DETECTED NOT DETECTED Final   Listeria monocytogenes NOT DETECTED NOT DETECTED Final   Staphylococcus species NOT DETECTED NOT DETECTED Final   Staphylococcus aureus NOT DETECTED NOT DETECTED Final   Streptococcus species NOT DETECTED NOT DETECTED Final   Streptococcus agalactiae NOT DETECTED NOT DETECTED Final   Streptococcus pneumoniae NOT DETECTED NOT DETECTED Final   Streptococcus pyogenes NOT DETECTED NOT DETECTED Final   Acinetobacter baumannii NOT DETECTED NOT DETECTED Final   Enterobacteriaceae species DETECTED (A) NOT DETECTED Final    Comment: Enterobacteriaceae represent a large family of gram-negative bacteria, not a single organism. CRITICAL RESULT CALLED TO, READ BACK BY AND VERIFIED WITH: PHARMD N Lincoln [id number] MLM    Enterobacter cloacae complex NOT DETECTED NOT DETECTED Final   Escherichia coli NOT DETECTED NOT DETECTED Final   Klebsiella oxytoca NOT DETECTED NOT DETECTED Final   Klebsiella pneumoniae DETECTED (A) NOT DETECTED Final    Comment: CRITICAL RESULT CALLED TO, READ BACK BY AND VERIFIED WITH: PHARMD N GLOGOVAC [id number] MLM    Proteus species NOT DETECTED NOT DETECTED Final   Serratia marcescens NOT DETECTED NOT DETECTED Final   Carbapenem resistance NOT DETECTED NOT DETECTED Final   Haemophilus influenzae NOT DETECTED NOT DETECTED Final   Neisseria meningitidis NOT DETECTED NOT DETECTED Final   Pseudomonas aeruginosa NOT DETECTED NOT DETECTED Final   Candida albicans NOT DETECTED NOT DETECTED Final   Candida glabrata NOT DETECTED NOT DETECTED Final   Candida krusei NOT DETECTED NOT DETECTED Final   Candida parapsilosis NOT DETECTED NOT DETECTED Final   Candida tropicalis NOT DETECTED NOT DETECTED Final    Comment: Performed at  [facility], [address] [place], [place] [address]  MRSA PCR Screening     Status: None   Collection Time: 01/16/17  6:00 PM  Result Value Ref Range Status   MRSA by PCR NEGATIVE NEGATIVE Final    Comment:        The GeneXpert MRSA Assay (FDA approved for NASAL specimens only), is one component of a comprehensive MRSA colonization surveillance program. It is not intended to diagnose MRSA infection nor to guide or monitor treatment for MRSA infections.   C difficile quick scan w PCR reflex     Status: None   Collection Time: 01/16/17 11:36 PM  Result Value Ref Range Status  C Diff antigen NEGATIVE NEGATIVE Final   C Diff toxin NEGATIVE NEGATIVE Final   C Diff interpretation No C. difficile detected.  Final    Radiology Reports Ct Abdomen Pelvis Wo Contrast  Result Date: 01/16/2017 CLINICAL DATA:  Abdominal pain, weakness. EXAM: CT ABDOMEN AND PELVIS WITHOUT CONTRAST TECHNIQUE: Multidetector CT imaging of the abdomen and pelvis was performed following the standard protocol without IV contrast. COMPARISON:  PET scan of December 14, 2016. FINDINGS: Lower chest: No acute abnormality. Hepatobiliary: No focal liver abnormality is seen. Status post cholecystectomy. No biliary dilatation. Pancreas: Unremarkable. No pancreatic ductal dilatation or surrounding inflammatory changes. Spleen: Normal in size without focal abnormality. Adrenals/Urinary Tract: Adrenal glands appear normal. Right nephrostomy catheter is well positioned within the right renal pelvis. However, the left nephrostomy catheter appears to have been significantly withdrawn outside of the collecting system. Moderate left hydroureteronephrosis is noted. 3 mm distal right ureteral calculus is noted. Significant wall thickening of urinary bladder is noted consistent with history of bladder cancer. Stomach/Bowel: Stomach is within normal limits. Appendix appears normal. No evidence of bowel wall thickening, distention, or  inflammatory changes. Vascular/Lymphatic: Aortic atherosclerosis. No enlarged abdominal or pelvic lymph nodes. Reproductive: Prostate is unremarkable. Other: No abdominal wall hernia or abnormality. No abdominopelvic ascites. Musculoskeletal: No acute or significant osseous findings. IMPRESSION: Left nephrostomy catheter appears to have significantly withdrawn outside of the collecting system of left kidney. Moderate left hydroureteronephrosis is now noted. Stable wall thickening of urinary bladder is noted consistent with history of bladder carcinoma. Right nephrostomy catheter is well positioned within right renal collecting system without hydronephrosis. Aortic atherosclerosis. Electronically Signed   By: Marijo Conception, M.D.   On: 01/16/2017 15:30   Dg Chest 2 View  Result Date: 01/16/2017 CLINICAL DATA:  Weakness, abdominal pain. Patient is being treated for bladder carcinoma. EXAM: CHEST  2 VIEW COMPARISON:  PET-CT 12/14/2016 FINDINGS: Cardiomediastinal silhouette is normal. Mediastinal contours appear intact. Tortuosity and calcific atherosclerotic disease of the aorta. There is no evidence of focal airspace consolidation, pleural effusion or pneumothorax. Streaky opacities in bilateral lung bases, right greater than left, likely represent atelectasis versus scarring. Osseous structures are without acute abnormality. Soft tissues are grossly normal. IMPRESSION: Bibasilar atelectasis versus scarring. Calcific atherosclerotic disease of the aorta. Electronically Signed   By: Fidela Salisbury M.D.   On: 01/16/2017 13:17   Ir US Guide Vasc Access Right  Result Date: 01/07/2017 INDICATION: History of bladder cancer. In need of durable intravenous access for chemotherapy administration. EXAM: IMPLANTED PORT A CATH PLACEMENT WITH ULTRASOUND AND FLUOROSCOPIC GUIDANCE COMPARISON:  None. MEDICATIONS: Vancomycin 1 gm IV; The antibiotic was administered within an appropriate time interval prior to skin  puncture. ANESTHESIA/SEDATION: Moderate (conscious) sedation was employed during this procedure. A total of Versed 2 mg and Fentanyl 100 mcg was administered intravenously. Moderate Sedation Time: 50 minutes. The patient's level of consciousness and vital signs were monitored continuously by radiology nursing throughout the procedure under my direct supervision. CONTRAST:  None FLUOROSCOPY TIME:  18 seconds (3 mGy) COMPLICATIONS: None immediate. PROCEDURE: The procedure, risks, benefits, and alternatives were explained to the patient. Questions regarding the procedure were encouraged and answered. The patient understands and consents to the procedure. The right neck and chest were prepped with chlorhexidine in a sterile fashion, and a sterile drape was applied covering the operative field. Maximum barrier sterile technique with sterile gowns and gloves were used for the procedure. A timeout was performed prior to the initiation of the procedure.  Local anesthesia was provided with 1% lidocaine with epinephrine. After creating a small venotomy incision, a micropuncture kit was utilized to access the internal jugular vein. Real-time ultrasound guidance was utilized for vascular access including the acquisition of a permanent ultrasound image documenting patency of the accessed vessel. The microwire was utilized to measure appropriate catheter length. A subcutaneous port pocket was then created along the upper chest wall utilizing a combination of sharp and blunt dissection. The pocket was irrigated with sterile saline. A single lumen ISP power injectable port was chosen for placement. The 8 Fr catheter was tunneled from the port pocket site to the venotomy incision. The port was placed in the pocket. The external catheter was trimmed to appropriate length. At the venotomy, an 8 Fr peel-away sheath was placed over a guidewire under fluoroscopic guidance. The catheter was then placed through the sheath and the sheath was  removed. Final catheter positioning was confirmed and documented with a fluoroscopic spot radiograph. The port was accessed with a Huber needle, aspirated and flushed with heparinized saline. The venotomy site was closed with an interrupted 4-0 Vicryl suture. The port pocket incision was closed with interrupted 2-0 Vicryl suture and the skin was opposed with a running subcuticular 4-0 Vicryl suture. Dermabond and Steri-strips were applied to both incisions. Dressings were placed. The patient tolerated the procedure well without immediate post procedural complication. FINDINGS: After catheter placement, the tip lies within the superior cavoatrial junction. The catheter aspirates and flushes normally and is ready for immediate use. IMPRESSION: Successful placement of a right internal jugular approach power injectable Port-A-Cath. The catheter is ready for immediate use. Electronically Signed   By: Sandi Mariscal M.D.   On: 01/07/2017 17:36   Ir Fluoro Guide Port Insertion Right  Result Date: 01/07/2017 INDICATION: History of bladder cancer. In need of durable intravenous access for chemotherapy administration. EXAM: IMPLANTED PORT A CATH PLACEMENT WITH ULTRASOUND AND FLUOROSCOPIC GUIDANCE COMPARISON:  None. MEDICATIONS: Vancomycin 1 gm IV; The antibiotic was administered within an appropriate time interval prior to skin puncture. ANESTHESIA/SEDATION: Moderate (conscious) sedation was employed during this procedure. A total of Versed 2 mg and Fentanyl 100 mcg was administered intravenously. Moderate Sedation Time: 50 minutes. The patient's level of consciousness and vital signs were monitored continuously by radiology nursing throughout the procedure under my direct supervision. CONTRAST:  None FLUOROSCOPY TIME:  18 seconds (3 mGy) COMPLICATIONS: None immediate. PROCEDURE: The procedure, risks, benefits, and alternatives were explained to the patient. Questions regarding the procedure were encouraged and answered. The  patient understands and consents to the procedure. The right neck and chest were prepped with chlorhexidine in a sterile fashion, and a sterile drape was applied covering the operative field. Maximum barrier sterile technique with sterile gowns and gloves were used for the procedure. A timeout was performed prior to the initiation of the procedure. Local anesthesia was provided with 1% lidocaine with epinephrine. After creating a small venotomy incision, a micropuncture kit was utilized to access the internal jugular vein. Real-time ultrasound guidance was utilized for vascular access including the acquisition of a permanent ultrasound image documenting patency of the accessed vessel. The microwire was utilized to measure appropriate catheter length. A subcutaneous port pocket was then created along the upper chest wall utilizing a combination of sharp and blunt dissection. The pocket was irrigated with sterile saline. A single lumen ISP power injectable port was chosen for placement. The 8 Fr catheter was tunneled from the port pocket site to the venotomy incision.  The port was placed in the pocket. The external catheter was trimmed to appropriate length. At the venotomy, an 8 Fr peel-away sheath was placed over a guidewire under fluoroscopic guidance. The catheter was then placed through the sheath and the sheath was removed. Final catheter positioning was confirmed and documented with a fluoroscopic spot radiograph. The port was accessed with a Huber needle, aspirated and flushed with heparinized saline. The venotomy site was closed with an interrupted 4-0 Vicryl suture. The port pocket incision was closed with interrupted 2-0 Vicryl suture and the skin was opposed with a running subcuticular 4-0 Vicryl suture. Dermabond and Steri-strips were applied to both incisions. Dressings were placed. The patient tolerated the procedure well without immediate post procedural complication. FINDINGS: After catheter  placement, the tip lies within the superior cavoatrial junction. The catheter aspirates and flushes normally and is ready for immediate use. IMPRESSION: Successful placement of a right internal jugular approach power injectable Port-A-Cath. The catheter is ready for immediate use. Electronically Signed   By: Sandi Mariscal M.D.   On: 01/07/2017 17:36    Time Spent in minutes  30   Lala Lund M.D on 01/17/2017 at 11:39 AM  Between 7am to 7pm - Pager - [phone] ( page via [email or web address], text pages only, please mention full 10 digit call back number). After 7pm go to [email or web address] - password [email or web address]

## 2017-01-17 NOTE — Progress Notes (Signed)
Spoke with Tammy, RN caring for patient today in ICU stepdown. She reports that Dr. Alen Blew made rounds earlier this morning and wants to hold patient's chemotherapy and radiation at this time. Informed therapist on L3 of this finding.

## 2017-01-17 NOTE — Progress Notes (Signed)
IP PROGRESS NOTE  Subjective:   Principle Diagnosis: 83 year old gentleman high-grade urothelial carcinoma, plasmacytoid variant diagnosed in July 2018. He presented with bilateral hydronephrosis and acute renal failure.   Prior Therapy: He is status post TURBT on 11/11/2016 confirmed the presence of muscle invasive disease. PET scan on 12/14/2016 showed no disease outside the bladder.  He has bilateral nephrostomy tube in place.  Current therapy: Radiation therapy with Carboplatin weekly. Therapy started 12/27/2016  Patient was hospitalized on 01/16/2017 with urosepsis and diarrhea. Over the last few weeks he has been complaining of weakness, fatigue and failure to thrive. He started to develop diarrhea last few days and presented to the emergency department after his nephrostomy tube was dislodged. He was also noted to be febrile and hypotensive in the emergency department.  He underwent replacement of a malpositioned left ephrostomy tube by Dr. Kathlene Cote.   Clinically, he appears to be weak and debilitated. He is comfortable this morning without distress.   Objective:  Vital signs in last 24 hours: Temp:  [97.7 F (36.5 C)-102.8 F (39.3 C)] 98.5 F (36.9 C) (10/15 0301) Pulse Rate:  [87-124] 102 (10/15 0500) Resp:  [14-31] 24 (10/15 0500) BP: (80-137)/(40-76) 108/54 (10/15 0400) SpO2:  [88 %-100 %] 95 % (10/15 0500) Weight:  [141 lb 1.5 oz (64 kg)] 141 lb 1.5 oz (64 kg) (10/14 2000) Weight change:  Last BM Date: 01/16/17  Intake/Output from previous day: 10/14 0701 - 10/15 0700 In: 200 [IV Piggyback:200] Out: 477 [Urine:477] Ill-appearing gentleman appeared comfortable. Mouth: mucous membranes moist, pharynx normal without lesions Resp: clear to auscultation bilaterally Cardio: regular rate and rhythm, S1, S2 normal, no murmur, click, rub or gallop GI: soft, non-tender; bowel sounds normal; no masses,  no organomegaly Extremities: extremities normal, atraumatic, no  cyanosis or edema  Portacath site without erythema or induration.  Lab Results:  Recent Labs  01/16/17 1748 01/17/17 0427  WBC 9.2 7.1  HGB 10.3* 8.7*  HCT 31.4* 25.5*  PLT 83* 67*    BMET  Recent Labs  01/16/17 1219 01/16/17 1748 01/17/17 0427  NA 136  --  136  K 3.7  --  3.4*  CL 102  --  109  CO2 21*  --  21*  GLUCOSE 161*  --  116*  BUN 33*  --  29*  CREATININE 3.18* 2.95* 2.38*  CALCIUM 8.1*  --  7.3*    Studies/Results: Ct Abdomen Pelvis Wo Contrast  Result Date: 01/16/2017 CLINICAL DATA:  Abdominal pain, weakness. EXAM: CT ABDOMEN AND PELVIS WITHOUT CONTRAST TECHNIQUE: Multidetector CT imaging of the abdomen and pelvis was performed following the standard protocol without IV contrast. COMPARISON:  PET scan of December 14, 2016. FINDINGS: Lower chest: No acute abnormality. Hepatobiliary: No focal liver abnormality is seen. Status post cholecystectomy. No biliary dilatation. Pancreas: Unremarkable. No pancreatic ductal dilatation or surrounding inflammatory changes. Spleen: Normal in size without focal abnormality. Adrenals/Urinary Tract: Adrenal glands appear normal. Right nephrostomy catheter is well positioned within the right renal pelvis. However, the left nephrostomy catheter appears to have been significantly withdrawn outside of the collecting system. Moderate left hydroureteronephrosis is noted. 3 mm distal right ureteral calculus is noted. Significant wall thickening of urinary bladder is noted consistent with history of bladder cancer. Stomach/Bowel: Stomach is within normal limits. Appendix appears normal. No evidence of bowel wall thickening, distention, or inflammatory changes. Vascular/Lymphatic: Aortic atherosclerosis. No enlarged abdominal or pelvic lymph nodes. Reproductive: Prostate is unremarkable. Other: No abdominal wall hernia or abnormality. No abdominopelvic  ascites. Musculoskeletal: No acute or significant osseous findings. IMPRESSION: Left  nephrostomy catheter appears to have significantly withdrawn outside of the collecting system of left kidney. Moderate left hydroureteronephrosis is now noted. Stable wall thickening of urinary bladder is noted consistent with history of bladder carcinoma. Right nephrostomy catheter is well positioned within right renal collecting system without hydronephrosis. Aortic atherosclerosis. Electronically Signed   By: Marijo Conception, M.D.   On: 01/16/2017 15:30      Medications: I have reviewed the patient's current medications.  Assessment/Plan:  83 year old gentleman with the following issues:  1. High-grade urothelial carcinoma, plasmacytoid variant diagnosed in July 2018. He presented with bilateral hydronephrosis and acute renal failure. He is status post TURBT on 11/11/2016 confirmed the presence of muscle invasive disease. PET/CT scan on 12/14/2016  showed no evidence of metastatic disease.  He is currently receiving palliative radiation and chemotherapy for local control.  Goals of therapy were reviewed again with the patient and continues to be palliative at best. Despite the fact that he does not have disease outside of the bladder, he is quite debilitated and I doubt he will receive any further therapy.  I have left the option of resuming therapy at this time and I also discussed the option of comfort care only especially if he does not recover adequately from this episode.   2. Urosepsis: Related to a malfunctioning nephrostomy tube that has been replaced. He is currently receiving intravenous antibiotics and pressors.  3. Goals of care and prognosis: His prognosis is poor from his malignancy and overall debilitation. I agree with DO NOT RESUSCITATE status and consider transitioning to comfort care if his condition does not improve or deteriorates.     LOS: 1 day   Zola Button 01/17/2017, 7:38 AM

## 2017-01-17 NOTE — Progress Notes (Signed)
PHARMACY - PHYSICIAN COMMUNICATION CRITICAL VALUE ALERT - BLOOD CULTURE IDENTIFICATION (BCID)  Results for orders placed or performed during the hospital encounter of 01/16/17  Blood Culture ID Panel (Reflexed) (Collected: 01/16/2017  3:20 PM)  Result Value Ref Range   Enterococcus species NOT DETECTED NOT DETECTED   Listeria monocytogenes NOT DETECTED NOT DETECTED   Staphylococcus species NOT DETECTED NOT DETECTED   Staphylococcus aureus NOT DETECTED NOT DETECTED   Streptococcus species NOT DETECTED NOT DETECTED   Streptococcus agalactiae NOT DETECTED NOT DETECTED   Streptococcus pneumoniae NOT DETECTED NOT DETECTED   Streptococcus pyogenes NOT DETECTED NOT DETECTED   Acinetobacter baumannii NOT DETECTED NOT DETECTED   Enterobacteriaceae species DETECTED (A) NOT DETECTED   Enterobacter cloacae complex NOT DETECTED NOT DETECTED   Escherichia coli NOT DETECTED NOT DETECTED   Klebsiella oxytoca NOT DETECTED NOT DETECTED   Klebsiella pneumoniae DETECTED (A) NOT DETECTED   Proteus species NOT DETECTED NOT DETECTED   Serratia marcescens NOT DETECTED NOT DETECTED   Carbapenem resistance NOT DETECTED NOT DETECTED   Haemophilus influenzae NOT DETECTED NOT DETECTED   Neisseria meningitidis NOT DETECTED NOT DETECTED   Pseudomonas aeruginosa NOT DETECTED NOT DETECTED   Candida albicans NOT DETECTED NOT DETECTED   Candida glabrata NOT DETECTED NOT DETECTED   Candida krusei NOT DETECTED NOT DETECTED   Candida parapsilosis NOT DETECTED NOT DETECTED   Candida tropicalis NOT DETECTED NOT DETECTED    Name of physician (or Provider) Contacted: Singh  Changes to prescribed antibiotics required: Vancomycin d/c'd.  Biagio Borg 01/17/2017  7:59 AM

## 2017-01-17 NOTE — Progress Notes (Signed)
Urology Progress Note   Subjective: Left nephrostomy tube replaced by VIR. Post-procedurally experiencedfever to 102.20F, tachycardia, and hypotension. Admitted to ICU. Improved this AM. Afebrile, HR 80s, BP improved.   Objective: Vital signs in last 24 hours: Temp:  [97.3 F (36.3 C)-99.2 F (37.3 C)] 97.3 F (36.3 C) (10/16 0808) Pulse Rate:  [60-93] 63 (10/16 0205) Resp:  [12-26] 14 (10/16 0600) BP: (83-112)/(46-66) 98/63 (10/16 0600) SpO2:  [95 %-100 %] 98 % (10/16 0205)  Intake/Output from previous day: 10/15 0701 - 10/16 0700 In: 595 [I.V.:525; IV Piggyback:50] Out: 1190 [Urine:760; Stool:430] Intake/Output this shift: No intake/output data recorded.  Physical Exam:  General: Alert and oriented CV: RRR Lungs: Clear Abdomen: Soft, nondistended GU: Bilateral nephrostomy tubes in place. Bilateral PCNs draining dark yellow urine.  Ext: NT, No erythema  Lab Results:  Recent Labs  01/16/17 1748 01/17/17 0427 01/18/17 0500  HGB 10.3* 8.7* 8.0*  HCT 31.4* 25.5* 23.2*   BMET  Recent Labs  01/17/17 0427 01/18/17 0500  NA 136 137  K 3.4* 3.2*  CL 109 110  CO2 21* 21*  GLUCOSE 116* 173*  BUN 29* 28*  CREATININE 2.38* 1.66*  CALCIUM 7.3* 7.4*    Assessment/Plan:  83 y.o. male with a history of metastatic plasmacytoid bladder cancer with bilateral malignant ureteral obstruction, admitted 01/16/17 with a dislodged left nephrostomy tube with resultant urosepsis. S/p Left PCN replacement by VIR on 01/16/17 with subsequent improvement. Admitted to ICU on antitbiotics.   - Leave nephrostomy tubes to drain - Continue treating sepsis per primary team.  - Urology will continue to be available PRN    LOS: 2 days   Stasia Cavalier 01/18/2017, 8:38 AM  I've spoken with Dr. Jess Barters about the patient.  I reviewed the note, assessment and plan and agree with the above.

## 2017-01-17 NOTE — Progress Notes (Signed)
Initial Nutrition Assessment  DOCUMENTATION CODES:   Not applicable  INTERVENTION:   - Will order Boost Breeze TID, each supplement provides 250 kcal and 9 grams of protein - Will order daily multivitamin with minerals. - Diet advancement as medically feasible.  NUTRITION DIAGNOSIS:   Increased nutrient needs related to catabolic illness, cancer and cancer related treatments as evidenced by estimated needs.  GOAL:   Patient will meet greater than or equal to 90% of their needs  MONITOR:   PO intake, Supplement acceptance, Diet advancement, Weight trends, Labs  REASON FOR ASSESSMENT:   Malnutrition Screening Tool  ASSESSMENT:   83 y.o. 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 Alen Blew status post 3 chemotherapy cycles and 15 radiation cycles all palliative, essential hypertension, dyslipidemia who lives at home, comes in with a few day history of lower abdominal discomfort and mild diarrhea, today he noted that his left nephrostomy tube had bloody drainage, came to the ER where workup showed sepsis, fever, ARF CT evidence of left-sided hydronephrosis and partially dislodged left nephrostomy tube.  Pt seen for MST. BMI indicates normal weight. Pt on CLD with no documented intakes. Pt noted to be a/o to self only and no family/visitors present at this time. Pt sleeping at time of RD arrival but awoke to name call x2. He denies abdominal pain or nausea at this time and is able to recall having sips of water earlier this AM. He denies coughing with sips of water. When asked about appetite PTA, pt states "I have not been home in 2-3 days."   Physical assessment shows no fat wasting, mild/moderate muscle wasting noted to shoulder and temporal regions, no edema. Per chart review, pt has lost 8 lbs (5% body weight) in the past 5 weeks.  This is not significant for time frame. Pt does not meet criteria for malnutrition based on ASPEN guidelines but is at high risk for malnutrition.  Medications reviewed; 40 mEq oral KCl x2 doses today.  Labs reviewed; K: 3.4 mmol/L, BUN: 29 mg/dL, creatinine: 2.38 mg/dL, Ca: 7.3 mg/dL, GFR: 24 mL/min.   IVF: LR @ 75 mL/hr.    Diet Order:  Diet clear liquid Room service appropriate? Yes; Fluid consistency: Thin  Skin:  Wound (see comment) (Stage 2 bilateral buttocks pressure injuries)  Last BM:  10/15  Height:   Ht Readings from Last 1 Encounters:  01/17/17 5\' 11"  (1.803 m)    Weight:   Wt Readings from Last 1 Encounters:  01/16/17 141 lb 1.5 oz (64 kg)    Ideal Body Weight:  78.18 kg  BMI:  Body mass index is 19.68 kg/m.  Estimated Nutritional Needs:   Kcal:  1920-2115 (30-33 kcal/kg)  Protein:  90-100 grams  Fluid:  >/= 2 L/day  EDUCATION NEEDS:   No education needs identified at this time     Jarome Matin, MS, RD, LDN, CNSC Inpatient Clinical Dietitian Pager # [phone] After hours/weekend pager # [phone]

## 2017-01-18 ENCOUNTER — Other Ambulatory Visit: Payer: Medicare Other

## 2017-01-18 ENCOUNTER — Ambulatory Visit: Payer: Medicare Other | Admitting: Oncology

## 2017-01-18 ENCOUNTER — Ambulatory Visit: Payer: Medicare Other

## 2017-01-18 ENCOUNTER — Encounter (HOSPITAL_COMMUNITY): Payer: Self-pay | Admitting: Interventional Radiology

## 2017-01-18 LAB — CBC
HEMATOCRIT: 23.2 % — AB (ref 39.0–52.0)
HEMOGLOBIN: 8 g/dL — AB (ref 13.0–17.0)
MCH: 30.9 pg (ref 26.0–34.0)
MCHC: 34.5 g/dL (ref 30.0–36.0)
MCV: 89.6 fL (ref 78.0–100.0)
Platelets: 73 10*3/uL — ABNORMAL LOW (ref 150–400)
RBC: 2.59 MIL/uL — ABNORMAL LOW (ref 4.22–5.81)
RDW: 15.4 % (ref 11.5–15.5)
WBC: 4.1 10*3/uL (ref 4.0–10.5)

## 2017-01-18 LAB — COMPREHENSIVE METABOLIC PANEL
ALBUMIN: 1.9 g/dL — AB (ref 3.5–5.0)
ALT: 46 U/L (ref 17–63)
AST: 44 U/L — AB (ref 15–41)
Alkaline Phosphatase: 53 U/L (ref 38–126)
Anion gap: 6 (ref 5–15)
BILIRUBIN TOTAL: 0.5 mg/dL (ref 0.3–1.2)
BUN: 28 mg/dL — AB (ref 6–20)
CHLORIDE: 110 mmol/L (ref 101–111)
CO2: 21 mmol/L — AB (ref 22–32)
Calcium: 7.4 mg/dL — ABNORMAL LOW (ref 8.9–10.3)
Creatinine, Ser: 1.66 mg/dL — ABNORMAL HIGH (ref 0.61–1.24)
GFR calc Af Amer: 43 mL/min — ABNORMAL LOW (ref >60)
GFR calc non Af Amer: 37 mL/min — ABNORMAL LOW (ref >60)
GLUCOSE: 173 mg/dL — AB (ref 65–99)
POTASSIUM: 3.2 mmol/L — AB (ref 3.5–5.1)
Sodium: 137 mmol/L (ref 135–145)
Total Protein: 4.6 g/dL — ABNORMAL LOW (ref 6.5–8.1)

## 2017-01-18 LAB — PROCALCITONIN: PROCALCITONIN: 5.41 ng/mL

## 2017-01-18 MED ORDER — POTASSIUM CHLORIDE CRYS ER 20 MEQ PO TBCR
40.0000 meq | EXTENDED_RELEASE_TABLET | Freq: Four times a day (QID) | ORAL | Status: DC
  Filled 2017-01-18: qty 2

## 2017-01-18 MED ORDER — MEROPENEM 1 G IV SOLR
1.0000 g | Freq: Two times a day (BID) | INTRAVENOUS | Status: DC
  Administered 2017-01-18 – 2017-01-21 (×6): 1 g via INTRAVENOUS
  Filled 2017-01-18 (×6): qty 1

## 2017-01-18 MED ORDER — POTASSIUM CHLORIDE 10 MEQ/100ML IV SOLN
10.0000 meq | INTRAVENOUS | Status: AC
  Administered 2017-01-18 (×4): 10 meq via INTRAVENOUS
  Filled 2017-01-18 (×4): qty 100

## 2017-01-18 MED ORDER — DIPHENHYDRAMINE HCL 50 MG PO CAPS
50.0000 mg | ORAL_CAPSULE | Freq: Once | ORAL | Status: AC
  Administered 2017-01-19: 50 mg via ORAL
  Filled 2017-01-18: qty 1

## 2017-01-18 MED ORDER — MAGNESIUM SULFATE IN D5W 1-5 GM/100ML-% IV SOLN
1.0000 g | Freq: Once | INTRAVENOUS | Status: AC
  Administered 2017-01-18: 1 g via INTRAVENOUS
  Filled 2017-01-18: qty 100

## 2017-01-18 MED ORDER — HALOPERIDOL LACTATE 5 MG/ML IJ SOLN
0.5000 mg | Freq: Four times a day (QID) | INTRAMUSCULAR | Status: DC | PRN
  Administered 2017-01-18: 0.5 mg via INTRAVENOUS
  Filled 2017-01-18: qty 1

## 2017-01-18 MED ORDER — LORAZEPAM 2 MG/ML IJ SOLN
0.5000 mg | Freq: Once | INTRAMUSCULAR | Status: AC
  Administered 2017-01-18: 0.5 mg via INTRAVENOUS
  Filled 2017-01-18: qty 1

## 2017-01-18 MED ORDER — LOPERAMIDE HCL 2 MG PO CAPS: 2.0000 mg | ORAL_CAPSULE | Freq: Four times a day (QID) | ORAL | Status: DC | PRN

## 2017-01-18 MED ORDER — DIPHENHYDRAMINE HCL 25 MG PO CAPS
25.0000 mg | ORAL_CAPSULE | ORAL | Status: DC | PRN
  Administered 2017-01-19 – 2017-01-20 (×3): 25 mg via ORAL
  Filled 2017-01-18 (×3): qty 1

## 2017-01-18 NOTE — Progress Notes (Addendum)
$'@IPLOG'W$ @        PROGRESS NOTE                                                                                                                                                                                                             Patient Demographics:    Randall Little, is a 83 y.o. male, DOB - 10-13-1932, YBO:[id number]  Admit date - 01/16/2017   Admitting Physician Thurnell Lose, MD  Outpatient Primary MD for the patient is Leonard Downing, MD  LOS - 2  Chief Complaint  Patient presents with  . Abdominal Pain       Brief Narrative   Randall Little  is a 83 y.o. 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 Alen Blew status post 3 chemotherapy cycles and 15 radiation cycles all palliative, essential hypertension, dyslipidemia who lives at home, comes in with a few day history of lower abdominal discomfort and mild diarrhea, today he noted that his left nephrostomy tube had bloody drainage, came to the ER where workup showed sepsis, fever, ARF CT evidence of left-sided hydronephrosis and partially dislodged left nephrostomy tube.    Subjective:    Randall Little today somnolent from the Ativan he received early this morning for agitation, appears comfortable, currently unable to onset questions reliably.    Assessment  & Plan :     1. Sepsis due to partially dislodged left nephrostomy tube with hydronephrosis and  gram-negative bacteremia and sepsis. Continue monitoring andstepdown, received IV bolus and continues to receive IV fluids for blood pressure support, he initially required IV pressors however after stress dose steroids were added pressors have been titrated off successfully, blood pressures have stabilized, sepsis physiology seems to be resolving, continue IV meropenem, follow final blood culture report,  pro-calcitonin lactate much improved. IR has already replaced the dislodged left nephrostomy tube on the day of admission.  2. Urothelial bladder cancer. Discussed his case with his oncologist Dr. Alen Blew, all treatments are palliative, supportive care for now.  3. ARF. Due to #1 & 5. lan as in #1 above along with IV fluids, renal function improving gradually.  4. Hypertension. Currently blood pressure low. Hold blood pressure medications.  5. Diarrhea. Likely due to radiation exposure. Negative C. Difficile will start using Imodium when necessary. Kindly discontinue rectal tube on 01/19/2017 if diarrhea is better.  6. Anemia and thrombocytopenia. Due to combination of his chemotherapy, sepsis,  some element of dilution &  also had some blood loss in his nephrostomy tube in the ER on the left side,at this time heparin discontinued, no signs of ongoing bleeding, monitor CBC closely, type screen done. Anemia panel largely inconclusive with mixed results.  7. Medication induced encephalopathy. Discontinue Ativan, use Haldol if needed.  8. Hypokalemia. Replaced.    Diet : DIET SOFT Room service appropriate? Yes; Fluid consistency: Thin    Family Communication  : daughter  Code Status :  DNR  Disposition Plan  :  Step down  Consults  :  Urology, IR, Oncology  Procedures  :    CT -  IMPRESSION: Left nephrostomy catheter appears to have significantly withdrawn outside of the collecting system of left kidney. Moderate left hydroureteronephrosis is now noted. Stable wall thickening of urinary bladder is noted consistent with history of bladder carcinoma. Right nephrostomy catheter is well positioned within right renal collecting system without hydronephrosis. Aortic atherosclerosis.   Replacement of malpositioned left percutaneous nephrostomy tube on 01/16/2017 by IR physician Dr.Yamagata.    DVT Prophylaxis  :  SCDs  Lab Results  Component Value Date   PLT 73 (L) 01/18/2017     Inpatient Medications  Scheduled Meds: . Chlorhexidine Gluconate Cloth  6 each Topical Daily  . feeding supplement  1 Container Oral TID BM  . hydrocortisone sod succinate (SOLU-CORTEF) inj  100 mg Intravenous Q8H  . multivitamin with minerals  1 tablet Oral Daily  . potassium chloride  40 mEq Oral Q6H  . sodium chloride flush  10 mL Intravenous Q8H   Continuous Infusions: . lactated ringers 75 mL/hr at 01/18/17 0325  . meropenem (MERREM) IV 500 mg (01/18/17 0645)  . phenylephrine (NEO-SYNEPHRINE) Adult infusion Stopped (01/17/17 0505)  . sodium chloride     PRN Meds:.acetaminophen, albuterol, fentaNYL (SUBLIMAZE) injection, haloperidol lactate, iopamidol, ondansetron (ZOFRAN) IV, prochlorperazine, sodium chloride, sodium chloride flush  Antibiotics  :    Anti-infectives    Start     Dose/Rate Route Frequency Ordered Stop   01/16/17 1800  meropenem (MERREM) 500 mg in sodium chloride 0.9 % 50 mL IVPB     500 mg 100 mL/hr over 30 Minutes Intravenous Every 12 hours 01/16/17 1630     01/16/17 1700  metroNIDAZOLE (FLAGYL) IVPB 500 mg  Status:  Discontinued     500 mg 100 mL/hr over 60 Minutes Intravenous Every 8 hours 01/16/17 1618 01/16/17 1631   01/16/17 1700  vancomycin (VANCOCIN) IVPB 1000 mg/200 mL premix     1,000 mg 200 mL/hr over 60 Minutes Intravenous  Once 01/16/17 1629 01/16/17 1810   01/16/17 1500  levofloxacin (LEVAQUIN) IVPB 750 mg  Status:  Discontinued     750 mg 100 mL/hr over 90 Minutes Intravenous  Once 01/16/17 1451 01/16/17 1618   01/16/17 1500  metroNIDAZOLE (FLAGYL) IVPB 500 mg  Status:  Discontinued     500 mg 100 mL/hr over 60 Minutes Intravenous  Once 01/16/17 1451 01/16/17 1618         Objective:   Vitals:   01/18/17 0400 01/18/17 0500 01/18/17 0600 01/18/17 0808  BP: (!) 99/51 (!) 90/54 98/63   Pulse:      Resp: '16 14 14   '$ Temp:    (!) 97.3 F (36.3 C)  TempSrc:    Axillary  SpO2:      Weight:      Height:        Wt Readings from  Last 3 Encounters:  01/16/17 64  kg (141 lb 1.5 oz)  01/11/17 64.1 kg (141 lb 6.4 oz)  01/07/17 64.1 kg (141 lb 4 oz)     Intake/Output Summary (Last 24 hours) at 01/18/17 0858 Last data filed at 01/18/17 0645  Gross per 24 hour  Intake              595 ml  Output             1185 ml  Net             -590 ml     Physical Exam  Somnolent but in no distress, moving all 4 extremities by himself [place].AT,PERRAL Supple Neck,No JVD, No cervical lymphadenopathy appriciated.  Symmetrical Chest wall movement, Good air movement bilaterally, CTAB RRR,No Gallops,Rubs or new Murmurs, No Parasternal Heave +ve B.Sounds, Abd Soft, No tenderness, No organomegaly appriciated, No rebound - guarding or rigidity. Bilateral nephrostomy tubes in place, rectal tube placed in the stepdown No Cyanosis, Clubbing or edema, No new Rash or bruise     Data Review:    CBC  Recent Labs Lab 01/11/17 0953 01/16/17 1219 01/16/17 1748 01/17/17 0427 01/18/17 0500  WBC 4.5 8.4 9.2 7.1 4.1  HGB 11.3* 16.4 10.3* 8.7* 8.0*  HCT 34.0* 47.8 31.4* 25.5* 23.2*  PLT 180 111* 83* 67* 73*  MCV 90.4 90.0 91.5 90.1 89.6  MCH 30.0 30.9 30.0 30.7 30.9  MCHC 33.2 34.3 32.8 34.1 34.5  RDW 14.3 15.2 15.3 15.3 15.4  LYMPHSABS 0.4*  --   --   --   --   MONOABS 0.5  --   --   --   --   EOSABS 0.2  --   --   --   --   BASOSABS 0.0  --   --   --   --     Chemistries   Recent Labs Lab 01/11/17 0953 01/16/17 1219 01/16/17 1748 01/17/17 0427 01/18/17 0500  NA 139 136  --  136 137  K 4.3 3.7  --  3.4* 3.2*  CL  --  102  --  109 110  CO2 28 21*  --  21* 21*  GLUCOSE 105 161*  --  116* 173*  BUN 17.1 33*  --  29* 28*  CREATININE 1.2 3.18* 2.95* 2.38* 1.66*  CALCIUM 9.0 8.1*  --  7.3* 7.4*  AST 21 26  --  21 44*  ALT 24 25  --  23 46  ALKPHOS 86 73  --  60 53  BILITOT 0.58 1.1  --  0.6 0.5   ------------------------------------------------------------------------------------------------------------------ No  results for input(s): CHOL, HDL, LDLCALC, TRIG, CHOLHDL, LDLDIRECT in the last 72 hours.  No results found for: HGBA1C ------------------------------------------------------------------------------------------------------------------ No results for input(s): TSH, T4TOTAL, T3FREE, THYROIDAB in the last 72 hours.  Invalid input(s): FREET3 ------------------------------------------------------------------------------------------------------------------  Recent Labs  01/17/17 1213  VITAMINB12 343  FOLATE 22.0  FERRITIN 631*  TIBC 136*  IRON 7*  RETICCTPCT 1.1    Coagulation profile  Recent Labs Lab 01/16/17 1748  INR 1.34    No results for input(s): DDIMER in the last 72 hours.  Cardiac Enzymes No results for input(s): CKMB, TROPONINI, MYOGLOBIN in the last 168 hours.  Invalid input(s): CK ------------------------------------------------------------------------------------------------------------------ No results found for: BNP  Micro Results Recent Results (from the past 240 hour(s))  Blood Culture (routine x 2)     Status: None (Preliminary result)   Collection Time: 01/16/17  2:52 PM  Result Value Ref Range Status  Specimen Description BLOOD LEFT ANTECUBITAL  Final   Special Requests   Final    BOTTLES DRAWN AEROBIC AND ANAEROBIC Blood Culture adequate volume   Culture  Setup Time   Final    GRAM NEGATIVE RODS IN BOTH AEROBIC AND ANAEROBIC BOTTLES CRITICAL VALUE NOTED.  VALUE IS CONSISTENT WITH PREVIOUSLY REPORTED AND CALLED VALUE. Performed at [facility], [place] [address], [place], [place] [address]    Culture GRAM NEGATIVE RODS  Final   Report Status PENDING  Incomplete  Blood Culture (routine x 2)     Status: None (Preliminary result)   Collection Time: 01/16/17  3:20 PM  Result Value Ref Range Status   Specimen Description BLOOD PORTA CATH  Final   Special Requests   Final    BOTTLES DRAWN AEROBIC AND ANAEROBIC Blood Culture adequate volume    Culture  Setup Time   Final    IN BOTH AEROBIC AND ANAEROBIC BOTTLES GRAM NEGATIVE RODS CRITICAL RESULT CALLED TO, READ BACK BY AND VERIFIED WITH: Nani Skillern [id number] MLM Performed at [facility], [place] [address], [place], [place] [address]    Culture GRAM NEGATIVE RODS  Final   Report Status PENDING  Incomplete  Blood Culture ID Panel (Reflexed)     Status: Abnormal   Collection Time: 01/16/17  3:20 PM  Result Value Ref Range Status   Enterococcus species NOT DETECTED NOT DETECTED Final   Listeria monocytogenes NOT DETECTED NOT DETECTED Final   Staphylococcus species NOT DETECTED NOT DETECTED Final   Staphylococcus aureus NOT DETECTED NOT DETECTED Final   Streptococcus species NOT DETECTED NOT DETECTED Final   Streptococcus agalactiae NOT DETECTED NOT DETECTED Final   Streptococcus pneumoniae NOT DETECTED NOT DETECTED Final   Streptococcus pyogenes NOT DETECTED NOT DETECTED Final   Acinetobacter baumannii NOT DETECTED NOT DETECTED Final   Enterobacteriaceae species DETECTED (A) NOT DETECTED Final    Comment: Enterobacteriaceae represent a large family of gram-negative bacteria, not a single organism. CRITICAL RESULT CALLED TO, READ BACK BY AND VERIFIED WITH: PHARMD N [place] [id number] MLM    Enterobacter cloacae complex NOT DETECTED NOT DETECTED Final   Escherichia coli NOT DETECTED NOT DETECTED Final   Klebsiella oxytoca NOT DETECTED NOT DETECTED Final   Klebsiella pneumoniae DETECTED (A) NOT DETECTED Final    Comment: CRITICAL RESULT CALLED TO, READ BACK BY AND VERIFIED WITH: PHARMD N GLOGOVAC [id number] MLM    Proteus species NOT DETECTED NOT DETECTED Final   Serratia marcescens NOT DETECTED NOT DETECTED Final   Carbapenem resistance NOT DETECTED NOT DETECTED Final   Haemophilus influenzae NOT DETECTED NOT DETECTED Final   Neisseria meningitidis NOT DETECTED NOT DETECTED Final   Pseudomonas aeruginosa NOT DETECTED NOT DETECTED Final   Candida albicans NOT  DETECTED NOT DETECTED Final   Candida glabrata NOT DETECTED NOT DETECTED Final   Candida krusei NOT DETECTED NOT DETECTED Final   Candida parapsilosis NOT DETECTED NOT DETECTED Final   Candida tropicalis NOT DETECTED NOT DETECTED Final    Comment: Performed at [facility], [address] [place], [place] [address]  Urine Culture     Status: Abnormal (Preliminary result)   Collection Time: 01/16/17  6:00 PM  Result Value Ref Range Status   Specimen Description URINE, RANDOM  Final   Special Requests Immunocompromised  Final   Culture 20,000 COLONIES/mL GRAM NEGATIVE RODS (A)  Final   Report Status PENDING  Incomplete  MRSA PCR Screening  Status: None   Collection Time: 01/16/17  6:00 PM  Result Value Ref Range Status   MRSA by PCR NEGATIVE NEGATIVE Final    Comment:        The GeneXpert MRSA Assay (FDA approved for NASAL specimens only), is one component of a comprehensive MRSA colonization surveillance program. It is not intended to diagnose MRSA infection nor to guide or monitor treatment for MRSA infections.   C difficile quick scan w PCR reflex     Status: None   Collection Time: 01/16/17 11:36 PM  Result Value Ref Range Status   C Diff antigen NEGATIVE NEGATIVE Final   C Diff toxin NEGATIVE NEGATIVE Final   C Diff interpretation No C. difficile detected.  Final    Radiology Reports   Ct Abdomen Pelvis Wo Contrast  Result Date: 01/16/2017 CLINICAL DATA:  Abdominal pain, weakness. EXAM: CT ABDOMEN AND PELVIS WITHOUT CONTRAST TECHNIQUE: Multidetector CT imaging of the abdomen and pelvis was performed following the standard protocol without IV contrast. COMPARISON:  PET scan of December 14, 2016. FINDINGS: Lower chest: No acute abnormality. Hepatobiliary: No focal liver abnormality is seen. Status post cholecystectomy. No biliary dilatation. Pancreas: Unremarkable. No pancreatic ductal dilatation or surrounding inflammatory changes. Spleen: Normal in size without  focal abnormality. Adrenals/Urinary Tract: Adrenal glands appear normal. Right nephrostomy catheter is well positioned within the right renal pelvis. However, the left nephrostomy catheter appears to have been significantly withdrawn outside of the collecting system. Moderate left hydroureteronephrosis is noted. 3 mm distal right ureteral calculus is noted. Significant wall thickening of urinary bladder is noted consistent with history of bladder cancer. Stomach/Bowel: Stomach is within normal limits. Appendix appears normal. No evidence of bowel wall thickening, distention, or inflammatory changes. Vascular/Lymphatic: Aortic atherosclerosis. No enlarged abdominal or pelvic lymph nodes. Reproductive: Prostate is unremarkable. Other: No abdominal wall hernia or abnormality. No abdominopelvic ascites. Musculoskeletal: No acute or significant osseous findings. IMPRESSION: Left nephrostomy catheter appears to have significantly withdrawn outside of the collecting system of left kidney. Moderate left hydroureteronephrosis is now noted. Stable wall thickening of urinary bladder is noted consistent with history of bladder carcinoma. Right nephrostomy catheter is well positioned within right renal collecting system without hydronephrosis. Aortic atherosclerosis. Electronically Signed   By: Marijo Conception, M.D.   On: 01/16/2017 15:30   Dg Chest 2 View  Result Date: 01/16/2017 CLINICAL DATA:  Weakness, abdominal pain. Patient is being treated for bladder carcinoma. EXAM: CHEST  2 VIEW COMPARISON:  PET-CT 12/14/2016 FINDINGS: Cardiomediastinal silhouette is normal. Mediastinal contours appear intact. Tortuosity and calcific atherosclerotic disease of the aorta. There is no evidence of focal airspace consolidation, pleural effusion or pneumothorax. Streaky opacities in bilateral lung bases, right greater than left, likely represent atelectasis versus scarring. Osseous structures are without acute abnormality. Soft tissues  are grossly normal. IMPRESSION: Bibasilar atelectasis versus scarring. Calcific atherosclerotic disease of the aorta. Electronically Signed   By: Fidela Salisbury M.D.   On: 01/16/2017 13:17   Ir US Guide Vasc Access Right  Result Date: 01/07/2017 INDICATION: History of bladder cancer. In need of durable intravenous access for chemotherapy administration. EXAM: IMPLANTED PORT A CATH PLACEMENT WITH ULTRASOUND AND FLUOROSCOPIC GUIDANCE COMPARISON:  None. MEDICATIONS: Vancomycin 1 gm IV; The antibiotic was administered within an appropriate time interval prior to skin puncture. ANESTHESIA/SEDATION: Moderate (conscious) sedation was employed during this procedure. A total of Versed 2 mg and Fentanyl 100 mcg was administered intravenously. Moderate Sedation Time: 50 minutes. The patient's level of consciousness and  vital signs were monitored continuously by radiology nursing throughout the procedure under my direct supervision. CONTRAST:  None FLUOROSCOPY TIME:  18 seconds (3 mGy) COMPLICATIONS: None immediate. PROCEDURE: The procedure, risks, benefits, and alternatives were explained to the patient. Questions regarding the procedure were encouraged and answered. The patient understands and consents to the procedure. The right neck and chest were prepped with chlorhexidine in a sterile fashion, and a sterile drape was applied covering the operative field. Maximum barrier sterile technique with sterile gowns and gloves were used for the procedure. A timeout was performed prior to the initiation of the procedure. Local anesthesia was provided with 1% lidocaine with epinephrine. After creating a small venotomy incision, a micropuncture kit was utilized to access the internal jugular vein. Real-time ultrasound guidance was utilized for vascular access including the acquisition of a permanent ultrasound image documenting patency of the accessed vessel. The microwire was utilized to measure appropriate catheter length.  A subcutaneous port pocket was then created along the upper chest wall utilizing a combination of sharp and blunt dissection. The pocket was irrigated with sterile saline. A single lumen ISP power injectable port was chosen for placement. The 8 Fr catheter was tunneled from the port pocket site to the venotomy incision. The port was placed in the pocket. The external catheter was trimmed to appropriate length. At the venotomy, an 8 Fr peel-away sheath was placed over a guidewire under fluoroscopic guidance. The catheter was then placed through the sheath and the sheath was removed. Final catheter positioning was confirmed and documented with a fluoroscopic spot radiograph. The port was accessed with a Huber needle, aspirated and flushed with heparinized saline. The venotomy site was closed with an interrupted 4-0 Vicryl suture. The port pocket incision was closed with interrupted 2-0 Vicryl suture and the skin was opposed with a running subcuticular 4-0 Vicryl suture. Dermabond and Steri-strips were applied to both incisions. Dressings were placed. The patient tolerated the procedure well without immediate post procedural complication. FINDINGS: After catheter placement, the tip lies within the superior cavoatrial junction. The catheter aspirates and flushes normally and is ready for immediate use. IMPRESSION: Successful placement of a right internal jugular approach power injectable Port-A-Cath. The catheter is ready for immediate use. Electronically Signed   By: Simonne Come M.D.   On: 01/07/2017 17:36   Ir Fluoro Guide Port Insertion Right  Result Date: 01/07/2017 INDICATION: History of bladder cancer. In need of durable intravenous access for chemotherapy administration. EXAM: IMPLANTED PORT A CATH PLACEMENT WITH ULTRASOUND AND FLUOROSCOPIC GUIDANCE COMPARISON:  None. MEDICATIONS: Vancomycin 1 gm IV; The antibiotic was administered within an appropriate time interval prior to skin puncture.  ANESTHESIA/SEDATION: Moderate (conscious) sedation was employed during this procedure. A total of Versed 2 mg and Fentanyl 100 mcg was administered intravenously. Moderate Sedation Time: 50 minutes. The patient's level of consciousness and vital signs were monitored continuously by radiology nursing throughout the procedure under my direct supervision. CONTRAST:  None FLUOROSCOPY TIME:  18 seconds (3 mGy) COMPLICATIONS: None immediate. PROCEDURE: The procedure, risks, benefits, and alternatives were explained to the patient. Questions regarding the procedure were encouraged and answered. The patient understands and consents to the procedure. The right neck and chest were prepped with chlorhexidine in a sterile fashion, and a sterile drape was applied covering the operative field. Maximum barrier sterile technique with sterile gowns and gloves were used for the procedure. A timeout was performed prior to the initiation of the procedure. Local anesthesia was provided with 1%  lidocaine with epinephrine. After creating a small venotomy incision, a micropuncture kit was utilized to access the internal jugular vein. Real-time ultrasound guidance was utilized for vascular access including the acquisition of a permanent ultrasound image documenting patency of the accessed vessel. The microwire was utilized to measure appropriate catheter length. A subcutaneous port pocket was then created along the upper chest wall utilizing a combination of sharp and blunt dissection. The pocket was irrigated with sterile saline. A single lumen ISP power injectable port was chosen for placement. The 8 Fr catheter was tunneled from the port pocket site to the venotomy incision. The port was placed in the pocket. The external catheter was trimmed to appropriate length. At the venotomy, an 8 Fr peel-away sheath was placed over a guidewire under fluoroscopic guidance. The catheter was then placed through the sheath and the sheath was removed.  Final catheter positioning was confirmed and documented with a fluoroscopic spot radiograph. The port was accessed with a Huber needle, aspirated and flushed with heparinized saline. The venotomy site was closed with an interrupted 4-0 Vicryl suture. The port pocket incision was closed with interrupted 2-0 Vicryl suture and the skin was opposed with a running subcuticular 4-0 Vicryl suture. Dermabond and Steri-strips were applied to both incisions. Dressings were placed. The patient tolerated the procedure well without immediate post procedural complication. FINDINGS: After catheter placement, the tip lies within the superior cavoatrial junction. The catheter aspirates and flushes normally and is ready for immediate use. IMPRESSION: Successful placement of a right internal jugular approach power injectable Port-A-Cath. The catheter is ready for immediate use. Electronically Signed   By: Sandi Mariscal M.D.   On: 01/07/2017 17:36    Time Spent in minutes  30   Lala Lund M.D on 01/18/2017 at 8:58 AM  Between 7am to 7pm - Pager - [phone] ( page via [email or web address], text pages only, please mention full 10 digit call back number). After 7pm go to [email or web address] - password [email or web address]

## 2017-01-18 NOTE — Progress Notes (Signed)
Pharmacy Antibiotic Note  Randall Little is a 83 y.o. male admitted on 01/16/2017 with sepsis and dislodged nephrostomy tube.  Tubed was replaced 10/14. PMH significant high grade urothelial carcinoma on weekly chemo.   Pharmacy has been consulted for Meropenem dosing.  01/18/2017:  Day#3 antibiotics  AKI- improving    Afebrile  Urine & Blood cx + Klebsiella pneumoniae (sens pending)  Plan: Increase Meropenem 1000mg  IV q12h. Daily SCr. F/U cx data & de-escalate antibiotics if appropriate  Height: 5\' 11"  (180.3 cm) Weight: 141 lb 1.5 oz (64 kg) IBW/kg (Calculated) : 75.3  Temp (24hrs), Avg:97.8 F (36.6 C), Min:97.3 F (36.3 C), Max:99.2 F (37.3 C)   Recent Labs Lab 01/16/17 1219 01/16/17 1231 01/16/17 1748 01/16/17 2007 01/17/17 0000 01/17/17 0427 01/18/17 0500  WBC 8.4  --  9.2  --   --  7.1 4.1  CREATININE 3.18*  --  2.95*  --   --  2.38* 1.66*  LATICACIDVEN  --  1.74  --  1.0 1.2  --   --     Estimated Creatinine Clearance: 31.1 mL/min (A) (by C-G formula based on SCr of 1.66 mg/dL (H)).    Allergies  Allergen Reactions  . Ativan [Lorazepam]     delirium  . Penicillin G Rash    Has patient had a PCN reaction causing immediate rash, facial/tongue/throat swelling, SOB or lightheadedness with hypotension: Yes Has patient had a PCN reaction causing severe rash involving mucus membranes or skin necrosis: No Has patient had a PCN reaction that required hospitalization: No Has patient had a PCN reaction occurring within the last 10 years: Yes If all of the above answers are "NO", then may proceed with Cephalosporin use.    Antimicrobials this admission: 10/14 Levaquin x 1 10/14 Flagyl x 1 10/14 Vancomycin >>10/15 10/14 Meropenem >>   Dose adjustments this admission: 10/16: Meropenem inc to 1gm BID for improved renal fxn (CrCl 30-50)   Microbiology results: 10/14 BCx: Klebsiella- sens pending 10/14 UCx: 20K Klebsiella (drawn after abx given)- sens  pending 10/14 Cdiff scan: negative 10/14 MRSA PCR: negative  Thank you for allowing pharmacy to be a part of this patient's care.  Biagio Borg 01/18/2017 11:32 AM

## 2017-01-19 ENCOUNTER — Ambulatory Visit: Payer: Medicare Other

## 2017-01-19 DIAGNOSIS — A419 Sepsis, unspecified organism: Secondary | ICD-10-CM

## 2017-01-19 DIAGNOSIS — N390 Urinary tract infection, site not specified: Secondary | ICD-10-CM

## 2017-01-19 LAB — COMPREHENSIVE METABOLIC PANEL
ALBUMIN: 1.9 g/dL — AB (ref 3.5–5.0)
ALK PHOS: 58 U/L (ref 38–126)
ALT: 48 U/L (ref 17–63)
ANION GAP: 5 (ref 5–15)
AST: 40 U/L (ref 15–41)
BUN: 31 mg/dL — ABNORMAL HIGH (ref 6–20)
CALCIUM: 7.9 mg/dL — AB (ref 8.9–10.3)
CHLORIDE: 109 mmol/L (ref 101–111)
CO2: 23 mmol/L (ref 22–32)
Creatinine, Ser: 1.35 mg/dL — ABNORMAL HIGH (ref 0.61–1.24)
GFR calc Af Amer: 55 mL/min — ABNORMAL LOW (ref >60)
GFR calc non Af Amer: 47 mL/min — ABNORMAL LOW (ref >60)
GLUCOSE: 162 mg/dL — AB (ref 65–99)
Potassium: 3.4 mmol/L — ABNORMAL LOW (ref 3.5–5.1)
SODIUM: 137 mmol/L (ref 135–145)
Total Bilirubin: 0.4 mg/dL (ref 0.3–1.2)
Total Protein: 4.7 g/dL — ABNORMAL LOW (ref 6.5–8.1)

## 2017-01-19 LAB — URINE CULTURE: Culture: 20000 — AB

## 2017-01-19 LAB — CBC
HCT: 23.7 % — ABNORMAL LOW (ref 39.0–52.0)
HEMOGLOBIN: 8.1 g/dL — AB (ref 13.0–17.0)
MCH: 30.2 pg (ref 26.0–34.0)
MCHC: 34.2 g/dL (ref 30.0–36.0)
MCV: 88.4 fL (ref 78.0–100.0)
PLATELETS: 87 10*3/uL — AB (ref 150–400)
RBC: 2.68 MIL/uL — AB (ref 4.22–5.81)
RDW: 15.3 % (ref 11.5–15.5)
WBC: 6 10*3/uL (ref 4.0–10.5)

## 2017-01-19 LAB — MAGNESIUM: Magnesium: 1.7 mg/dL (ref 1.7–2.4)

## 2017-01-19 MED ORDER — LOPERAMIDE HCL 2 MG PO CAPS
2.0000 mg | ORAL_CAPSULE | Freq: Three times a day (TID) | ORAL | Status: AC
  Administered 2017-01-19 – 2017-01-20 (×6): 2 mg via ORAL
  Filled 2017-01-19 (×6): qty 1

## 2017-01-19 MED ORDER — ROPINIROLE HCL 1 MG PO TABS
0.5000 mg | ORAL_TABLET | Freq: Every day | ORAL | Status: DC
  Administered 2017-01-19 – 2017-01-21 (×3): 0.5 mg via ORAL
  Administered 2017-01-22: 1 mg via ORAL
  Administered 2017-01-23: 0.5 mg via ORAL
  Filled 2017-01-19 (×5): qty 1

## 2017-01-19 MED ORDER — INFLUENZA VAC SPLIT HIGH-DOSE 0.5 ML IM SUSY
0.5000 mL | PREFILLED_SYRINGE | INTRAMUSCULAR | Status: AC
  Administered 2017-01-20: 0.5 mL via INTRAMUSCULAR
  Filled 2017-01-19: qty 0.5

## 2017-01-19 NOTE — Progress Notes (Signed)
No charge note:   Palliative consult received.   Meeting scheduled for tomorrow 01/20/17 at 12pm.  Mariana Kaufman, AGNP-C Palliative Medicine  Please call Palliative Medicine team phone with any questions [phone]. For individual providers please see AMION.

## 2017-01-19 NOTE — Progress Notes (Signed)
Per Marita Kansas, RN caring for patient in ICU stepdown the patient is stable but refusing radiation therapy today. Informed L3 of these findings.

## 2017-01-19 NOTE — Progress Notes (Signed)
PROGRESS NOTE    Randall Little  KWI:[id number] DOB: 1932/08/15 DOA: 01/16/2017 PCP: Leonard Downing, MD    Brief Narrative: 83 y.o.male,With history of urothelial bladder carcinoma causing bilateral hydronephrosis acquiring bilateral nephrostomy tube placement by IR, undergoing chemotherapy and radiation under the care of Dr. Alen Blew status post 3 chemotherapy cycles and 15 radiation cycles all palliative, essential hypertension, dyslipidemia who lives at home, comes in with a few day history of lower abdominal discomfort and mild diarrhea, today he noted that his left nephrostomy tube had bloody drainage, came to the ER where workup showed sepsis, fever, ARF CT evidence of left-sided hydronephrosis and partially dislodged left nephrostomy tube. patient seen today.he is resting in bed.he requested to see palliative care,and did not want to continue radiation.   Assessment & Plan:   Principal Problem:   Sepsis due to urinary tract infection ([place]) Active Problems:   Hyperlipidemia   Essential hypertension   Acute renal failure (HCC)   Hydronephrosis   Bladder tumor   Pressure injury of skin Sepsis due to partially dislodged left nephrostomy tube with hydronephrosis and  gram-negative bacteremia and sepsis. Continue monitoring andstepdown, received IV bolus and continues to receive IV fluids for blood pressure support, he initially required IV pressors however after stress dose steroids were added pressors have been titrated off successfully, blood pressures have stabilized, sepsis physiology seems to be resolving, continue IV meropenem, follow final blood culture report, pro-calcitonin lactate much improved. IR has already replaced the dislodged left nephrostomy tube on the day of admission.b/l nephrostomy tubes in place.  2. Urothelial bladder cancer. Discussed his case with his oncologist Dr. Alen Blew, all treatments are palliative, supportive care for now.  3. ARF. Due to #1 &  5. lan as in #1 above along with IV fluids, renal function improving gradually.  4. Hypertension. Currently blood pressure low. Hold blood pressure medications.  5. Diarrhea. Likely due to radiation exposure. Negative C. Difficile will start using Imodium when necessary. 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  6. Anemia and thrombocytopenia. Due to combination of his chemotherapy, sepsis, some element of dilution &  also had some blood loss in his nephrostomy tube in the ER on the left side,at this time heparin discontinued, no signs of ongoing bleeding, monitor CBC closely, type screen done. Anemia panel largely inconclusive with mixed results.   DVT prophylaxis:scd Code Status: dnr Family Communication: dw exwife who was in the room Disposition Plan: palliative consult.tbd   Consultants: palliative   Procedures: nephrostomy tube replaced   Antimicrobials: meropenem   Subjective: Resting in bed in nad Objective: Vitals:   01/19/17 0900 01/19/17 1000 01/19/17 1200 01/19/17 1400  BP: 131/70 123/66 140/77 104/73  Pulse: 65 (!) 57 75 64  Resp: 14 12 (!) 23 18  Temp:   97.6 F (36.4 C)   TempSrc:   Oral   SpO2: 98% 95% 94% 94%  Weight:      Height:        Intake/Output Summary (Last 24 hours) at 01/19/17 1634 Last data filed at 01/19/17 1455  Gross per 24 hour  Intake             1915 ml  Output             1330 ml  Net              585 ml  Filed Weights   01/16/17 2000 01/19/17 0402  Weight: 64 kg (141 lb 1.5 oz) 73.2 kg (161 lb 6 oz)    Examination:  General exam: Appears calm and comfortable  Respiratory system: Clear to auscultation. Respiratory effort normal. Cardiovascular system: S1 & S2 heard, RRR. No JVD, murmurs, rubs, gallops or clicks. No pedal edema. Gastrointestinal system: Abdomen is nondistended, soft and  nontender. No organomegaly or masses felt. Normal bowel sounds heard. Central nervous system: Alert and oriented. No focal neurological deficits. Extremities: Symmetric 5 x 5 power. Skin: No rashes, lesions or ulcers Psychiatry: Judgement and insight appear normal. Mood & affect appropriate.     Data Reviewed: I have personally reviewed following labs and imaging studies  CBC:  Recent Labs Lab 01/16/17 1219 01/16/17 1748 01/17/17 0427 01/18/17 0500 01/19/17 0359  WBC 8.4 9.2 7.1 4.1 6.0  HGB 16.4 10.3* 8.7* 8.0* 8.1*  HCT 47.8 31.4* 25.5* 23.2* 23.7*  MCV 90.0 91.5 90.1 89.6 88.4  PLT 111* 83* 67* 73* 87*   Basic Metabolic Panel:  Recent Labs Lab 01/16/17 1219 01/16/17 1748 01/17/17 0427 01/18/17 0500 01/19/17 0359  NA 136  --  136 137 137  K 3.7  --  3.4* 3.2* 3.4*  CL 102  --  109 110 109  CO2 21*  --  21* 21* 23  GLUCOSE 161*  --  116* 173* 162*  BUN 33*  --  29* 28* 31*  CREATININE 3.18* 2.95* 2.38* 1.66* 1.35*  CALCIUM 8.1*  --  7.3* 7.4* 7.9*  MG  --   --   --   --  1.7   GFR: Estimated Creatinine Clearance: 43.7 mL/min (A) (by C-G formula based on SCr of 1.35 mg/dL (H)). Liver Function Tests:  Recent Labs Lab 01/16/17 1219 01/17/17 0427 01/18/17 0500 01/19/17 0359  AST 26 21 44* 40  ALT 25 23 46 48  ALKPHOS 73 60 53 58  BILITOT 1.1 0.6 0.5 0.4  PROT 6.3* 4.7* 4.6* 4.7*  ALBUMIN 3.0* 2.1* 1.9* 1.9*    Recent Labs Lab 01/16/17 1219  LIPASE 17   No results for input(s): AMMONIA in the last 168 hours. Coagulation Profile:  Recent Labs Lab 01/16/17 1748  INR 1.34   Cardiac Enzymes: No results for input(s): CKTOTAL, CKMB, CKMBINDEX, TROPONINI in the last 168 hours. BNP (last 3 results) No results for input(s): PROBNP in the last 8760 hours. HbA1C: No results for input(s): HGBA1C in the last 72 hours. CBG: No results for input(s): GLUCAP in the last 168 hours. Lipid Profile: No results for input(s): CHOL, HDL, LDLCALC, TRIG, CHOLHDL,  LDLDIRECT in the last 72 hours. Thyroid Function Tests: No results for input(s): TSH, T4TOTAL, FREET4, T3FREE, THYROIDAB in the last 72 hours. Anemia Panel:  Recent Labs  01/17/17 1213  VITAMINB12 343  FOLATE 22.0  FERRITIN 631*  TIBC 136*  IRON 7*  RETICCTPCT 1.1   Sepsis Labs:  Recent Labs Lab 01/16/17 1231 01/16/17 1748 01/16/17 2007 01/17/17 0000 01/17/17 0427 01/18/17 0500  PROCALCITON  --  8.91  --   --  10.47 5.41  LATICACIDVEN 1.74  --  1.0 1.2  --   --     Recent Results (from the past 240 hour(s))  Blood Culture (routine x 2)     Status: Abnormal (Preliminary result)   Collection Time: 01/16/17  2:52 PM  Result Value Ref Range Status   Specimen Description BLOOD LEFT ANTECUBITAL  Final   Special Requests   Final  BOTTLES DRAWN AEROBIC AND ANAEROBIC Blood Culture adequate volume   Culture  Setup Time   Final    GRAM NEGATIVE RODS IN BOTH AEROBIC AND ANAEROBIC BOTTLES CRITICAL VALUE NOTED.  VALUE IS CONSISTENT WITH PREVIOUSLY REPORTED AND CALLED VALUE.    Culture (A)  Final    KLEBSIELLA PNEUMONIAE SUSCEPTIBILITIES TO FOLLOW Performed at [facility], [place] [address], [place], [place] [address]    Report Status PENDING  Incomplete  Blood Culture (routine x 2)     Status: Abnormal (Preliminary result)   Collection Time: 01/16/17  3:20 PM  Result Value Ref Range Status   Specimen Description BLOOD PORTA CATH  Final   Special Requests   Final    BOTTLES DRAWN AEROBIC AND ANAEROBIC Blood Culture adequate volume   Culture  Setup Time   Final    IN BOTH AEROBIC AND ANAEROBIC BOTTLES GRAM NEGATIVE RODS CRITICAL RESULT CALLED TO, READ BACK BY AND VERIFIED WITH: PHARMD N [place] [id number] MLM    Culture (A)  Final    KLEBSIELLA PNEUMONIAE REPEATING SUSCEPTIBILITIES FOR CONFIRMATION Performed at [facility], [place] [address] [place], [place] [address]    Report Status PENDING  Incomplete  Blood Culture ID Panel (Reflexed)     Status:  Abnormal   Collection Time: 01/16/17  3:20 PM  Result Value Ref Range Status   Enterococcus species NOT DETECTED NOT DETECTED Final   Listeria monocytogenes NOT DETECTED NOT DETECTED Final   Staphylococcus species NOT DETECTED NOT DETECTED Final   Staphylococcus aureus NOT DETECTED NOT DETECTED Final   Streptococcus species NOT DETECTED NOT DETECTED Final   Streptococcus agalactiae NOT DETECTED NOT DETECTED Final   Streptococcus pneumoniae NOT DETECTED NOT DETECTED Final   Streptococcus pyogenes NOT DETECTED NOT DETECTED Final   Acinetobacter baumannii NOT DETECTED NOT DETECTED Final   Enterobacteriaceae species DETECTED (A) NOT DETECTED Final    Comment: Enterobacteriaceae represent a large family of gram-negative bacteria, not a single organism. CRITICAL RESULT CALLED TO, READ BACK BY AND VERIFIED WITH: PHARMD N [place] [id number] MLM    Enterobacter cloacae complex NOT DETECTED NOT DETECTED Final   Escherichia coli NOT DETECTED NOT DETECTED Final   Klebsiella oxytoca NOT DETECTED NOT DETECTED Final   Klebsiella pneumoniae DETECTED (A) NOT DETECTED Final    Comment: CRITICAL RESULT CALLED TO, READ BACK BY AND VERIFIED WITH: PHARMD N GLOGOVAC [id number] MLM    Proteus species NOT DETECTED NOT DETECTED Final   Serratia marcescens NOT DETECTED NOT DETECTED Final   Carbapenem resistance NOT DETECTED NOT DETECTED Final   Haemophilus influenzae NOT DETECTED NOT DETECTED Final   Neisseria meningitidis NOT DETECTED NOT DETECTED Final   Pseudomonas aeruginosa NOT DETECTED NOT DETECTED Final   Candida albicans NOT DETECTED NOT DETECTED Final   Candida glabrata NOT DETECTED NOT DETECTED Final   Candida krusei NOT DETECTED NOT DETECTED Final   Candida parapsilosis NOT DETECTED NOT DETECTED Final   Candida tropicalis NOT DETECTED NOT DETECTED Final    Comment: Performed at [facility], [address], [place], [place] [address]  Urine Culture     Status: Abnormal   Collection  Time: 01/16/17  6:00 PM  Result Value Ref Range Status   Specimen Description URINE, RANDOM  Final   Special Requests Immunocompromised  Final   Culture 20,000 COLONIES/mL KLEBSIELLA PNEUMONIAE (A)  Final   Report Status 01/19/2017 FINAL  Final   Organism ID, Bacteria KLEBSIELLA PNEUMONIAE (A)  Final  Susceptibility   Klebsiella pneumoniae - MIC*    AMPICILLIN RESISTANT Resistant     CEFAZOLIN <=4 SENSITIVE Sensitive     CEFTRIAXONE <=1 SENSITIVE Sensitive     CIPROFLOXACIN <=0.25 SENSITIVE Sensitive     GENTAMICIN <=1 SENSITIVE Sensitive     IMIPENEM <=0.25 SENSITIVE Sensitive     NITROFURANTOIN 32 SENSITIVE Sensitive     TRIMETH/SULFA <=20 SENSITIVE Sensitive     AMPICILLIN/SULBACTAM 4 SENSITIVE Sensitive     PIP/TAZO <=4 SENSITIVE Sensitive     Extended ESBL NEGATIVE Sensitive     * 20,000 COLONIES/mL KLEBSIELLA PNEUMONIAE  MRSA PCR Screening     Status: None   Collection Time: 01/16/17  6:00 PM  Result Value Ref Range Status   MRSA by PCR NEGATIVE NEGATIVE Final    Comment:        The GeneXpert MRSA Assay (FDA approved for NASAL specimens only), is one component of a comprehensive MRSA colonization surveillance program. It is not intended to diagnose MRSA infection nor to guide or monitor treatment for MRSA infections.   C difficile quick scan w PCR reflex     Status: None   Collection Time: 01/16/17 11:36 PM  Result Value Ref Range Status   C Diff antigen NEGATIVE NEGATIVE Final   C Diff toxin NEGATIVE NEGATIVE Final   C Diff interpretation No C. difficile detected.  Final         Radiology Studies: No results found.      Scheduled Meds: . Chlorhexidine Gluconate Cloth  6 each Topical Daily  . feeding supplement  1 Container Oral TID BM  . hydrocortisone sod succinate (SOLU-CORTEF) inj  100 mg Intravenous Q8H  . [START ON 01/20/2017] Influenza vac split quadrivalent PF  0.5 mL Intramuscular Tomorrow-1000  . loperamide  2 mg Oral TID  .  multivitamin with minerals  1 tablet Oral Daily  . rOPINIRole  0.5 mg Oral QHS  . sodium chloride flush  10 mL Intravenous Q8H   Continuous Infusions: . meropenem (MERREM) IV Stopped (01/19/17 0706)  . phenylephrine (NEO-SYNEPHRINE) Adult infusion Stopped (01/17/17 0505)  . sodium chloride       LOS: 3 days      Georgette Shell, MD [facility]  If 7PM-7AM, please contact night-coverage [email or web address] Password [facility] 01/19/2017, 4:34 PM

## 2017-01-20 ENCOUNTER — Ambulatory Visit: Payer: Medicare Other

## 2017-01-20 DIAGNOSIS — Z515 Encounter for palliative care: Secondary | ICD-10-CM

## 2017-01-20 DIAGNOSIS — Z7189 Other specified counseling: Secondary | ICD-10-CM

## 2017-01-20 DIAGNOSIS — D494 Neoplasm of unspecified behavior of bladder: Secondary | ICD-10-CM

## 2017-01-20 LAB — COMPREHENSIVE METABOLIC PANEL
ALT: 41 U/L (ref 17–63)
AST: 27 U/L (ref 15–41)
Albumin: 1.9 g/dL — ABNORMAL LOW (ref 3.5–5.0)
Alkaline Phosphatase: 58 U/L (ref 38–126)
Anion gap: 7 (ref 5–15)
BUN: 33 mg/dL — ABNORMAL HIGH (ref 6–20)
CO2: 22 mmol/L (ref 22–32)
Calcium: 8 mg/dL — ABNORMAL LOW (ref 8.9–10.3)
Chloride: 110 mmol/L (ref 101–111)
Creatinine, Ser: 1.29 mg/dL — ABNORMAL HIGH (ref 0.61–1.24)
GFR calc Af Amer: 58 mL/min — ABNORMAL LOW (ref >60)
GFR calc non Af Amer: 50 mL/min — ABNORMAL LOW (ref >60)
Glucose, Bld: 142 mg/dL — ABNORMAL HIGH (ref 65–99)
Potassium: 3.4 mmol/L — ABNORMAL LOW (ref 3.5–5.1)
Sodium: 139 mmol/L (ref 135–145)
Total Bilirubin: 0.5 mg/dL (ref 0.3–1.2)
Total Protein: 4.7 g/dL — ABNORMAL LOW (ref 6.5–8.1)

## 2017-01-20 LAB — CBC
HCT: 24.9 % — ABNORMAL LOW (ref 39.0–52.0)
Hemoglobin: 8.5 g/dL — ABNORMAL LOW (ref 13.0–17.0)
MCH: 30 pg (ref 26.0–34.0)
MCHC: 34.1 g/dL (ref 30.0–36.0)
MCV: 88 fL (ref 78.0–100.0)
Platelets: 113 10*3/uL — ABNORMAL LOW (ref 150–400)
RBC: 2.83 MIL/uL — ABNORMAL LOW (ref 4.22–5.81)
RDW: 15.1 % (ref 11.5–15.5)
WBC: 6.2 10*3/uL (ref 4.0–10.5)

## 2017-01-20 LAB — CULTURE, BLOOD (ROUTINE X 2)
SPECIAL REQUESTS: ADEQUATE
Special Requests: ADEQUATE

## 2017-01-20 NOTE — Progress Notes (Signed)
Date:  January 20, 2017 Chart reviewed for concurrent status and case management needs.  Will continue to follow patient progress.  Discharge Planning: following for needs  Expected discharge date: January 23, 2017  Velva Harman, BSN, [place], [place]

## 2017-01-20 NOTE — Progress Notes (Signed)
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 Alen Blew status post 3 chemotherapy cycles and 15 radiation cycles all palliative, essential hypertension, dyslipidemia who lives at home, comes in with a few day history of lower abdominal discomfort and mild diarrhea, today he noted that his left nephrostomy tube had bloody drainage, came to the ER where workup showed sepsis, fever, ARF CT evidence of left-sided hydronephrosis and partially dislodged left nephrostomy tube. patient seen today.he is resting in bed.hehas seen  palliative care,and did not want to continue radiation.  Rectal tube empty.no diarrhea overnite.   Assessment & Plan:   Principal Problem:   Sepsis due to urinary tract infection ([place]) Active Problems:   Hyperlipidemia   Essential hypertension   Acute renal failure (HCC)   Hydronephrosis   Bladder tumor   Pressure injury of skin   Advance care planning   Palliative care by specialist  Sepsis due to partially dislodged left nephrostomy tube with hydronephrosis and gram-negative bacteremia and sepsis. Continue monitoring andstepdown, received IV bolus and continues to receive IV fluids for blood pressure support, he initially required IV pressors however after stress dose steroids were added pressors have been titrated off successfully, blood pressures have stabilized, sepsis physiology seems to be resolving, continue IV meropenem, follow final blood culture report, pro-calcitonin lactate much improved. IR has already replaced the dislodged left nephrostomy tube on the day of admission.b/l nephrostomy tubes in place.  2. Urothelial bladder cancer. Discussed his case with his  oncologist Dr. Alen Blew, all treatments are palliative, supportive care for now.  3. ARF. Due to #1 &5. lan as in #1 above along with IV fluids, renal function improving gradually.  4. Hypertension. Currently blood pressure low. Hold blood pressure medications.  5. Diarrhea. Likely due to radiation exposure. Negative C. Difficile will start using Imodium when necessary. 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  6. Anemia and thrombocytopenia. Due to combination of hischemotherapy,sepsis,some element of dilution &also had some blood loss in his nephrostomy tube in the ERon the left side,at this time heparin discontinued, no signs of ongoing bleeding, monitor CBC closely, type screen done. Anemia panel largely inconclusive with mixed results.   Plan dc rectal tube . DVT prophylaxis: scd Code Status: dnr Family Communication: exwife Disposition Plan: hope to dc soon home with hospice.   Consultants  Procedures:   Antimicrobials:  meropenem  Subjective  Objective: Vitals:   01/20/17 0900 01/20/17 1000 01/20/17 1100 01/20/17 1200  BP:  134/65  128/62  Pulse: (!) 52 (!) 49 (!) 53 (!) 58  Resp: 13 14 16 13   Temp:    97.8 F (36.6 C)  TempSrc:    Oral  SpO2: 96% 95% 94% 95%  Weight:      Height:        Intake/Output Summary (Last 24 hours) at 01/20/17 1454 Last data filed at 01/20/17 1200  Gross per 24 hour  Intake             1380 ml  Output              600 ml  Net  780 ml   Filed Weights   01/16/17 2000 01/19/17 0402 01/20/17 0406  Weight: 64 kg (141 lb 1.5 oz) 73.2 kg (161 lb 6 oz) 74.1 kg (163 lb 5.8 oz)    Examination:  General exam: Appears calm and comfortable  Respiratory system: Clear to auscultation. Respiratory effort normal. Cardiovascular system: S1 & S2 heard, RRR. No JVD, murmurs, rubs, gallops or  clicks. No pedal edema. Gastrointestinal system: Abdomen is nondistended, soft and nontender. No organomegaly or masses felt. Normal bowel sounds heard. Central nervous system: Alert and oriented. No focal neurological deficits. Extremities: Symmetric 5 x 5 power. Skin: No rashes, lesions or ulcers Psychiatry: Judgement and insight appear normal. Mood & affect appropriate.     Data Reviewed: I have personally reviewed following labs and imaging studies  CBC:  Recent Labs Lab 01/16/17 1748 01/17/17 0427 01/18/17 0500 01/19/17 0359 01/20/17 0403  WBC 9.2 7.1 4.1 6.0 6.2  HGB 10.3* 8.7* 8.0* 8.1* 8.5*  HCT 31.4* 25.5* 23.2* 23.7* 24.9*  MCV 91.5 90.1 89.6 88.4 88.0  PLT 83* 67* 73* 87* 371*   Basic Metabolic Panel:  Recent Labs Lab 01/16/17 1219 01/16/17 1748 01/17/17 0427 01/18/17 0500 01/19/17 0359 01/20/17 0403  NA 136  --  136 137 137 139  K 3.7  --  3.4* 3.2* 3.4* 3.4*  CL 102  --  109 110 109 110  CO2 21*  --  21* 21* 23 22  GLUCOSE 161*  --  116* 173* 162* 142*  BUN 33*  --  29* 28* 31* 33*  CREATININE 3.18* 2.95* 2.38* 1.66* 1.35* 1.29*  CALCIUM 8.1*  --  7.3* 7.4* 7.9* 8.0*  MG  --   --   --   --  1.7  --    GFR: Estimated Creatinine Clearance: 46.3 mL/min (A) (by C-G formula based on SCr of 1.29 mg/dL (H)). Liver Function Tests:  Recent Labs Lab 01/16/17 1219 01/17/17 0427 01/18/17 0500 01/19/17 0359 01/20/17 0403  AST 26 21 44* 40 27  ALT 25 23 46 48 41  ALKPHOS 73 60 53 58 58  BILITOT 1.1 0.6 0.5 0.4 0.5  PROT 6.3* 4.7* 4.6* 4.7* 4.7*  ALBUMIN 3.0* 2.1* 1.9* 1.9* 1.9*    Recent Labs Lab 01/16/17 1219  LIPASE 17   No results for input(s): AMMONIA in the last 168 hours. Coagulation Profile:  Recent Labs Lab 01/16/17 1748  INR 1.34   Cardiac Enzymes: No results for input(s): CKTOTAL, CKMB, CKMBINDEX, TROPONINI in the last 168 hours. BNP (last 3 results) No results for input(s): PROBNP in the last 8760 hours. HbA1C: No results  for input(s): HGBA1C in the last 72 hours. CBG: No results for input(s): GLUCAP in the last 168 hours. Lipid Profile: No results for input(s): CHOL, HDL, LDLCALC, TRIG, CHOLHDL, LDLDIRECT in the last 72 hours. Thyroid Function Tests: No results for input(s): TSH, T4TOTAL, FREET4, T3FREE, THYROIDAB in the last 72 hours. Anemia Panel: No results for input(s): VITAMINB12, FOLATE, FERRITIN, TIBC, IRON, RETICCTPCT in the last 72 hours. Sepsis Labs:  Recent Labs Lab 01/16/17 1231 01/16/17 1748 01/16/17 2007 01/17/17 0000 01/17/17 0427 01/18/17 0500  PROCALCITON  --  8.91  --   --  10.47 5.41  LATICACIDVEN 1.74  --  1.0 1.2  --   --     Recent Results (from the past 240 hour(s))  Blood Culture (routine x 2)     Status: Abnormal   Collection Time: 01/16/17  2:52 PM  Result Value  Ref Range Status   Specimen Description BLOOD LEFT ANTECUBITAL  Final   Special Requests   Final    BOTTLES DRAWN AEROBIC AND ANAEROBIC Blood Culture adequate volume   Culture  Setup Time   Final    GRAM NEGATIVE RODS IN BOTH AEROBIC AND ANAEROBIC BOTTLES CRITICAL VALUE NOTED.  VALUE IS CONSISTENT WITH PREVIOUSLY REPORTED AND CALLED VALUE.    Culture (A)  Final    KLEBSIELLA PNEUMONIAE SUSCEPTIBILITIES PERFORMED ON PREVIOUS CULTURE WITHIN THE LAST 5 DAYS. Performed at [facility], [place] [address], [place], [place] [address]    Report Status 01/20/2017 FINAL  Final  Blood Culture (routine x 2)     Status: Abnormal   Collection Time: 01/16/17  3:20 PM  Result Value Ref Range Status   Specimen Description BLOOD PORTA CATH  Final   Special Requests   Final    BOTTLES DRAWN AEROBIC AND ANAEROBIC Blood Culture adequate volume   Culture  Setup Time   Final    IN BOTH AEROBIC AND ANAEROBIC BOTTLES GRAM NEGATIVE RODS CRITICAL RESULT CALLED TO, READ BACK BY AND VERIFIED WITH: Nani Skillern [id number] MLM Performed at [facility], [place] [address] [place], [place] [address]    Culture  KLEBSIELLA PNEUMONIAE (A)  Final   Report Status 01/20/2017 FINAL  Final   Organism ID, Bacteria KLEBSIELLA PNEUMONIAE  Final      Susceptibility   Klebsiella pneumoniae - MIC*    AMPICILLIN RESISTANT Resistant     CEFAZOLIN <=4 SENSITIVE Sensitive     CEFEPIME <=1 SENSITIVE Sensitive     CEFTAZIDIME <=1 SENSITIVE Sensitive     CEFTRIAXONE <=1 SENSITIVE Sensitive     CIPROFLOXACIN <=0.25 SENSITIVE Sensitive     GENTAMICIN <=1 SENSITIVE Sensitive     IMIPENEM <=0.25 SENSITIVE Sensitive     TRIMETH/SULFA <=20 SENSITIVE Sensitive     AMPICILLIN/SULBACTAM 4 SENSITIVE Sensitive     PIP/TAZO <=4 SENSITIVE Sensitive     Extended ESBL NEGATIVE Sensitive     * KLEBSIELLA PNEUMONIAE  Blood Culture ID Panel (Reflexed)     Status: Abnormal   Collection Time: 01/16/17  3:20 PM  Result Value Ref Range Status   Enterococcus species NOT DETECTED NOT DETECTED Final   Listeria monocytogenes NOT DETECTED NOT DETECTED Final   Staphylococcus species NOT DETECTED NOT DETECTED Final   Staphylococcus aureus NOT DETECTED NOT DETECTED Final   Streptococcus species NOT DETECTED NOT DETECTED Final   Streptococcus agalactiae NOT DETECTED NOT DETECTED Final   Streptococcus pneumoniae NOT DETECTED NOT DETECTED Final   Streptococcus pyogenes NOT DETECTED NOT DETECTED Final   Acinetobacter baumannii NOT DETECTED NOT DETECTED Final   Enterobacteriaceae species DETECTED (A) NOT DETECTED Final    Comment: Enterobacteriaceae represent a large family of gram-negative bacteria, not a single organism. CRITICAL RESULT CALLED TO, READ BACK BY AND VERIFIED WITH: PHARMD N GLOGOVAC [id number] MLM    Enterobacter cloacae complex NOT DETECTED NOT DETECTED Final   Escherichia coli NOT DETECTED NOT DETECTED Final   Klebsiella oxytoca NOT DETECTED NOT DETECTED Final   Klebsiella pneumoniae DETECTED (A) NOT DETECTED Final    Comment: CRITICAL RESULT CALLED TO, READ BACK BY AND VERIFIED WITH: PHARMD N GLOGOVAC [id number]  MLM    Proteus species NOT DETECTED NOT DETECTED Final   Serratia marcescens NOT DETECTED NOT DETECTED Final   Carbapenem resistance NOT DETECTED NOT DETECTED Final   Haemophilus influenzae NOT DETECTED NOT DETECTED Final   Neisseria  meningitidis NOT DETECTED NOT DETECTED Final   Pseudomonas aeruginosa NOT DETECTED NOT DETECTED Final   Candida albicans NOT DETECTED NOT DETECTED Final   Candida glabrata NOT DETECTED NOT DETECTED Final   Candida krusei NOT DETECTED NOT DETECTED Final   Candida parapsilosis NOT DETECTED NOT DETECTED Final   Candida tropicalis NOT DETECTED NOT DETECTED Final    Comment: Performed at [facility], [place] [address] [place], [place] [address]  Urine Culture     Status: Abnormal   Collection Time: 01/16/17  6:00 PM  Result Value Ref Range Status   Specimen Description URINE, RANDOM  Final   Special Requests Immunocompromised  Final   Culture 20,000 COLONIES/mL KLEBSIELLA PNEUMONIAE (A)  Final   Report Status 01/19/2017 FINAL  Final   Organism ID, Bacteria KLEBSIELLA PNEUMONIAE (A)  Final      Susceptibility   Klebsiella pneumoniae - MIC*    AMPICILLIN RESISTANT Resistant     CEFAZOLIN <=4 SENSITIVE Sensitive     CEFTRIAXONE <=1 SENSITIVE Sensitive     CIPROFLOXACIN <=0.25 SENSITIVE Sensitive     GENTAMICIN <=1 SENSITIVE Sensitive     IMIPENEM <=0.25 SENSITIVE Sensitive     NITROFURANTOIN 32 SENSITIVE Sensitive     TRIMETH/SULFA <=20 SENSITIVE Sensitive     AMPICILLIN/SULBACTAM 4 SENSITIVE Sensitive     PIP/TAZO <=4 SENSITIVE Sensitive     Extended ESBL NEGATIVE Sensitive     * 20,000 COLONIES/mL KLEBSIELLA PNEUMONIAE  MRSA PCR Screening     Status: None   Collection Time: 01/16/17  6:00 PM  Result Value Ref Range Status   MRSA by PCR NEGATIVE NEGATIVE Final    Comment:        The GeneXpert MRSA Assay (FDA approved for NASAL specimens only), is one component of a comprehensive MRSA colonization surveillance program. It is not intended to  diagnose MRSA infection nor to guide or monitor treatment for MRSA infections.   C difficile quick scan w PCR reflex     Status: None   Collection Time: 01/16/17 11:36 PM  Result Value Ref Range Status   C Diff antigen NEGATIVE NEGATIVE Final   C Diff toxin NEGATIVE NEGATIVE Final   C Diff interpretation No C. difficile detected.  Final         Radiology Studies: No results found.      Scheduled Meds: . Chlorhexidine Gluconate Cloth  6 each Topical Daily  . feeding supplement  1 Container Oral TID BM  . hydrocortisone sod succinate (SOLU-CORTEF) inj  100 mg Intravenous Q8H  . loperamide  2 mg Oral TID  . multivitamin with minerals  1 tablet Oral Daily  . rOPINIRole  0.5 mg Oral QHS  . sodium chloride flush  10 mL Intravenous Q8H   Continuous Infusions: . meropenem (MERREM) IV Stopped (01/20/17 0755)  . phenylephrine (NEO-SYNEPHRINE) Adult infusion Stopped (01/17/17 0505)  . sodium chloride       LOS: 4 days     Georgette Shell, MD [facility]  If 7PM-7AM, please contact night-coverage [email or web address] Password [email or web address] 01/20/2017, 2:54 PM

## 2017-01-20 NOTE — Consult Note (Signed)
Consultation Note Date: 01/20/2017   Patient Name: Randall Little  DOB: [birth year]  MRN: [id number]  Age / Sex: 83 y.o., male  PCP: Leonard Downing, MD Referring Physician: Georgette Shell, MD  Reason for Consultation: Establishing goals of care  HPI/Patient Profile: 82 y.o. male  with past medical history of high grade urothelial carcinoma dx July 2018 s/p 3 palliative chemotherapy (Carboplatin) treatments and 15 radiation treatments  admitted on 01/16/2017 with abdominal pain, weakness, and hematuria. Workup revealed sepsis, dislodged nephrostomy tube. He required IV pressors, but these have been weaned and BP has stabilized, sepsis physiology is resolving. Blood cultures positive for enterobacteriacae species and Klebsiella oxytoca. He has continued to have diarrhea, but this is also improving- etiology is felt to be resulting from radiation treatments.  He has expressed desire to stop chemotherapy and radiation treatments. Palliative medicine consulted for [place].   Clinical Assessment and Goals of Care:  I have reviewed medical records including EPIC notes, labs and imaging, assessed the patient and then met at the bedside along with the patient and his spouse to discuss diagnosis prognosis, GOC, EOL wishes, disposition and options.  I introduced Palliative Medicine as specialized medical care for people living with serious illness. It focuses on providing relief from the symptoms and stress of a serious illness. The goal is to improve quality of life for both the patient and the family.  We discussed a brief life review of the patient. 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 He and his wife live together in [place].   As far as functional and nutritional status- he has been doing ok. He would have good and bad days  based on his treatment days.     We discussed their current illness and what it means in the larger context of their on-going co-morbidities.  Natural disease trajectory and expectations at EOL were discussed.   Patient and his wife are very clear that their primary goals are to stabilize patient during this hospitalization so that he can be discharged home to "have some quality time" before he dies from his cancer. He does not want any more chemotherapy or radiation therapy treatments. They feel that his quality of life has suffered with these treatments and they wish to instead focus on having the best quality for what time he has left.   Hospice and Palliative Care services outpatient were explained and offered. They request to be discharged home with Hospice services. Patient desires a natural death in the home.   Questions and concerns were addressed. The family was encouraged to call with questions or concerns.    Primary Decision Maker PATIENT    SUMMARY OF RECOMMENDATIONS -No further chemotherapy -No further radiation treatments -Maximize medically, then discharge home with Hospice services in the home -Case manager referral for Hospice choice -PT for safety and ambulation     Code Status/Advance Care Planning:  DNR  Prognosis:    <  6 months due to aggressive cancer with no further plans for treatment  Discharge Planning: Home with Hospice  Primary Diagnoses: Present on Admission: . Sepsis due to urinary tract infection ([place]) . Acute renal failure ([place]) . Bladder tumor . Essential hypertension . Hydronephrosis . Hyperlipidemia   I have reviewed the medical record, interviewed the patient and family, and examined the patient. The following aspects are pertinent.  Past Medical History:  Diagnosis Date  . Arrhythmia    Tachycardia  . Bladder cancer ([place])   . Dyslipidemia   . Dysrhythmia   . History of kidney stones   . Skin cancer    removed from face  . SOB  (shortness of breath)    Social History   Social History  . Marital status: Married    Spouse name: N/A  . Number of children: N/A  . Years of education: N/A   Social History Main Topics  . Smoking status: Former Smoker    Packs/day: 0.25    Years: 15.00    Types: Cigarettes    Quit date: 04/05/1966  . Smokeless tobacco: Never Used     Comment: 50 years ago  . Alcohol use No  . Drug use: No  . Sexual activity: Yes   Other Topics Concern  . None   Social History Narrative  . None   Family History  Problem Relation Age of Onset  . Heart disease Mother   . Cancer Sister   . Cancer Sister    Scheduled Meds: . Chlorhexidine Gluconate Cloth  6 each Topical Daily  . feeding supplement  1 Container Oral TID BM  . hydrocortisone sod succinate (SOLU-CORTEF) inj  100 mg Intravenous Q8H  . Influenza vac split quadrivalent PF  0.5 mL Intramuscular Tomorrow-1000  . loperamide  2 mg Oral TID  . multivitamin with minerals  1 tablet Oral Daily  . rOPINIRole  0.5 mg Oral QHS  . sodium chloride flush  10 mL Intravenous Q8H   Continuous Infusions: . meropenem (MERREM) IV Stopped (01/20/17 0755)  . phenylephrine (NEO-SYNEPHRINE) Adult infusion Stopped (01/17/17 0505)  . sodium chloride     PRN Meds:.acetaminophen, albuterol, diphenhydrAMINE, fentaNYL (SUBLIMAZE) injection, haloperidol lactate, iopamidol, loperamide, ondansetron (ZOFRAN) IV, prochlorperazine, sodium chloride, sodium chloride flush Medications Prior to Admission:  Prior to Admission medications   Medication Sig Start Date End Date Taking? Authorizing Provider  loperamide (IMODIUM) 2 MG capsule Take 2 mg by mouth as needed for diarrhea or loose stools.   Yes [provider]  metoprolol succinate (TOPROL-XL) 50 MG 24 hr tablet TAKE 1 TABLET BY MOUTH ONCE DAILY .  TAKE  WITH  OR  IMMEDIATELY  FOLLOWING  A  MEAL 11/09/16  Yes Nahser, Wonda Cheng, MD  ondansetron (ZOFRAN) 4 MG tablet Take 1 tablet (4 mg total) by mouth  every 8 (eight) hours as needed for nausea or vomiting. 01/11/17  Yes Wyatt Portela, MD  oxyCODONE (OXY IR/ROXICODONE) 5 MG immediate release tablet Take 1 tablet (5 mg total) by mouth every 4 (four) hours as needed for moderate pain. 01/11/17  Yes Wyatt Portela, MD  prochlorperazine (COMPAZINE) 10 MG tablet Take 1 tablet (10 mg total) by mouth every 6 (six) hours as needed for nausea or vomiting. 12/21/16  Yes Wyatt Portela, MD  zolpidem (AMBIEN) 5 MG tablet Take 5 mg by mouth at bedtime.  12/04/16  Yes [provider]   Allergies  Allergen Reactions  . Ativan [Lorazepam]  delirium  . Penicillin G Rash    Has patient had a PCN reaction causing immediate rash, facial/tongue/throat swelling, SOB or lightheadedness with hypotension: Yes Has patient had a PCN reaction causing severe rash involving mucus membranes or skin necrosis: No Has patient had a PCN reaction that required hospitalization: No Has patient had a PCN reaction occurring within the last 10 years: Yes If all of the above answers are "NO", then may proceed with Cephalosporin use.   Review of Systems  Constitutional: Positive for activity change and appetite change.  Respiratory: Negative.   Cardiovascular: Negative.   Gastrointestinal: Positive for diarrhea.  Psychiatric/Behavioral: Negative for sleep disturbance. The patient is not nervous/anxious.     Physical Exam  Constitutional:  frail  HENT:  Head: Normocephalic and atraumatic.  Cardiovascular: Normal rate and regular rhythm.   Pulmonary/Chest: Effort normal and breath sounds normal.  Skin: Skin is warm and dry.  Nursing note and vitals reviewed.   Vital Signs: BP 132/69 (BP Location: Left Arm)   Pulse (!) 52   Temp (!) 97.5 F (36.4 C) (Oral)   Resp 16   Ht '5\' 11"'$  (1.803 m)   Wt 74.1 kg (163 lb 5.8 oz)   SpO2 92%   BMI 22.78 kg/m  Pain Assessment: No/denies pain   Pain Score: 0-No pain   SpO2: SpO2: 92 % O2 Device:SpO2: 92 % O2 Flow  Rate: .O2 Flow Rate (L/min): 2 L/min  IO: Intake/output summary:  Intake/Output Summary (Last 24 hours) at 01/20/17 1238 Last data filed at 01/20/17 0530  Gross per 24 hour  Intake             1130 ml  Output              525 ml  Net              605 ml    LBM: Last BM Date: 01/19/17 Baseline Weight: Weight: 64 kg (141 lb 1.5 oz) Most recent weight: Weight: 74.1 kg (163 lb 5.8 oz)     Palliative Assessment/Data: PPS: 20%     Thank you for this consult. Palliative medicine will continue to follow and assist as needed.   Time In: 1130 Time Out: 1300 Time Total: 90 minutes Greater than 50%  of this time was spent counseling and coordinating care related to the above assessment and plan.  Signed by: Mariana Kaufman, AGNP-C Palliative Medicine    Please contact Palliative Medicine Team phone at [phone] for questions and concerns.  For individual provider: See Shea Evans

## 2017-01-20 NOTE — Progress Notes (Signed)
Spoke with Randall Low, RN caring for patient today in the ICU. She reports that the patient is refusing radiation therapy today. Informed Trudee Kuster, RT on L3 of this finding. Also, informed Ashlyn Bruning, PA-C of this finding. Radiation cancelled for today.

## 2017-01-20 NOTE — Progress Notes (Signed)
Events in the last few days noted. Patient appears to be clinically improving.he still report some diarrhea at this time. He is planning to stop radiation given the side effects he is experiencing. I appreciate the input from palliative medicine service and a family meeting is scheduled for today.  I support his decision to discontinue treatment and I favor supportive care only and hospice enrollment upon discharge. I think he is a poor candidate for any aggressive therapy moving forward.

## 2017-01-21 ENCOUNTER — Ambulatory Visit: Payer: Medicare Other

## 2017-01-21 LAB — CBC
HCT: 22.5 % — ABNORMAL LOW (ref 39.0–52.0)
Hemoglobin: 7.6 g/dL — ABNORMAL LOW (ref 13.0–17.0)
MCH: 30.2 pg (ref 26.0–34.0)
MCHC: 33.8 g/dL (ref 30.0–36.0)
MCV: 89.3 fL (ref 78.0–100.0)
PLATELETS: 91 10*3/uL — AB (ref 150–400)
RBC: 2.52 MIL/uL — AB (ref 4.22–5.81)
RDW: 15.5 % (ref 11.5–15.5)
WBC: 5.3 10*3/uL (ref 4.0–10.5)

## 2017-01-21 LAB — COMPREHENSIVE METABOLIC PANEL
ALBUMIN: 1.8 g/dL — AB (ref 3.5–5.0)
ALT: 54 U/L (ref 17–63)
ANION GAP: 6 (ref 5–15)
AST: 38 U/L (ref 15–41)
Alkaline Phosphatase: 48 U/L (ref 38–126)
BUN: 33 mg/dL — ABNORMAL HIGH (ref 6–20)
CHLORIDE: 112 mmol/L — AB (ref 101–111)
CO2: 23 mmol/L (ref 22–32)
CREATININE: 1.13 mg/dL (ref 0.61–1.24)
Calcium: 7.3 mg/dL — ABNORMAL LOW (ref 8.9–10.3)
GFR calc non Af Amer: 59 mL/min — ABNORMAL LOW (ref >60)
Glucose, Bld: 132 mg/dL — ABNORMAL HIGH (ref 65–99)
Potassium: 3.1 mmol/L — ABNORMAL LOW (ref 3.5–5.1)
SODIUM: 141 mmol/L (ref 135–145)
Total Bilirubin: 0.7 mg/dL (ref 0.3–1.2)
Total Protein: 4.2 g/dL — ABNORMAL LOW (ref 6.5–8.1)

## 2017-01-21 MED ORDER — ROPINIROLE HCL 1 MG PO TABS
0.5000 mg | ORAL_TABLET | Freq: Once | ORAL | Status: DC
  Filled 2017-01-21: qty 1

## 2017-01-21 MED ORDER — CIPROFLOXACIN HCL 500 MG PO TABS
500.0000 mg | ORAL_TABLET | Freq: Two times a day (BID) | ORAL | Status: DC
  Administered 2017-01-21 – 2017-01-24 (×6): 500 mg via ORAL
  Filled 2017-01-21 (×6): qty 1

## 2017-01-21 MED ORDER — SODIUM CHLORIDE 0.9 % IV SOLN: 1.0000 g | Freq: Three times a day (TID) | INTRAVENOUS | Status: DC

## 2017-01-21 MED ORDER — POTASSIUM CHLORIDE CRYS ER 20 MEQ PO TBCR
40.0000 meq | EXTENDED_RELEASE_TABLET | Freq: Once | ORAL | Status: AC
  Administered 2017-01-21: 40 meq via ORAL
  Filled 2017-01-21: qty 2

## 2017-01-21 NOTE — Evaluation (Signed)
Physical Therapy Evaluation Patient Details Name: Randall Little MRN: [id number] DOB: 11-Feb-1933 Today's Date: 01/21/2017   History of Present Illness  83 y.o. 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 Alen Blew status post 3 chemotherapy cycles and 15 radiation cycles all palliative, essential hypertension, dyslipidemia who lives at home, comes in with a few day history of lower abdominal discomfort and mild diarrhea, today he noted that his left nephrostomy tube had bloody drainage, came to the ER where workup showed sepsis, fever, ARF CT evidence of left-sided hydronephrosis and partially dislodged left nephrostomy tube.  Clinical Impression  Pt ambulated 400' with RW with no loss of balance, HR 114 walking. He does not require physical assistance for mobility. No further PT indicated. Encouraged pt to walk in halls TID to prevent deconditioning. PT signing off.      Follow Up Recommendations No PT follow up    Equipment Recommendations  None recommended by PT    Recommendations for Other Services       Precautions / Restrictions Precautions Precautions: None Precaution Comments: no falls in past 1 year Restrictions Weight Bearing Restrictions: No      Mobility  Bed Mobility Overal bed mobility: Modified Independent             General bed mobility comments: with rail  Transfers Overall transfer level: Needs assistance Equipment used: Rolling walker (2 wheeled) Transfers: Sit to/from Stand Sit to Stand: Supervision         General transfer comment: VCs hand placement  Ambulation/Gait Ambulation/Gait assistance: Supervision Ambulation Distance (Feet): 400 Feet Assistive device: Rolling walker (2 wheeled) Gait Pattern/deviations: WFL(Within Functional Limits)   Gait velocity interpretation: at or above  normal speed for age/gender General Gait Details: steady with RW, HR 114 walking  Stairs            Wheelchair Mobility    Modified Rankin (Stroke Patients Only)       Balance Overall balance assessment: Modified Independent                                           Pertinent Vitals/Pain Pain Assessment: No/denies pain    Home Living Family/patient expects to be discharged to:: Private residence Living Arrangements: Spouse/significant other Available Help at Discharge: Family;Available 24 hours/day Type of Home: House Home Access: Stairs to enter   [employer] of Steps: 1 Home Layout: One level Home Equipment: Walker - 2 wheels;Cane - single point      Prior Function Level of Independence: Independent         Comments: very active PTA, likes to go shag dancing     Hand Dominance        Extremity/Trunk Assessment   Upper Extremity Assessment Upper Extremity Assessment: Overall WFL for tasks assessed    Lower Extremity Assessment Lower Extremity Assessment: Overall WFL for tasks assessed    Cervical / Trunk Assessment Cervical / Trunk Assessment: Normal  Communication   Communication: No difficulties  Cognition Arousal/Alertness: Awake/alert Behavior During Therapy: WFL for tasks assessed/performed Overall Cognitive Status: Within Functional Limits for tasks assessed  General Comments      Exercises     Assessment/Plan    PT Assessment Patent does not need any further PT services  PT Problem List         PT Treatment Interventions      PT Goals (Current goals can be found in the Care Plan section)  Acute Rehab PT Goals Patient Stated Goal: home with Hospice PT Goal Formulation: All assessment and education complete, DC therapy    Frequency     Barriers to discharge        Co-evaluation               AM-PAC PT "6 Clicks" Daily  Activity  Outcome Measure Difficulty turning over in bed (including adjusting bedclothes, sheets and blankets)?: None Difficulty moving from lying on back to sitting on the side of the bed? : A Little Difficulty sitting down on and standing up from a chair with arms (e.g., wheelchair, bedside commode, etc,.)?: None Help needed moving to and from a bed to chair (including a wheelchair)?: None Help needed walking in hospital room?: None Help needed climbing 3-5 steps with a railing? : A Little 6 Click Score: 22    End of Session Equipment Utilized During Treatment: Gait belt Activity Tolerance: Patient tolerated treatment well Patient left: in chair;with call bell/phone within reach Nurse Communication: Mobility status      Time: 1561-5379 PT Time Calculation (min) (ACUTE ONLY): 28 min   Charges:   PT Evaluation $PT Eval Low Complexity: 1 Low PT Treatments $Gait Training: 8-22 mins   PT G Codes:          Philomena Doheny 01/21/2017, 12:37 PM [phone]

## 2017-01-21 NOTE — Progress Notes (Signed)
PROGRESS NOTE    Randall Little  JEH:[id number] DOB: Sep 06, 1932 DOA: 01/16/2017 PCP: Leonard Downing, MD    Brief Narrative:83 y.o.male,With history of urothelial bladder carcinoma causing bilateral hydronephrosis acquiring bilateral nephrostomy tube placement by IR, undergoing chemotherapy and radiation under the care of Dr. Alen Blew status post 3 chemotherapy cycles and 15 radiation cycles all palliative, essential hypertension, dyslipidemia who lives at home, comes in with a few day history of lower abdominal discomfort and mild diarrhea, today he noted that his left nephrostomy tube had bloody drainage, came to the ER where workup showed sepsis, fever, ARF CT evidence of left-sided hydronephrosis and partially dislodged left nephrostomy tube. patient seen today.he is resting in bed.hehas seen  palliative care,and did not want to continue radiation.  Rectal tube removed.no diarrhea overnite. Patient much more alert and talking.  Plan go home with hospice.   Assessment & Plan:   Principal Problem:   Sepsis due to urinary tract infection ([facility]) Active Problems:   Hyperlipidemia   Essential hypertension   Acute renal failure (H[facility])   Hydronephrosis   Bladder tumor   Pressure injury of skin   Advance care planning   Palliative care by specialist 1-Sepsis due to partially dislodged left nephrostomy tube with hydronephrosis and gram-negative bacteremia and sepsis. nephrostomy tubes in place.  Patient has klebsiella bacteremia resistant only to ampicillin.  Will DC meropenem.  Start ciprofloxacin twice a day.  2. Urothelial bladder cancer. Discussed his case with his oncologist Dr. Alen Blew, all treatments are palliative, supportive care for now.  3. ARF. Due to #1 &5. lan as in #1 above along with IV fluids, renal function improving gradually.  4. Hypertension. Currently blood pressure low. Hold blood pressure medications.  5. Diarrhea. Likely due to radiation exposure.  Negative C. Difficile will start using Imodium when necessary.  Rectal tube DC'd yesterday.  DVT prophylaxis:scd Code Status: dnr Family Communication: exwife Disposition Plan: Consult case management for home health.  Plan is to discharge patient home with hospice.  Consultants:  onc  Procedures: replaced nephrostomy tubes  Antimicrobials: cipro   Subjective: Denies any specific complaints today  Objective: Vitals:   01/21/17 0300 01/21/17 0346 01/21/17 0400 01/21/17 0800  BP:   111/64   Pulse: (!) 48  (!) 49   Resp: 13  13   Temp:  97.7 F (36.5 C)  98.4 F (36.9 C)  TempSrc:  Axillary  Oral  SpO2: 93%  91%   Weight:  74 kg (163 lb 2.3 oz)    Height:        Intake/Output Summary (Last 24 hours) at 01/21/17 1150 Last data filed at 01/21/17 0800  Gross per 24 hour  Intake              320 ml  Output              925 ml  Net             -605 ml   Filed Weights   01/19/17 0402 01/20/17 0406 01/21/17 0346  Weight: 73.2 kg (161 lb 6 oz) 74.1 kg (163 lb 5.8 oz) 74 kg (163 lb 2.3 oz)    Examination:  General exam: Appears calm and comfortable  Respiratory system: Clear to auscultation. Respiratory effort normal. Cardiovascular system: S1 & S2 heard, RRR. No JVD, murmurs, rubs, gallops or clicks. No pedal edema. Gastrointestinal system: Abdomen is nondistended, soft and nontender. No organomegaly or masses felt. Normal bowel sounds heard. Central nervous system:  Alert and oriented. No focal neurological deficits. Extremities: Symmetric 5 x 5 power. Skin: No rashes, lesions or ulcers Psychiatry: Judgement and insight appear normal. Mood & affect appropriate.     Data Reviewed: I have personally reviewed following labs and imaging studies  CBC:  Recent Labs Lab 01/17/17 0427 01/18/17 0500 01/19/17 0359 01/20/17 0403 01/21/17 0500  WBC 7.1 4.1 6.0 6.2 5.3  HGB 8.7* 8.0* 8.1* 8.5* 7.6*  HCT 25.5* 23.2* 23.7* 24.9* 22.5*  MCV 90.1 89.6 88.4 88.0 89.3  PLT  67* 73* 87* 113* 91*   Basic Metabolic Panel:  Recent Labs Lab 01/17/17 0427 01/18/17 0500 01/19/17 0359 01/20/17 0403 01/21/17 0500  NA 136 137 137 139 141  K 3.4* 3.2* 3.4* 3.4* 3.1*  CL 109 110 109 110 112*  CO2 21* 21* 23 22 23   GLUCOSE 116* 173* 162* 142* 132*  BUN 29* 28* 31* 33* 33*  CREATININE 2.38* 1.66* 1.35* 1.29* 1.13  CALCIUM 7.3* 7.4* 7.9* 8.0* 7.3*  MG  --   --  1.7  --   --    GFR: Estimated Creatinine Clearance: 52.8 mL/min (by C-G formula based on SCr of 1.13 mg/dL). Liver Function Tests:  Recent Labs Lab 01/17/17 0427 01/18/17 0500 01/19/17 0359 01/20/17 0403 01/21/17 0500  AST 21 44* 40 27 38  ALT 23 46 48 41 54  ALKPHOS 60 53 58 58 48  BILITOT 0.6 0.5 0.4 0.5 0.7  PROT 4.7* 4.6* 4.7* 4.7* 4.2*  ALBUMIN 2.1* 1.9* 1.9* 1.9* 1.8*    Recent Labs Lab 01/16/17 1219  LIPASE 17   No results for input(s): AMMONIA in the last 168 hours. Coagulation Profile:  Recent Labs Lab 01/16/17 1748  INR 1.34   Cardiac Enzymes: No results for input(s): CKTOTAL, CKMB, CKMBINDEX, TROPONINI in the last 168 hours. BNP (last 3 results) No results for input(s): PROBNP in the last 8760 hours. HbA1C: No results for input(s): HGBA1C in the last 72 hours. CBG: No results for input(s): GLUCAP in the last 168 hours. Lipid Profile: No results for input(s): CHOL, HDL, LDLCALC, TRIG, CHOLHDL, LDLDIRECT in the last 72 hours. Thyroid Function Tests: No results for input(s): TSH, T4TOTAL, FREET4, T3FREE, THYROIDAB in the last 72 hours. Anemia Panel: No results for input(s): VITAMINB12, FOLATE, FERRITIN, TIBC, IRON, RETICCTPCT in the last 72 hours. Sepsis Labs:  Recent Labs Lab 01/16/17 1231 01/16/17 1748 01/16/17 2007 01/17/17 0000 01/17/17 0427 01/18/17 0500  PROCALCITON  --  8.91  --   --  10.47 5.41  LATICACIDVEN 1.74  --  1.0 1.2  --   --     Recent Results (from the past 240 hour(s))  Blood Culture (routine x 2)     Status: Abnormal   Collection  Time: 01/16/17  2:52 PM  Result Value Ref Range Status   Specimen Description BLOOD LEFT ANTECUBITAL  Final   Special Requests   Final    BOTTLES DRAWN AEROBIC AND ANAEROBIC Blood Culture adequate volume   Culture  Setup Time   Final    GRAM NEGATIVE RODS IN BOTH AEROBIC AND ANAEROBIC BOTTLES CRITICAL VALUE NOTED.  VALUE IS CONSISTENT WITH PREVIOUSLY REPORTED AND CALLED VALUE.    Culture (A)  Final    KLEBSIELLA PNEUMONIAE SUSCEPTIBILITIES PERFORMED ON PREVIOUS CULTURE WITHIN THE LAST 5 DAYS. Performed at [facility], [place] [address], [place], [place] [address]    Report Status 01/20/2017 FINAL  Final  Blood Culture (routine x 2)     Status:  Abnormal   Collection Time: 01/16/17  3:20 PM  Result Value Ref Range Status   Specimen Description BLOOD PORTA CATH  Final   Special Requests   Final    BOTTLES DRAWN AEROBIC AND ANAEROBIC Blood Culture adequate volume   Culture  Setup Time   Final    IN BOTH AEROBIC AND ANAEROBIC BOTTLES GRAM NEGATIVE RODS CRITICAL RESULT CALLED TO, READ BACK BY AND VERIFIED WITH: Nani Skillern [phone] MLM Performed at [facility], [place] [address] [place], [place] [address]    Culture KLEBSIELLA PNEUMONIAE (A)  Final   Report Status 01/20/2017 FINAL  Final   Organism ID, Bacteria KLEBSIELLA PNEUMONIAE  Final      Susceptibility   Klebsiella pneumoniae - MIC*    AMPICILLIN RESISTANT Resistant     CEFAZOLIN <=4 SENSITIVE Sensitive     CEFEPIME <=1 SENSITIVE Sensitive     CEFTAZIDIME <=1 SENSITIVE Sensitive     CEFTRIAXONE <=1 SENSITIVE Sensitive     CIPROFLOXACIN <=0.25 SENSITIVE Sensitive     GENTAMICIN <=1 SENSITIVE Sensitive     IMIPENEM <=0.25 SENSITIVE Sensitive     TRIMETH/SULFA <=20 SENSITIVE Sensitive     AMPICILLIN/SULBACTAM 4 SENSITIVE Sensitive     PIP/TAZO <=4 SENSITIVE Sensitive     Extended ESBL NEGATIVE Sensitive     * KLEBSIELLA PNEUMONIAE  Blood Culture ID Panel (Reflexed)     Status: Abnormal   Collection Time:  01/16/17  3:20 PM  Result Value Ref Range Status   Enterococcus species NOT DETECTED NOT DETECTED Final   Listeria monocytogenes NOT DETECTED NOT DETECTED Final   Staphylococcus species NOT DETECTED NOT DETECTED Final   Staphylococcus aureus NOT DETECTED NOT DETECTED Final   Streptococcus species NOT DETECTED NOT DETECTED Final   Streptococcus agalactiae NOT DETECTED NOT DETECTED Final   Streptococcus pneumoniae NOT DETECTED NOT DETECTED Final   Streptococcus pyogenes NOT DETECTED NOT DETECTED Final   Acinetobacter baumannii NOT DETECTED NOT DETECTED Final   Enterobacteriaceae species DETECTED (A) NOT DETECTED Final    Comment: Enterobacteriaceae represent a large family of gram-negative bacteria, not a single organism. CRITICAL RESULT CALLED TO, READ BACK BY AND VERIFIED WITH: PHARMD N Farnham [phone] MLM    Enterobacter cloacae complex NOT DETECTED NOT DETECTED Final   Escherichia coli NOT DETECTED NOT DETECTED Final   Klebsiella oxytoca NOT DETECTED NOT DETECTED Final   Klebsiella pneumoniae DETECTED (A) NOT DETECTED Final    Comment: CRITICAL RESULT CALLED TO, READ BACK BY AND VERIFIED WITH: PHARMD N GLOGOVAC [phone] MLM    Proteus species NOT DETECTED NOT DETECTED Final   Serratia marcescens NOT DETECTED NOT DETECTED Final   Carbapenem resistance NOT DETECTED NOT DETECTED Final   Haemophilus influenzae NOT DETECTED NOT DETECTED Final   Neisseria meningitidis NOT DETECTED NOT DETECTED Final   Pseudomonas aeruginosa NOT DETECTED NOT DETECTED Final   Candida albicans NOT DETECTED NOT DETECTED Final   Candida glabrata NOT DETECTED NOT DETECTED Final   Candida krusei NOT DETECTED NOT DETECTED Final   Candida parapsilosis NOT DETECTED NOT DETECTED Final   Candida tropicalis NOT DETECTED NOT DETECTED Final    Comment: Performed at [facility], [address] [place], [place] [address]  Urine Culture     Status: Abnormal   Collection Time: 01/16/17  6:00 PM  Result  Value Ref Range Status   Specimen Description URINE, RANDOM  Final   Special Requests Immunocompromised  Final   Culture 20,000 COLONIES/mL  KLEBSIELLA PNEUMONIAE (A)  Final   Report Status 01/19/2017 FINAL  Final   Organism ID, Bacteria KLEBSIELLA PNEUMONIAE (A)  Final      Susceptibility   Klebsiella pneumoniae - MIC*    AMPICILLIN RESISTANT Resistant     CEFAZOLIN <=4 SENSITIVE Sensitive     CEFTRIAXONE <=1 SENSITIVE Sensitive     CIPROFLOXACIN <=0.25 SENSITIVE Sensitive     GENTAMICIN <=1 SENSITIVE Sensitive     IMIPENEM <=0.25 SENSITIVE Sensitive     NITROFURANTOIN 32 SENSITIVE Sensitive     TRIMETH/SULFA <=20 SENSITIVE Sensitive     AMPICILLIN/SULBACTAM 4 SENSITIVE Sensitive     PIP/TAZO <=4 SENSITIVE Sensitive     Extended ESBL NEGATIVE Sensitive     * 20,000 COLONIES/mL KLEBSIELLA PNEUMONIAE  MRSA PCR Screening     Status: None   Collection Time: 01/16/17  6:00 PM  Result Value Ref Range Status   MRSA by PCR NEGATIVE NEGATIVE Final    Comment:        The GeneXpert MRSA Assay (FDA approved for NASAL specimens only), is one component of a comprehensive MRSA colonization surveillance program. It is not intended to diagnose MRSA infection nor to guide or monitor treatment for MRSA infections.   C difficile quick scan w PCR reflex     Status: None   Collection Time: 01/16/17 11:36 PM  Result Value Ref Range Status   C Diff antigen NEGATIVE NEGATIVE Final   C Diff toxin NEGATIVE NEGATIVE Final   C Diff interpretation No C. difficile detected.  Final         Radiology Studies: No results found.      Scheduled Meds: . Chlorhexidine Gluconate Cloth  6 each Topical Daily  . ciprofloxacin  500 mg Oral BID  . feeding supplement  1 Container Oral TID BM  . hydrocortisone sod succinate (SOLU-CORTEF) inj  100 mg Intravenous Q8H  . multivitamin with minerals  1 tablet Oral Daily  . rOPINIRole  0.5 mg Oral QHS  . rOPINIRole  0.5 mg Oral Once  . sodium chloride  flush  10 mL Intravenous Q8H   Continuous Infusions: . phenylephrine (NEO-SYNEPHRINE) Adult infusion Stopped (01/17/17 0505)  . sodium chloride       LOS: 5 days     Georgette Shell, MD [facility]  If 7PM-7AM, please contact night-coverage [email or web address] Password [email or web address] 01/21/2017, 11:50 AM

## 2017-01-21 NOTE — Progress Notes (Signed)
Pharmacy Antibiotic Note  Randall Little is a 83 y.o. male admitted on 01/16/2017 with urosepsis and dislodged nephrostomy tube.  Tubed was replaced 10/14. PMH significant high grade urothelial carcinoma on weekly chemo.   Pharmacy has been consulted for Meropenem dosing for Klebsiella bacteremia.   01/21/2017:  Day #6 Meropenem 1g IV q12h.  AKI resolving  Afebrile  Urine & Blood cx + Klebsiella pneumoniae (resistant to ampicillin only)  Plan: Increase Meropenem 1000mg  IV q8h for improved SCr. Recommend de-escalating antibiotics. No history of cephalosporin use in [facility] but would consider Ceftriaxone since low cross reactivity and patient reaction to penicillin G documented as rash of low severity.  If wish to avoid beta-lactams, would consider narrowing to Bactrim.  Would also consider stopping or tapering off stress steroids since no longer on pressors.  Height: 5\' 11"  (180.3 cm) Weight: 163 lb 2.3 oz (74 kg) IBW/kg (Calculated) : 75.3  Temp (24hrs), Avg:97.9 F (36.6 C), Min:97.5 F (36.4 C), Max:98.5 F (36.9 C)   Recent Labs Lab 01/16/17 1231  01/16/17 2007 01/17/17 0000 01/17/17 0427 01/18/17 0500 01/19/17 0359 01/20/17 0403 01/21/17 0500  WBC  --   < >  --   --  7.1 4.1 6.0 6.2 5.3  CREATININE  --   < >  --   --  2.38* 1.66* 1.35* 1.29* 1.13  LATICACIDVEN 1.74  --  1.0 1.2  --   --   --   --   --   < > = values in this interval not displayed.  Estimated Creatinine Clearance: 52.8 mL/min (by C-G formula based on SCr of 1.13 mg/dL).    Allergies  Allergen Reactions  . Ativan [Lorazepam]     delirium  . Penicillin G Rash    Has patient had a PCN reaction causing immediate rash, facial/tongue/throat swelling, SOB or lightheadedness with hypotension: Yes Has patient had a PCN reaction causing severe rash involving mucus membranes or skin necrosis: No Has patient had a PCN reaction that required hospitalization: No Has patient had a PCN reaction occurring within  the last 10 years: Yes If all of the above answers are "NO", then may proceed with Cephalosporin use.    Antimicrobials this admission: 10/14 Levaquin x 1 10/14 Flagyl x 1 10/14 Vancomycin >>10/15 10/14 Meropenem >>   Dose adjustments this admission: 10/16: Meropenem inc to 1gm BID for improved renal fxn (CrCl 30-50)   Microbiology results: 10/14 BCx: Klebsiella- R to amp only 10/14 UCx: 20K Klebsiella - R to amp only (drawn after abx given) 10/14 Cdiff scan: negative 10/14 MRSA PCR: negative  Thank you for allowing pharmacy to be a part of this patient's care.  Hershal Coria 01/21/2017 11:19 AM

## 2017-01-21 NOTE — Progress Notes (Signed)
Radiation therapy cancelled for today per hospitalist, Landis Gandy note. Dr. Tammi Klippel and Trudee Kuster, RT on L3 informed of this finding.

## 2017-01-21 NOTE — Progress Notes (Signed)
Nutrition Follow-up  DOCUMENTATION CODES:   Not applicable  INTERVENTION:  - Continue Boost Breeze TID. - Continue to encourage PO intakes of meals and supplement.   NUTRITION DIAGNOSIS:   Increased nutrient needs related to catabolic illness, cancer and cancer related treatments as evidenced by estimated needs. -ongoing  GOAL:   Patient will meet greater than or equal to 90% of their needs -likely minimally met on average.   MONITOR:   PO intake, Supplement acceptance, Weight trends, Labs, Skin  ASSESSMENT:   83 y.o. 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 Alen Blew status post 3 chemotherapy cycles and 15 radiation cycles all palliative, essential hypertension, dyslipidemia who lives at home, comes in with a few day history of lower abdominal discomfort and mild diarrhea, today he noted that his left nephrostomy tube had bloody drainage, came to the ER where workup showed sepsis, fever, ARF CT evidence of left-sided hydronephrosis and partially dislodged left nephrostomy tube.  10/19 Diet advanced from CLD to Soft diet shortly after RD visit on 10/15. Per doc flow sheet, he consumed 75% of breakfast (453 kcal, 20 grams of protein) and 50% of dinner (300 kcal, 15 grams of protein) on 10/17. Breakfast was ordered/delivered this AM and was 734 kcal, 29 grams of protein. Per review of orders, pt has been accepting Boost Breeze nearly every time that it is offered. Pt and male visitor both sleeping soundly at this time; did not feel it was necessary to awake them. Per chart review, weight +20 lbs/9.2 kg from 10/14-10/17 and has been stable over the past 2 days.   Spoke with RN. She reports that pt did very well at breakfast and ate ~75% of the meal. He has not been mentioning any abdominal pain or nausea and he has not been having  diarrhea. He told RN previously that he went from not having an appetite to not being able to get enough to eat. She states that pt has been enjoying [employer].  Reviewed Palliative Care note from yesterday afternoon. Plan is to d/c home with hospice and pt would like to have a natural death at home. Radiation was cancelled for today as pt does not desire to receive any more chemo or radiation treatments for his bladder cancer. Palliative Care note states prognosis of "<6 months due to aggressive cancer and no further plans for treatment."  Medications reviewed; 100 mg Solu-cortef TID, 2 mg Imodium TID, daily multivitamin with minerals, 40 mEq oral KCl x1 dose today. Labs reviewed; K: 3.1 mmol/L, Cl: 112 mmol/L, BUN: 33 mg/dL, Ca: 7.3 mg/dL.    10/15 - Pt on CLD with no documented intakes.  - Pt noted to be a/o to self only and no family/visitors present at this time.  - Pt sleeping at time of RD arrival but awoke to name call x2.  - He denies abdominal pain or nausea at this time. - He recalls recall having sips of water earlier this AM and denies coughing with sips.  - When asked about appetite PTA, pt states "I have not been home in 2-3 days."   Physical assessment shows no fat wasting, mild/moderate muscle wasting noted to shoulder and temporal regions, no edema.   - Per chart review, pt has lost 8 lbs (5% body weight) in the past 5 weeks.  - This is not significant for time frame.  - Pt  does not meet criteria for malnutrition based on ASPEN guidelines but is at high risk for malnutrition.  IVF: LR @ 75 mL/hr.    Diet Order:  DIET SOFT Room service appropriate? Yes; Fluid consistency: Thin  Skin:  Wound (see comment) (Stage 2 bilateral buttocks)  Last BM:  10/18  Height:   Ht Readings from Last 1 Encounters:  01/17/17 '5\' 11"'$  (1.803 m)    Weight:   Wt Readings from Last 1 Encounters:  01/21/17 163 lb 2.3 oz (74 kg)    Ideal Body Weight:  78.18 kg  BMI:  Body mass  index is 22.75 kg/m.  Estimated Nutritional Needs:   Kcal:  1920-2115 (30-33 kcal/kg)  Protein:  90-100 grams  Fluid:  >/= 2 L/day  EDUCATION NEEDS:   No education needs identified at this time     Jarome Matin, MS, RD, LDN, CNSC Inpatient Clinical Dietitian Pager # [phone] After hours/weekend pager # [phone]

## 2017-01-22 MED ORDER — ZOLPIDEM TARTRATE 5 MG PO TABS
5.0000 mg | ORAL_TABLET | Freq: Once | ORAL | Status: AC
  Administered 2017-01-22: 5 mg via ORAL
  Filled 2017-01-22: qty 1

## 2017-01-22 MED ORDER — ZOLPIDEM TARTRATE 5 MG PO TABS
5.0000 mg | ORAL_TABLET | Freq: Every evening | ORAL | Status: DC | PRN
  Administered 2017-01-24: 5 mg via ORAL
  Filled 2017-01-22: qty 1

## 2017-01-22 NOTE — Progress Notes (Signed)
Pt. arrived to floor from ICU. Alert and oriented x 4, family at bedside. No respiratory distress noted.

## 2017-01-22 NOTE — Progress Notes (Signed)
PROGRESS NOTE    Randall Little  ZOX:[id number] DOB: 12/23/1932 DOA: 01/16/2017 PCP: Leonard Downing, MD Brief Narrative:84 y.o.male,With history of urothelial bladder carcinoma causing bilateral hydronephrosis acquiring bilateral nephrostomy tube placement by IR, undergoing chemotherapy and radiation under the care of Dr. Alen Blew status post 3 chemotherapy cycles and 15 radiation cycles all palliative, essential hypertension, dyslipidemia who lives at home, comes in with a few day history of lower abdominal discomfort and mild diarrhea, today he noted that his left nephrostomy tube had bloody drainage, came to the ER where workup showed sepsis, fever, ARF CT evidence of left-sided hydronephrosis and partially dislodged left nephrostomy tube. patient seen today.he is resting in bed.hehas seen palliative care,and did not want to continue radiation.  Rectal tube removed.no diarrhea overnite. Patient much more alert and talking.  Plan go home with hospice on monday   Assessment & Plan:   Principal Problem:   Sepsis due to urinary tract infection ([place]) Active Problems:   Hyperlipidemia   Essential hypertension   Acute renal failure (HCC)   Hydronephrosis   Bladder tumor   Pressure injury of skin   Advance care planning   Palliative care by specialist  1-Sepsis due to partially dislodged left nephrostomy tube with hydronephrosis and gram-negative bacteremia and sepsis. nephrostomy tubes in place.  Patient has klebsiella bacteremia resistant only to ampicillin.  Will DC meropenem.  Start ciprofloxacin twice a day.  2. Urothelial bladder cancer. Discussed his case with his oncologist Dr. Alen Blew, all treatments are palliative, supportive care for now.  3. ARF. Due to #1 &5. lan as in #1 above along with IV fluids, renal function improving gradually.  4. Hypertension. Currently blood pressure low. Hold blood pressure medications.  5. Diarrhea. Likely due to radiation  exposure. Negative C. Difficile will start using Imodium when necessary.  Rectal tube DC'd yesterday.  DVT prophylaxis:scd Code Status:dnr Family Communication exwife : Disposition Plan: Dc Monday home with hospice  Consultants:  palliative   Procedures   Subjective:no new co.   Objective: Vitals:   01/22/17 0800 01/22/17 0900 01/22/17 1000 01/22/17 1200  BP: 130/69     Pulse: (!) 58 (!) 58 60   Resp: 16 18 14    Temp: 98.1 F (36.7 C)   97.6 F (36.4 C)  TempSrc: Oral   Oral  SpO2: 94% 95% 96%   Weight:      Height:        Intake/Output Summary (Last 24 hours) at 01/22/17 1314 Last data filed at 01/22/17 1015  Gross per 24 hour  Intake               90 ml  Output             1250 ml  Net            -1160 ml   Filed Weights   01/19/17 0402 01/20/17 0406 01/21/17 0346  Weight: 73.2 kg (161 lb 6 oz) 74.1 kg (163 lb 5.8 oz) 74 kg (163 lb 2.3 oz)    Examination:  General exam: Appears calm and comfortable  Respiratory system: Clear to auscultation. Respiratory effort normal. Cardiovascular system: S1 & S2 heard, RRR. No JVD, murmurs, rubs, gallops or clicks. No pedal edema. Gastrointestinal system: Abdomen is nondistended, soft and nontender. No organomegaly or masses felt. Normal bowel sounds heard. Central nervous system: Alert and oriented. No focal neurological deficits. Extremities: Symmetric 5 x 5 power. Skin: No rashes, lesions or ulcers Psychiatry: Judgement and insight appear  normal. Mood & affect appropriate.     Data Reviewed: I have personally reviewed following labs and imaging studies  CBC:  Recent Labs Lab 01/17/17 0427 01/18/17 0500 01/19/17 0359 01/20/17 0403 01/21/17 0500  WBC 7.1 4.1 6.0 6.2 5.3  HGB 8.7* 8.0* 8.1* 8.5* 7.6*  HCT 25.5* 23.2* 23.7* 24.9* 22.5*  MCV 90.1 89.6 88.4 88.0 89.3  PLT 67* 73* 87* 113* 91*   Basic Metabolic Panel:  Recent Labs Lab 01/17/17 0427 01/18/17 0500 01/19/17 0359 01/20/17 0403  01/21/17 0500  NA 136 137 137 139 141  K 3.4* 3.2* 3.4* 3.4* 3.1*  CL 109 110 109 110 112*  CO2 21* 21* 23 22 23   GLUCOSE 116* 173* 162* 142* 132*  BUN 29* 28* 31* 33* 33*  CREATININE 2.38* 1.66* 1.35* 1.29* 1.13  CALCIUM 7.3* 7.4* 7.9* 8.0* 7.3*  MG  --   --  1.7  --   --    GFR: Estimated Creatinine Clearance: 52.8 mL/min (by C-G formula based on SCr of 1.13 mg/dL). Liver Function Tests:  Recent Labs Lab 01/17/17 0427 01/18/17 0500 01/19/17 0359 01/20/17 0403 01/21/17 0500  AST 21 44* 40 27 38  ALT 23 46 48 41 54  ALKPHOS 60 53 58 58 48  BILITOT 0.6 0.5 0.4 0.5 0.7  PROT 4.7* 4.6* 4.7* 4.7* 4.2*  ALBUMIN 2.1* 1.9* 1.9* 1.9* 1.8*    Recent Labs Lab 01/16/17 1219  LIPASE 17   No results for input(s): AMMONIA in the last 168 hours. Coagulation Profile:  Recent Labs Lab 01/16/17 1748  INR 1.34   Cardiac Enzymes: No results for input(s): CKTOTAL, CKMB, CKMBINDEX, TROPONINI in the last 168 hours. BNP (last 3 results) No results for input(s): PROBNP in the last 8760 hours. HbA1C: No results for input(s): HGBA1C in the last 72 hours. CBG: No results for input(s): GLUCAP in the last 168 hours. Lipid Profile: No results for input(s): CHOL, HDL, LDLCALC, TRIG, CHOLHDL, LDLDIRECT in the last 72 hours. Thyroid Function Tests: No results for input(s): TSH, T4TOTAL, FREET4, T3FREE, THYROIDAB in the last 72 hours. Anemia Panel: No results for input(s): VITAMINB12, FOLATE, FERRITIN, TIBC, IRON, RETICCTPCT in the last 72 hours. Sepsis Labs:  Recent Labs Lab 01/16/17 1231 01/16/17 1748 01/16/17 2007 01/17/17 0000 01/17/17 0427 01/18/17 0500  PROCALCITON  --  8.91  --   --  10.47 5.41  LATICACIDVEN 1.74  --  1.0 1.2  --   --     Recent Results (from the past 240 hour(s))  Blood Culture (routine x 2)     Status: Abnormal   Collection Time: 01/16/17  2:52 PM  Result Value Ref Range Status   Specimen Description BLOOD LEFT ANTECUBITAL  Final   Special Requests    Final    BOTTLES DRAWN AEROBIC AND ANAEROBIC Blood Culture adequate volume   Culture  Setup Time   Final    GRAM NEGATIVE RODS IN BOTH AEROBIC AND ANAEROBIC BOTTLES CRITICAL VALUE NOTED.  VALUE IS CONSISTENT WITH PREVIOUSLY REPORTED AND CALLED VALUE.    Culture (A)  Final    KLEBSIELLA PNEUMONIAE SUSCEPTIBILITIES PERFORMED ON PREVIOUS CULTURE WITHIN THE LAST 5 DAYS. Performed at [facility], [place] [address] [place], [place] [address]    Report Status 01/20/2017 FINAL  Final  Blood Culture (routine x 2)     Status: Abnormal   Collection Time: 01/16/17  3:20 PM  Result Value Ref Range Status   Specimen Description BLOOD PORTA CATH  Final  Special Requests   Final    BOTTLES DRAWN AEROBIC AND ANAEROBIC Blood Culture adequate volume   Culture  Setup Time   Final    IN BOTH AEROBIC AND ANAEROBIC BOTTLES GRAM NEGATIVE RODS CRITICAL RESULT CALLED TO, READ BACK BY AND VERIFIED WITH: Nani Skillern [id number] MLM Performed at [facility], [place] [address], [place], [place] [address]    Culture KLEBSIELLA PNEUMONIAE (A)  Final   Report Status 01/20/2017 FINAL  Final   Organism ID, Bacteria KLEBSIELLA PNEUMONIAE  Final      Susceptibility   Klebsiella pneumoniae - MIC*    AMPICILLIN RESISTANT Resistant     CEFAZOLIN <=4 SENSITIVE Sensitive     CEFEPIME <=1 SENSITIVE Sensitive     CEFTAZIDIME <=1 SENSITIVE Sensitive     CEFTRIAXONE <=1 SENSITIVE Sensitive     CIPROFLOXACIN <=0.25 SENSITIVE Sensitive     GENTAMICIN <=1 SENSITIVE Sensitive     IMIPENEM <=0.25 SENSITIVE Sensitive     TRIMETH/SULFA <=20 SENSITIVE Sensitive     AMPICILLIN/SULBACTAM 4 SENSITIVE Sensitive     PIP/TAZO <=4 SENSITIVE Sensitive     Extended ESBL NEGATIVE Sensitive     * KLEBSIELLA PNEUMONIAE  Blood Culture ID Panel (Reflexed)     Status: Abnormal   Collection Time: 01/16/17  3:20 PM  Result Value Ref Range Status   Enterococcus species NOT DETECTED NOT DETECTED Final   Listeria monocytogenes  NOT DETECTED NOT DETECTED Final   Staphylococcus species NOT DETECTED NOT DETECTED Final   Staphylococcus aureus NOT DETECTED NOT DETECTED Final   Streptococcus species NOT DETECTED NOT DETECTED Final   Streptococcus agalactiae NOT DETECTED NOT DETECTED Final   Streptococcus pneumoniae NOT DETECTED NOT DETECTED Final   Streptococcus pyogenes NOT DETECTED NOT DETECTED Final   Acinetobacter baumannii NOT DETECTED NOT DETECTED Final   Enterobacteriaceae species DETECTED (A) NOT DETECTED Final    Comment: Enterobacteriaceae represent a large family of gram-negative bacteria, not a single organism. CRITICAL RESULT CALLED TO, READ BACK BY AND VERIFIED WITH: PHARMD N [place] [phone] MLM    Enterobacter cloacae complex NOT DETECTED NOT DETECTED Final   Escherichia coli NOT DETECTED NOT DETECTED Final   Klebsiella oxytoca NOT DETECTED NOT DETECTED Final   Klebsiella pneumoniae DETECTED (A) NOT DETECTED Final    Comment: CRITICAL RESULT CALLED TO, READ BACK BY AND VERIFIED WITH: PHARMD N GLOGOVAC [phone] MLM    Proteus species NOT DETECTED NOT DETECTED Final   Serratia marcescens NOT DETECTED NOT DETECTED Final   Carbapenem resistance NOT DETECTED NOT DETECTED Final   Haemophilus influenzae NOT DETECTED NOT DETECTED Final   Neisseria meningitidis NOT DETECTED NOT DETECTED Final   Pseudomonas aeruginosa NOT DETECTED NOT DETECTED Final   Candida albicans NOT DETECTED NOT DETECTED Final   Candida glabrata NOT DETECTED NOT DETECTED Final   Candida krusei NOT DETECTED NOT DETECTED Final   Candida parapsilosis NOT DETECTED NOT DETECTED Final   Candida tropicalis NOT DETECTED NOT DETECTED Final    Comment: Performed at [facility], [address], [place], [place] [address]  Urine Culture     Status: Abnormal   Collection Time: 01/16/17  6:00 PM  Result Value Ref Range Status   Specimen Description URINE, RANDOM  Final   Special Requests Immunocompromised  Final   Culture 20,000  COLONIES/mL KLEBSIELLA PNEUMONIAE (A)  Final   Report Status 01/19/2017 FINAL  Final   Organism ID, Bacteria KLEBSIELLA PNEUMONIAE (A)  Final  Susceptibility   Klebsiella pneumoniae - MIC*    AMPICILLIN RESISTANT Resistant     CEFAZOLIN <=4 SENSITIVE Sensitive     CEFTRIAXONE <=1 SENSITIVE Sensitive     CIPROFLOXACIN <=0.25 SENSITIVE Sensitive     GENTAMICIN <=1 SENSITIVE Sensitive     IMIPENEM <=0.25 SENSITIVE Sensitive     NITROFURANTOIN 32 SENSITIVE Sensitive     TRIMETH/SULFA <=20 SENSITIVE Sensitive     AMPICILLIN/SULBACTAM 4 SENSITIVE Sensitive     PIP/TAZO <=4 SENSITIVE Sensitive     Extended ESBL NEGATIVE Sensitive     * 20,000 COLONIES/mL KLEBSIELLA PNEUMONIAE  MRSA PCR Screening     Status: None   Collection Time: 01/16/17  6:00 PM  Result Value Ref Range Status   MRSA by PCR NEGATIVE NEGATIVE Final    Comment:        The GeneXpert MRSA Assay (FDA approved for NASAL specimens only), is one component of a comprehensive MRSA colonization surveillance program. It is not intended to diagnose MRSA infection nor to guide or monitor treatment for MRSA infections.   C difficile quick scan w PCR reflex     Status: None   Collection Time: 01/16/17 11:36 PM  Result Value Ref Range Status   C Diff antigen NEGATIVE NEGATIVE Final   C Diff toxin NEGATIVE NEGATIVE Final   C Diff interpretation No C. difficile detected.  Final         Radiology Studies: No results found.      Scheduled Meds: . Chlorhexidine Gluconate Cloth  6 each Topical Daily  . ciprofloxacin  500 mg Oral BID  . feeding supplement  1 Container Oral TID BM  . multivitamin with minerals  1 tablet Oral Daily  . rOPINIRole  0.5 mg Oral QHS  . rOPINIRole  0.5 mg Oral Once  . sodium chloride flush  10 mL Intravenous Q8H   Continuous Infusions: . phenylephrine (NEO-SYNEPHRINE) Adult infusion Stopped (01/17/17 0505)  . sodium chloride       LOS: 6 days     Georgette Shell, MD [facility]   If 7PM-7AM, please contact night-coverage [email or web address] Password [email or web address] 01/22/2017, 1:14 PM

## 2017-01-23 MED ORDER — ALPRAZOLAM 0.25 MG PO TABS
0.2500 mg | ORAL_TABLET | Freq: Three times a day (TID) | ORAL | Status: DC | PRN
  Administered 2017-01-23 – 2017-01-24 (×3): 0.25 mg via ORAL
  Filled 2017-01-23 (×4): qty 1

## 2017-01-23 NOTE — Progress Notes (Signed)
Pt sat up in chair for a couple hours today. Wife at bedside. Dressings changed to nephrostomy tubes. Rt. Nephrostomy entrance site reddened, stitches intact for both tubes. DSD reapplied to both sites. Buttocks very reddened, area cleansed and foam dressings applied to buttocks. Encouraged to stay off back side.

## 2017-01-23 NOTE — Progress Notes (Signed)
PROGRESS NOTE    UNIQUE SEARFOSS  OJJ:[id number] DOB: 06/20/1932 DOA: 01/16/2017 PCP: Leonard Downing, MD  Brief Narrative: 83 y.o.male,With history of urothelial bladder carcinoma causing bilateral hydronephrosis acquiring bilateral nephrostomy tube placement by IR, undergoing chemotherapy and radiation under the care of Dr. Alen Blew status post 3 chemotherapy cycles and 15 radiation cycles all palliative, essential hypertension, dyslipidemia who lives at home, comes in with a few day history of lower abdominal discomfort and mild diarrhea, today he noted that his left nephrostomy tube had bloody drainage, came to the ER where workup showed sepsis, fever, ARF CT evidence of left-sided hydronephrosis and partially dislodged left nephrostomy tube. patient seen today.he is resting in bed.hehas seen palliative care,and did not want to continue radiation.  Rectal tube removed.no diarrhea overnite. Patient much more alert and talking.Plan go home with hospice on Monday Patient complained of some nausea overnight otherwise no new complaints.  Denies diarrhea at this time.   Assessment & Plan:   Principal Problem:   Sepsis due to urinary tract infection ([place]) Active Problems:   Hyperlipidemia   Essential hypertension   Acute renal failure (HCC)   Hydronephrosis   Bladder tumor   Pressure injury of skin   Advance care planning   Palliative care by specialist   1-Sepsis due to partially dislodged left nephrostomy tube with hydronephrosis and gram-negative bacteremia and sepsis. nephrostomy tubes in place.Patient has klebsiella bacteremia resistant only to ampicillin.Will DC meropenem.Start ciprofloxacin twice a day.  2. Urothelial bladder cancer. Discussed his case with his oncologist Dr. Alen Blew, all treatments are palliative, supportive care for now.  3. ARF. Due to #1 &5. lan as in #1 above along with IV fluids, renal function improving gradually.  4. Hypertension.  Currently blood pressure low. Hold blood pressure medications.  5. Diarrhea. Likely due to radiation exposure. Negative C. Difficile will start using Imodium when necessary.Rectal tube DC'd yesterday.  DVT prophylaxis:scd Code Status:dnr Disposition Plan:  DC home tomorrow with hospice.  Consultants:  Palliative care Procedures:  Antimicrobials:   Subjective: Planes of anxiety and nausea   Objective: Vitals:   01/22/17 1525 01/22/17 1545 01/22/17 2100 01/23/17 0515  BP: (!) 157/80 138/77 128/79 135/86  Pulse: (!) 56 (!) 59 63 92  Resp: 18 18 16 16   Temp: (!) 97.3 F (36.3 C) (!) 97.4 F (36.3 C) 98.7 F (37.1 C) 98.8 F (37.1 C)  TempSrc: Oral Oral Oral Oral  SpO2: 97% 100% 98% 97%  Weight: 71.1 kg (156 lb 12 oz)     Height: 5\' 11"  (1.803 m)       Intake/Output Summary (Last 24 hours) at 01/23/17 1136 Last data filed at 01/23/17 1100  Gross per 24 hour  Intake              390 ml  Output             1500 ml  Net            -1110 ml   Filed Weights   01/20/17 0406 01/21/17 0346 01/22/17 1525  Weight: 74.1 kg (163 lb 5.8 oz) 74 kg (163 lb 2.3 oz) 71.1 kg (156 lb 12 oz)    Examination:  General exam: Appears calm and comfortable  Respiratory system: Clear to auscultation. Respiratory effort normal. Cardiovascular system: S1 & S2 heard, RRR. No JVD, murmurs, rubs, gallops or clicks. No pedal edema. Gastrointestinal system: Abdomen is nondistended, soft and nontender. No organomegaly or masses felt. Normal bowel sounds heard.  Bilateral nephrostomy tubes draining yellow clear urine. Central nervous system: Alert and oriented. No focal neurological deficits. Extremities: Symmetric 5 x 5 power. Skin: No rashes, lesions or ulcers Psychiatry: Judgement and insight appear normal. Mood & affect appropriate.     Data Reviewed: I have personally reviewed following labs and imaging studies  CBC:  Recent Labs Lab 01/17/17 0427 01/18/17 0500 01/19/17 0359  01/20/17 0403 01/21/17 0500  WBC 7.1 4.1 6.0 6.2 5.3  HGB 8.7* 8.0* 8.1* 8.5* 7.6*  HCT 25.5* 23.2* 23.7* 24.9* 22.5*  MCV 90.1 89.6 88.4 88.0 89.3  PLT 67* 73* 87* 113* 91*   Basic Metabolic Panel:  Recent Labs Lab 01/17/17 0427 01/18/17 0500 01/19/17 0359 01/20/17 0403 01/21/17 0500  NA 136 137 137 139 141  K 3.4* 3.2* 3.4* 3.4* 3.1*  CL 109 110 109 110 112*  CO2 21* 21* 23 22 23   GLUCOSE 116* 173* 162* 142* 132*  BUN 29* 28* 31* 33* 33*  CREATININE 2.38* 1.66* 1.35* 1.29* 1.13  CALCIUM 7.3* 7.4* 7.9* 8.0* 7.3*  MG  --   --  1.7  --   --    GFR: Estimated Creatinine Clearance: 50.7 mL/min (by C-G formula based on SCr of 1.13 mg/dL). Liver Function Tests:  Recent Labs Lab 01/17/17 0427 01/18/17 0500 01/19/17 0359 01/20/17 0403 01/21/17 0500  AST 21 44* 40 27 38  ALT 23 46 48 41 54  ALKPHOS 60 53 58 58 48  BILITOT 0.6 0.5 0.4 0.5 0.7  PROT 4.7* 4.6* 4.7* 4.7* 4.2*  ALBUMIN 2.1* 1.9* 1.9* 1.9* 1.8*    Recent Labs Lab 01/16/17 1219  LIPASE 17   No results for input(s): AMMONIA in the last 168 hours. Coagulation Profile:  Recent Labs Lab 01/16/17 1748  INR 1.34   Cardiac Enzymes: No results for input(s): CKTOTAL, CKMB, CKMBINDEX, TROPONINI in the last 168 hours. BNP (last 3 results) No results for input(s): PROBNP in the last 8760 hours. HbA1C: No results for input(s): HGBA1C in the last 72 hours. CBG: No results for input(s): GLUCAP in the last 168 hours. Lipid Profile: No results for input(s): CHOL, HDL, LDLCALC, TRIG, CHOLHDL, LDLDIRECT in the last 72 hours. Thyroid Function Tests: No results for input(s): TSH, T4TOTAL, FREET4, T3FREE, THYROIDAB in the last 72 hours. Anemia Panel: No results for input(s): VITAMINB12, FOLATE, FERRITIN, TIBC, IRON, RETICCTPCT in the last 72 hours. Sepsis Labs:  Recent Labs Lab 01/16/17 1231 01/16/17 1748 01/16/17 2007 01/17/17 0000 01/17/17 0427 01/18/17 0500  PROCALCITON  --  8.91  --   --  10.47 5.41   LATICACIDVEN 1.74  --  1.0 1.2  --   --     Recent Results (from the past 240 hour(s))  Blood Culture (routine x 2)     Status: Abnormal   Collection Time: 01/16/17  2:52 PM  Result Value Ref Range Status   Specimen Description BLOOD LEFT ANTECUBITAL  Final   Special Requests   Final    BOTTLES DRAWN AEROBIC AND ANAEROBIC Blood Culture adequate volume   Culture  Setup Time   Final    GRAM NEGATIVE RODS IN BOTH AEROBIC AND ANAEROBIC BOTTLES CRITICAL VALUE NOTED.  VALUE IS CONSISTENT WITH PREVIOUSLY REPORTED AND CALLED VALUE.    Culture (A)  Final    KLEBSIELLA PNEUMONIAE SUSCEPTIBILITIES PERFORMED ON PREVIOUS CULTURE WITHIN THE LAST 5 DAYS. Performed at [facility], [place] [address], [place],  [address]    Report Status 01/20/2017 FINAL  Final  Blood Culture (routine x 2)     Status: Abnormal   Collection Time: 01/16/17  3:20 PM  Result Value Ref Range Status   Specimen Description BLOOD PORTA CATH  Final   Special Requests   Final    BOTTLES DRAWN AEROBIC AND ANAEROBIC Blood Culture adequate volume   Culture  Setup Time   Final    IN BOTH AEROBIC AND ANAEROBIC BOTTLES GRAM NEGATIVE RODS CRITICAL RESULT CALLED TO, READ BACK BY AND VERIFIED WITH: Nani Skillern [phone] MLM Performed at [facility], [place] [address] [place], [place] [address]    Culture KLEBSIELLA PNEUMONIAE (A)  Final   Report Status 01/20/2017 FINAL  Final   Organism ID, Bacteria KLEBSIELLA PNEUMONIAE  Final      Susceptibility   Klebsiella pneumoniae - MIC*    AMPICILLIN RESISTANT Resistant     CEFAZOLIN <=4 SENSITIVE Sensitive     CEFEPIME <=1 SENSITIVE Sensitive     CEFTAZIDIME <=1 SENSITIVE Sensitive     CEFTRIAXONE <=1 SENSITIVE Sensitive     CIPROFLOXACIN <=0.25 SENSITIVE Sensitive     GENTAMICIN <=1 SENSITIVE Sensitive     IMIPENEM <=0.25 SENSITIVE Sensitive     TRIMETH/SULFA <=20 SENSITIVE Sensitive     AMPICILLIN/SULBACTAM 4 SENSITIVE Sensitive     PIP/TAZO <=4  SENSITIVE Sensitive     Extended ESBL NEGATIVE Sensitive     * KLEBSIELLA PNEUMONIAE  Blood Culture ID Panel (Reflexed)     Status: Abnormal   Collection Time: 01/16/17  3:20 PM  Result Value Ref Range Status   Enterococcus species NOT DETECTED NOT DETECTED Final   Listeria monocytogenes NOT DETECTED NOT DETECTED Final   Staphylococcus species NOT DETECTED NOT DETECTED Final   Staphylococcus aureus NOT DETECTED NOT DETECTED Final   Streptococcus species NOT DETECTED NOT DETECTED Final   Streptococcus agalactiae NOT DETECTED NOT DETECTED Final   Streptococcus pneumoniae NOT DETECTED NOT DETECTED Final   Streptococcus pyogenes NOT DETECTED NOT DETECTED Final   Acinetobacter baumannii NOT DETECTED NOT DETECTED Final   Enterobacteriaceae species DETECTED (A) NOT DETECTED Final    Comment: Enterobacteriaceae represent a large family of gram-negative bacteria, not a single organism. CRITICAL RESULT CALLED TO, READ BACK BY AND VERIFIED WITH: PHARMD N [place] [phone] MLM    Enterobacter cloacae complex NOT DETECTED NOT DETECTED Final   Escherichia coli NOT DETECTED NOT DETECTED Final   Klebsiella oxytoca NOT DETECTED NOT DETECTED Final   Klebsiella pneumoniae DETECTED (A) NOT DETECTED Final    Comment: CRITICAL RESULT CALLED TO, READ BACK BY AND VERIFIED WITH: PHARMD N GLOGOVAC [phone] MLM    Proteus species NOT DETECTED NOT DETECTED Final   Serratia marcescens NOT DETECTED NOT DETECTED Final   Carbapenem resistance NOT DETECTED NOT DETECTED Final   Haemophilus influenzae NOT DETECTED NOT DETECTED Final   Neisseria meningitidis NOT DETECTED NOT DETECTED Final   Pseudomonas aeruginosa NOT DETECTED NOT DETECTED Final   Candida albicans NOT DETECTED NOT DETECTED Final   Candida glabrata NOT DETECTED NOT DETECTED Final   Candida krusei NOT DETECTED NOT DETECTED Final   Candida parapsilosis NOT DETECTED NOT DETECTED Final   Candida tropicalis NOT DETECTED NOT DETECTED Final     Comment: Performed at [facility], [address] [place], [place] [address]  Urine Culture     Status: Abnormal   Collection Time: 01/16/17  6:00 PM  Result Value Ref Range Status   Specimen Description URINE, RANDOM  Final  Special Requests Immunocompromised  Final   Culture 20,000 COLONIES/mL KLEBSIELLA PNEUMONIAE (A)  Final   Report Status 01/19/2017 FINAL  Final   Organism ID, Bacteria KLEBSIELLA PNEUMONIAE (A)  Final      Susceptibility   Klebsiella pneumoniae - MIC*    AMPICILLIN RESISTANT Resistant     CEFAZOLIN <=4 SENSITIVE Sensitive     CEFTRIAXONE <=1 SENSITIVE Sensitive     CIPROFLOXACIN <=0.25 SENSITIVE Sensitive     GENTAMICIN <=1 SENSITIVE Sensitive     IMIPENEM <=0.25 SENSITIVE Sensitive     NITROFURANTOIN 32 SENSITIVE Sensitive     TRIMETH/SULFA <=20 SENSITIVE Sensitive     AMPICILLIN/SULBACTAM 4 SENSITIVE Sensitive     PIP/TAZO <=4 SENSITIVE Sensitive     Extended ESBL NEGATIVE Sensitive     * 20,000 COLONIES/mL KLEBSIELLA PNEUMONIAE  MRSA PCR Screening     Status: None   Collection Time: 01/16/17  6:00 PM  Result Value Ref Range Status   MRSA by PCR NEGATIVE NEGATIVE Final    Comment:        The GeneXpert MRSA Assay (FDA approved for NASAL specimens only), is one component of a comprehensive MRSA colonization surveillance program. It is not intended to diagnose MRSA infection nor to guide or monitor treatment for MRSA infections.   C difficile quick scan w PCR reflex     Status: None   Collection Time: 01/16/17 11:36 PM  Result Value Ref Range Status   C Diff antigen NEGATIVE NEGATIVE Final   C Diff toxin NEGATIVE NEGATIVE Final   C Diff interpretation No C. difficile detected.  Final         Radiology Studies: No results found.      Scheduled Meds: . Chlorhexidine Gluconate Cloth  6 each Topical Daily  . ciprofloxacin  500 mg Oral BID  . feeding supplement  1 Container Oral TID BM  . multivitamin with minerals  1 tablet Oral  Daily  . rOPINIRole  0.5 mg Oral QHS  . rOPINIRole  0.5 mg Oral Once  . sodium chloride flush  10 mL Intravenous Q8H   Continuous Infusions: . sodium chloride       LOS: 7 days       Georgette Shell, MD [facility]   If 7PM-7AM, please contact night-coverage [email or web address] Password [email or web address] 01/23/2017, 11:36 AM

## 2017-01-24 ENCOUNTER — Ambulatory Visit: Payer: Medicare Other

## 2017-01-24 MED ORDER — CIPROFLOXACIN HCL 500 MG PO TABS: 250.0000 mg | ORAL_TABLET | Freq: Two times a day (BID) | ORAL | 0 refills | Status: AC

## 2017-01-24 MED ORDER — ROPINIROLE HCL 0.5 MG PO TABS: 0.5000 mg | ORAL_TABLET | Freq: Every day | ORAL | 0 refills | Status: AC

## 2017-01-24 MED ORDER — HEPARIN SOD (PORK) LOCK FLUSH 100 UNIT/ML IV SOLN
500.0000 [IU] | INTRAVENOUS | Status: DC | PRN
  Filled 2017-01-24 (×2): qty 5

## 2017-01-24 MED ORDER — CHLORHEXIDINE GLUCONATE CLOTH 2 % EX PADS: 6.0000 | MEDICATED_PAD | Freq: Every day | CUTANEOUS | 0 refills | Status: AC

## 2017-01-24 MED ORDER — ONDANSETRON HCL 4 MG PO TABS: 4.0000 mg | ORAL_TABLET | Freq: Three times a day (TID) | ORAL | 0 refills | Status: AC | PRN

## 2017-01-24 MED ORDER — ALPRAZOLAM 0.25 MG PO TABS: 0.2500 mg | ORAL_TABLET | Freq: Three times a day (TID) | ORAL | 0 refills | Status: AC | PRN

## 2017-01-24 MED ORDER — ALBUTEROL SULFATE (2.5 MG/3ML) 0.083% IN NEBU: 2.5000 mg | INHALATION_SOLUTION | RESPIRATORY_TRACT | 12 refills | Status: AC | PRN

## 2017-01-24 NOTE — Discharge Summary (Signed)
Physician Discharge Summary  Randall Little QIH:[id number] DOB: [birth year] DOA: 01/16/2017  PCP: Leonard Downing, MD  Admit date: 01/16/2017 Discharge date: 01/24/2017  Admitted From:,Disposition:   Recommendations for Outpatient Follow-up:  1. 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 Alen Blew status post 3 chemotherapy cycles and 15 radiation cycles all palliative, essential hypertension, dyslipidemia who lives at home, comes in with a few day history of lower abdominal discomfort and mild diarrhea, today he noted that his left nephrostomy tube had bloody drainage, came to the ER where workup showed sepsis, fever, ARF CT evidence of left-sided hydronephrosis and partially dislodged left nephrostomy tube. patient seen today.he is resting in bed.hehas seen palliative care,and did not want to continue radiation.  Rectal tube removed.no diarrhea overnite. Patient much more alert and talking.Plan go home with hospiceon Monday Patient complained of some nausea overnight otherwise no new complaints.  Denies diarrhea at this time.  Discharge Diagnoses:  Principal Problem:   Sepsis due to urinary tract infection ([place]) Active Problems:   Hyperlipidemia   Essential hypertension   Acute renal failure (HCC)   Hydronephrosis   Bladder tumor   Pressure injury of skin   Advance care planning   Palliative care by specialist  1-Sepsis due to partially dislodged left nephrostomy tube with hydronephrosis and gram-negative bacteremia and sepsis. nephrostomy tubes in place.Patient has klebsiella bacteremia resistant only to  ampicillin.Will DC meropenem.Start ciprofloxacin twice a day.  2. Urothelial bladder cancer. Discussed his case with his oncologist Dr. Alen Blew, all treatments are palliative, supportive care for now.  3. ARF. Due to #1 &5. lan as in #1 above along with IV fluids, renal function improving gradually.  4. Hypertension. Currently blood pressure low. Hold blood pressure medications.  5. Diarrhea. Likely due to radiation exposure. Negative C. Difficile will start using Imodium when necessary.Rectal tube DC'd.   Discharge Instructions   Allergies as of 01/24/2017      Reactions   Ativan [lorazepam]    delirium   Penicillin G Rash   Has patient had a PCN reaction causing immediate rash, facial/tongue/throat swelling, SOB or lightheadedness with hypotension: Yes Has patient had a PCN reaction causing severe rash involving mucus membranes or skin necrosis: No Has patient had a PCN reaction that required hospitalization: No Has patient had a PCN reaction occurring within the last 10 years: Yes If all of the above answers are "NO", then may proceed with Cephalosporin use.      Medication List    STOP taking these medications   loperamide 2 MG capsule Commonly known as:  IMODIUM   metoprolol succinate 50 MG 24 hr tablet Commonly known as:  TOPROL-XL     TAKE these medications   albuterol (2.5 MG/3ML) 0.083% nebulizer solution Commonly known as:  PROVENTIL Take 3 mLs (2.5 mg total) by nebulization every 4 (four) hours as needed for wheezing or shortness of breath.   ALPRAZolam 0.25 MG tablet Commonly known as:  XANAX Take 1 tablet (0.25 mg total) by mouth 3 (three) times daily as needed for anxiety.   Chlorhexidine Gluconate Cloth 2 % Pads Apply 6 each topically daily.   ciprofloxacin 500 MG tablet Commonly known as:  CIPRO Take 0.5 tablets (250 mg total) by  mouth 2 (two) times daily.   ondansetron 4 MG tablet Commonly known as:  ZOFRAN Take 1 tablet (4 mg total)  by mouth every 8 (eight) hours as needed for nausea or vomiting.   oxyCODONE 5 MG immediate release tablet Commonly known as:  Oxy IR/ROXICODONE Take 1 tablet (5 mg total) by mouth every 4 (four) hours as needed for moderate pain.   prochlorperazine 10 MG tablet Commonly known as:  COMPAZINE Take 1 tablet (10 mg total) by mouth every 6 (six) hours as needed for nausea or vomiting.   rOPINIRole 0.5 MG tablet Commonly known as:  REQUIP Take 1 tablet (0.5 mg total) by mouth at bedtime.   zolpidem 5 MG tablet Commonly known as:  AMBIEN Take 5 mg by mouth at bedtime.       Allergies  Allergen Reactions  . Ativan [Lorazepam]     delirium  . Penicillin G Rash    Has patient had a PCN reaction causing immediate rash, facial/tongue/throat swelling, SOB or lightheadedness with hypotension: Yes Has patient had a PCN reaction causing severe rash involving mucus membranes or skin necrosis: No Has patient had a PCN reaction that required hospitalization: No Has patient had a PCN reaction occurring within the last 10 years: Yes If all of the above answers are "NO", then may proceed with Cephalosporin use.    Consultations:    Procedures/Studies: Ct Abdomen Pelvis Wo Contrast  Result Date: 01/16/2017 CLINICAL DATA:  Abdominal pain, weakness. EXAM: CT ABDOMEN AND PELVIS WITHOUT CONTRAST TECHNIQUE: Multidetector CT imaging of the abdomen and pelvis was performed following the standard protocol without IV contrast. COMPARISON:  PET scan of December 14, 2016. FINDINGS: Lower chest: No acute abnormality. Hepatobiliary: No focal liver abnormality is seen. Status post cholecystectomy. No biliary dilatation. Pancreas: Unremarkable. No pancreatic ductal dilatation or surrounding inflammatory changes. Spleen: Normal in size without focal abnormality. Adrenals/Urinary Tract: Adrenal glands appear normal. Right nephrostomy catheter is well positioned within the right renal pelvis. However, the left  nephrostomy catheter appears to have been significantly withdrawn outside of the collecting system. Moderate left hydroureteronephrosis is noted. 3 mm distal right ureteral calculus is noted. Significant wall thickening of urinary bladder is noted consistent with history of bladder cancer. Stomach/Bowel: Stomach is within normal limits. Appendix appears normal. No evidence of bowel wall thickening, distention, or inflammatory changes. Vascular/Lymphatic: Aortic atherosclerosis. No enlarged abdominal or pelvic lymph nodes. Reproductive: Prostate is unremarkable. Other: No abdominal wall hernia or abnormality. No abdominopelvic ascites. Musculoskeletal: No acute or significant osseous findings. IMPRESSION: Left nephrostomy catheter appears to have significantly withdrawn outside of the collecting system of left kidney. Moderate left hydroureteronephrosis is now noted. Stable wall thickening of urinary bladder is noted consistent with history of bladder carcinoma. Right nephrostomy catheter is well positioned within right renal collecting system without hydronephrosis. Aortic atherosclerosis. Electronically Signed   By: Marijo Conception, M.D.   On: 01/16/2017 15:30   Dg Chest 2 View  Result Date: 01/16/2017 CLINICAL DATA:  Weakness, abdominal pain. Patient is being treated for bladder carcinoma. EXAM: CHEST  2 VIEW COMPARISON:  PET-CT 12/14/2016 FINDINGS: Cardiomediastinal silhouette is normal. Mediastinal contours appear intact. Tortuosity and calcific atherosclerotic disease of the aorta. There is no evidence of focal airspace consolidation, pleural effusion or pneumothorax. Streaky opacities in bilateral lung bases, right greater than left, likely represent atelectasis versus scarring. Osseous structures are without acute abnormality. Soft tissues are grossly normal. IMPRESSION: Bibasilar atelectasis versus scarring. Calcific atherosclerotic disease of the aorta. Electronically  Signed   By: Fidela Salisbury  M.D.   On: 01/16/2017 13:17   Ir US Guide Vasc Access Right  Result Date: 01/07/2017 INDICATION: History of bladder cancer. In need of durable intravenous access for chemotherapy administration. EXAM: IMPLANTED PORT A CATH PLACEMENT WITH ULTRASOUND AND FLUOROSCOPIC GUIDANCE COMPARISON:  None. MEDICATIONS: Vancomycin 1 gm IV; The antibiotic was administered within an appropriate time interval prior to skin puncture. ANESTHESIA/SEDATION: Moderate (conscious) sedation was employed during this procedure. A total of Versed 2 mg and Fentanyl 100 mcg was administered intravenously. Moderate Sedation Time: 50 minutes. The patient's level of consciousness and vital signs were monitored continuously by radiology nursing throughout the procedure under my direct supervision. CONTRAST:  None FLUOROSCOPY TIME:  18 seconds (3 mGy) COMPLICATIONS: None immediate. PROCEDURE: The procedure, risks, benefits, and alternatives were explained to the patient. Questions regarding the procedure were encouraged and answered. The patient understands and consents to the procedure. The right neck and chest were prepped with chlorhexidine in a sterile fashion, and a sterile drape was applied covering the operative field. Maximum barrier sterile technique with sterile gowns and gloves were used for the procedure. A timeout was performed prior to the initiation of the procedure. Local anesthesia was provided with 1% lidocaine with epinephrine. After creating a small venotomy incision, a micropuncture kit was utilized to access the internal jugular vein. Real-time ultrasound guidance was utilized for vascular access including the acquisition of a permanent ultrasound image documenting patency of the accessed vessel. The microwire was utilized to measure appropriate catheter length. A subcutaneous port pocket was then created along the upper chest wall utilizing a combination of sharp and blunt dissection. The pocket was irrigated with sterile  saline. A single lumen ISP power injectable port was chosen for placement. The 8 Fr catheter was tunneled from the port pocket site to the venotomy incision. The port was placed in the pocket. The external catheter was trimmed to appropriate length. At the venotomy, an 8 Fr peel-away sheath was placed over a guidewire under fluoroscopic guidance. The catheter was then placed through the sheath and the sheath was removed. Final catheter positioning was confirmed and documented with a fluoroscopic spot radiograph. The port was accessed with a Huber needle, aspirated and flushed with heparinized saline. The venotomy site was closed with an interrupted 4-0 Vicryl suture. The port pocket incision was closed with interrupted 2-0 Vicryl suture and the skin was opposed with a running subcuticular 4-0 Vicryl suture. Dermabond and Steri-strips were applied to both incisions. Dressings were placed. The patient tolerated the procedure well without immediate post procedural complication. FINDINGS: After catheter placement, the tip lies within the superior cavoatrial junction. The catheter aspirates and flushes normally and is ready for immediate use. IMPRESSION: Successful placement of a right internal jugular approach power injectable Port-A-Cath. The catheter is ready for immediate use. Electronically Signed   By: Sandi Mariscal M.D.   On: 01/07/2017 17:36   Ir Nephrostomy Exchange Left  Result Date: 01/18/2017 INDICATION: Partially dislodged left percutaneous nephrostomy tube and sepsis with moderate left hydronephrosis by CT and positioning of the nephrostomy tube within the renal parenchyma by CT. EXAM: LEFT PERCUTANEOUS NEPHROSTOMY TUBE EXCHANGE COMPARISON:  CT of the abdomen and pelvis earlier on 01/16/2017 MEDICATIONS: The patient had received IV antibiotics prior to the procedure in the Emergency Department. ANESTHESIA/SEDATION: Fentanyl 50 mcg IV; Versed 1.5 mg IV Moderate Sedation Time:  25 minutes. The patient was  continuously monitored during the procedure by the interventional radiology  nurse under my direct supervision. CONTRAST:  15 mL Isovue-300 - administered into the collecting system(s) FLUOROSCOPY TIME:  Fluoroscopy Time: 7 minutes and 12 seconds (59.1 mGy). COMPLICATIONS: None immediate. PROCEDURE: Informed written consent was obtained from the patient's wife after a thorough discussion of the procedural risks, benefits and alternatives. All questions were addressed. Maximal Sterile Barrier Technique was utilized including caps, mask, sterile gowns, sterile gloves, sterile drape, hand hygiene and skin antiseptic. A timeout was performed prior to the initiation of the procedure. The pre-existing left percutaneous nephrostomy tube was prepped. The tube was initially injected with contrast material under fluoroscopy to confirm position and opacified the collecting system. The tube was then cut and removed over a guidewire. A 5 French catheter was then advanced over the guidewire. A stiff guidewire was advanced. Ultimately, the catheter and wire were retracted and cut. The wire was removed and a new guidewire advanced through the cut catheter in order to remove the catheter. A new 10 French nephrostomy tube was then advanced over the wire and formed. The wire was removed. The catheter was flushed and connected to a gravity drainage bag. It was secured at the skin with a Prolene retention suture and StatLock device. FINDINGS: Initial imaging shows the left nephrostomy tube to lie within the renal parenchyma just outside the collecting system of the kidney. With injection of contrast, the collecting system does opacify. The nephrostomy tube was removed over a guidewire resulting in the guidewire forming a loop in the renal parenchyma conforming to the catheter course. As soon as the nephrostomy tube was removed over a guidewire, purulent appearing fluid did come out of the nephrostomy tract. This made it difficult to  advance a new catheter over the wire into the collecting system. There was difficulty in reducing the loop which ultimately became a tight kink that was retracted out of the body and cut. This then allow load exchange over a guidewire for a new nephrostomy tube which was formed at the level of the renal pelvis. Manipulation did result in hemorrhage with grossly bloody urine return after advancement of the new nephrostomy tube. The new nephrostomy tube will be irrigated with saline due to bleeding. IMPRESSION: Replacement of malposition left percutaneous nephrostomy tube. Initial catheter position was in the renal parenchyma just outside the collecting system. A loop of guidewire conforming to the catheter course had to be reduced in order to allow advancement of a new 10 French nephrostomy tube into the left renal pelvis. This manipulation did result in some bleeding in the collecting system. The new nephrostomy tube will be irrigated with saline. Electronically Signed   By: Aletta Edouard M.D.   On: 01/18/2017 09:32   Ir Fluoro Guide Port Insertion Right  Result Date: 01/07/2017 INDICATION: History of bladder cancer. In need of durable intravenous access for chemotherapy administration. EXAM: IMPLANTED PORT A CATH PLACEMENT WITH ULTRASOUND AND FLUOROSCOPIC GUIDANCE COMPARISON:  None. MEDICATIONS: Vancomycin 1 gm IV; The antibiotic was administered within an appropriate time interval prior to skin puncture. ANESTHESIA/SEDATION: Moderate (conscious) sedation was employed during this procedure. A total of Versed 2 mg and Fentanyl 100 mcg was administered intravenously. Moderate Sedation Time: 50 minutes. The patient's level of consciousness and vital signs were monitored continuously by radiology nursing throughout the procedure under my direct supervision. CONTRAST:  None FLUOROSCOPY TIME:  18 seconds (3 mGy) COMPLICATIONS: None immediate. PROCEDURE: The procedure, risks, benefits, and alternatives were  explained to the patient. Questions regarding the procedure were encouraged and  answered. The patient understands and consents to the procedure. The right neck and chest were prepped with chlorhexidine in a sterile fashion, and a sterile drape was applied covering the operative field. Maximum barrier sterile technique with sterile gowns and gloves were used for the procedure. A timeout was performed prior to the initiation of the procedure. Local anesthesia was provided with 1% lidocaine with epinephrine. After creating a small venotomy incision, a micropuncture kit was utilized to access the internal jugular vein. Real-time ultrasound guidance was utilized for vascular access including the acquisition of a permanent ultrasound image documenting patency of the accessed vessel. The microwire was utilized to measure appropriate catheter length. A subcutaneous port pocket was then created along the upper chest wall utilizing a combination of sharp and blunt dissection. The pocket was irrigated with sterile saline. A single lumen ISP power injectable port was chosen for placement. The 8 Fr catheter was tunneled from the port pocket site to the venotomy incision. The port was placed in the pocket. The external catheter was trimmed to appropriate length. At the venotomy, an 8 Fr peel-away sheath was placed over a guidewire under fluoroscopic guidance. The catheter was then placed through the sheath and the sheath was removed. Final catheter positioning was confirmed and documented with a fluoroscopic spot radiograph. The port was accessed with a Huber needle, aspirated and flushed with heparinized saline. The venotomy site was closed with an interrupted 4-0 Vicryl suture. The port pocket incision was closed with interrupted 2-0 Vicryl suture and the skin was opposed with a running subcuticular 4-0 Vicryl suture. Dermabond and Steri-strips were applied to both incisions. Dressings were placed. The patient tolerated the  procedure well without immediate post procedural complication. FINDINGS: After catheter placement, the tip lies within the superior cavoatrial junction. The catheter aspirates and flushes normally and is ready for immediate use. IMPRESSION: Successful placement of a right internal jugular approach power injectable Port-A-Cath. The catheter is ready for immediate use. Electronically Signed   By: Sandi Mariscal M.D.   On: 01/07/2017 17:36    (Echo, Carotid, EGD, Colonoscopy, ERCP)    Subjective:   Discharge Exam: Vitals:   01/23/17 2215 01/24/17 0630  BP: (!) 153/82 122/76  Pulse: 72 75  Resp: 16 16  Temp: 98.7 F (37.1 C) 98.5 F (36.9 C)  SpO2: 100% 92%   Vitals:   01/23/17 0515 01/23/17 1358 01/23/17 2215 01/24/17 0630  BP: 135/86 136/65 (!) 153/82 122/76  Pulse: 92 76 72 75  Resp: _0 Temp: 98.8 F (37.1 C) 98.3 F (36.8 C) 98.7 F (37.1 C) 98.5 F (36.9 C)  TempSrc: Oral Oral Oral Oral  SpO2: 97% 100% 100% 92%  Weight:      Height:        General: Pt is alert, awake, not in acute distress Cardiovascular: RRR, S1/S2 +, no rubs, no gallops Respiratory: CTA bilaterally, no wheezing, no rhonchi Abdominal: Soft, NT, ND, bowel sounds + Extremities: no edema, no cyanosis    The results of significant diagnostics from this hospitalization (including imaging, microbiology, ancillary and laboratory) are listed below for reference.     Microbiology: Recent Results (from the past 240 hour(s))  Blood Culture (routine x 2)     Status: Abnormal   Collection Time: 01/16/17  2:52 PM  Result Value Ref Range Status   Specimen Description BLOOD LEFT ANTECUBITAL  Final   Special Requests   Final    BOTTLES DRAWN AEROBIC AND ANAEROBIC Blood Culture  adequate volume   Culture  Setup Time   Final    GRAM NEGATIVE RODS IN BOTH AEROBIC AND ANAEROBIC BOTTLES CRITICAL VALUE NOTED.  VALUE IS CONSISTENT WITH PREVIOUSLY REPORTED AND CALLED VALUE.    Culture (A)  Final     KLEBSIELLA PNEUMONIAE SUSCEPTIBILITIES PERFORMED ON PREVIOUS CULTURE WITHIN THE LAST 5 DAYS. Performed at [facility], [place] [address] [place], [place] [address]    Report Status 01/20/2017 FINAL  Final  Blood Culture (routine x 2)     Status: Abnormal   Collection Time: 01/16/17  3:20 PM  Result Value Ref Range Status   Specimen Description BLOOD PORTA CATH  Final   Special Requests   Final    BOTTLES DRAWN AEROBIC AND ANAEROBIC Blood Culture adequate volume   Culture  Setup Time   Final    IN BOTH AEROBIC AND ANAEROBIC BOTTLES GRAM NEGATIVE RODS CRITICAL RESULT CALLED TO, READ BACK BY AND VERIFIED WITH: Nani Skillern [id number] MLM Performed at [facility], [place] [address], [place], [place] [address]    Culture KLEBSIELLA PNEUMONIAE (A)  Final   Report Status 01/20/2017 FINAL  Final   Organism ID, Bacteria KLEBSIELLA PNEUMONIAE  Final      Susceptibility   Klebsiella pneumoniae - MIC*    AMPICILLIN RESISTANT Resistant     CEFAZOLIN <=4 SENSITIVE Sensitive     CEFEPIME <=1 SENSITIVE Sensitive     CEFTAZIDIME <=1 SENSITIVE Sensitive     CEFTRIAXONE <=1 SENSITIVE Sensitive     CIPROFLOXACIN <=0.25 SENSITIVE Sensitive     GENTAMICIN <=1 SENSITIVE Sensitive     IMIPENEM <=0.25 SENSITIVE Sensitive     TRIMETH/SULFA <=20 SENSITIVE Sensitive     AMPICILLIN/SULBACTAM 4 SENSITIVE Sensitive     PIP/TAZO <=4 SENSITIVE Sensitive     Extended ESBL NEGATIVE Sensitive     * KLEBSIELLA PNEUMONIAE  Blood Culture ID Panel (Reflexed)     Status: Abnormal   Collection Time: 01/16/17  3:20 PM  Result Value Ref Range Status   Enterococcus species NOT DETECTED NOT DETECTED Final   Listeria monocytogenes NOT DETECTED NOT DETECTED Final   Staphylococcus species NOT DETECTED NOT DETECTED Final   Staphylococcus aureus NOT DETECTED NOT DETECTED Final   Streptococcus species NOT DETECTED NOT DETECTED Final   Streptococcus agalactiae NOT DETECTED NOT DETECTED Final   Streptococcus  pneumoniae NOT DETECTED NOT DETECTED Final   Streptococcus pyogenes NOT DETECTED NOT DETECTED Final   Acinetobacter baumannii NOT DETECTED NOT DETECTED Final   Enterobacteriaceae species DETECTED (A) NOT DETECTED Final    Comment: Enterobacteriaceae represent a large family of gram-negative bacteria, not a single organism. CRITICAL RESULT CALLED TO, READ BACK BY AND VERIFIED WITH: PHARMD N [place] [phone] MLM    Enterobacter cloacae complex NOT DETECTED NOT DETECTED Final   Escherichia coli NOT DETECTED NOT DETECTED Final   Klebsiella oxytoca NOT DETECTED NOT DETECTED Final   Klebsiella pneumoniae DETECTED (A) NOT DETECTED Final    Comment: CRITICAL RESULT CALLED TO, READ BACK BY AND VERIFIED WITH: PHARMD N GLOGOVAC [id number] MLM    Proteus species NOT DETECTED NOT DETECTED Final   Serratia marcescens NOT DETECTED NOT DETECTED Final   Carbapenem resistance NOT DETECTED NOT DETECTED Final   Haemophilus influenzae NOT DETECTED NOT DETECTED Final   Neisseria meningitidis NOT DETECTED NOT DETECTED Final   Pseudomonas aeruginosa NOT DETECTED NOT DETECTED Final   Candida albicans NOT DETECTED NOT DETECTED Final   Candida glabrata NOT  DETECTED NOT DETECTED Final   Candida krusei NOT DETECTED NOT DETECTED Final   Candida parapsilosis NOT DETECTED NOT DETECTED Final   Candida tropicalis NOT DETECTED NOT DETECTED Final    Comment: Performed at [facility], [place] [address] [place], [place] [address]  Urine Culture     Status: Abnormal   Collection Time: 01/16/17  6:00 PM  Result Value Ref Range Status   Specimen Description URINE, RANDOM  Final   Special Requests Immunocompromised  Final   Culture 20,000 COLONIES/mL KLEBSIELLA PNEUMONIAE (A)  Final   Report Status 01/19/2017 FINAL  Final   Organism ID, Bacteria KLEBSIELLA PNEUMONIAE (A)  Final      Susceptibility   Klebsiella pneumoniae - MIC*    AMPICILLIN RESISTANT Resistant     CEFAZOLIN <=4 SENSITIVE Sensitive      CEFTRIAXONE <=1 SENSITIVE Sensitive     CIPROFLOXACIN <=0.25 SENSITIVE Sensitive     GENTAMICIN <=1 SENSITIVE Sensitive     IMIPENEM <=0.25 SENSITIVE Sensitive     NITROFURANTOIN 32 SENSITIVE Sensitive     TRIMETH/SULFA <=20 SENSITIVE Sensitive     AMPICILLIN/SULBACTAM 4 SENSITIVE Sensitive     PIP/TAZO <=4 SENSITIVE Sensitive     Extended ESBL NEGATIVE Sensitive     * 20,000 COLONIES/mL KLEBSIELLA PNEUMONIAE  MRSA PCR Screening     Status: None   Collection Time: 01/16/17  6:00 PM  Result Value Ref Range Status   MRSA by PCR NEGATIVE NEGATIVE Final    Comment:        The GeneXpert MRSA Assay (FDA approved for NASAL specimens only), is one component of a comprehensive MRSA colonization surveillance program. It is not intended to diagnose MRSA infection nor to guide or monitor treatment for MRSA infections.   C difficile quick scan w PCR reflex     Status: None   Collection Time: 01/16/17 11:36 PM  Result Value Ref Range Status   C Diff antigen NEGATIVE NEGATIVE Final   C Diff toxin NEGATIVE NEGATIVE Final   C Diff interpretation No C. difficile detected.  Final     Labs: BNP (last 3 results) No results for input(s): BNP in the last 8760 hours. Basic Metabolic Panel:  Recent Labs Lab 01/18/17 0500 01/19/17 0359 01/20/17 0403 01/21/17 0500  NA 137 137 139 141  K 3.2* 3.4* 3.4* 3.1*  CL 110 109 110 112*  CO2 21* _0 GLUCOSE 173* 162* 142* 132*  BUN 28* 31* 33* 33*  CREATININE 1.66* 1.35* 1.29* 1.13  CALCIUM 7.4* 7.9* 8.0* 7.3*  MG  --  1.7  --   --    Liver Function Tests:  Recent Labs Lab 01/18/17 0500 01/19/17 0359 01/20/17 0403 01/21/17 0500  AST 44* 40 27 38  ALT 46 48 41 54  ALKPHOS 53 58 58 48  BILITOT 0.5 0.4 0.5 0.7  PROT 4.6* 4.7* 4.7* 4.2*  ALBUMIN 1.9* 1.9* 1.9* 1.8*   No results for input(s): LIPASE, AMYLASE in the last 168 hours. No results for input(s): AMMONIA in the last 168 hours. CBC:  Recent Labs Lab 01/18/17 0500  01/19/17 0359 01/20/17 0403 01/21/17 0500  WBC 4.1 6.0 6.2 5.3  HGB 8.0* 8.1* 8.5* 7.6*  HCT 23.2* 23.7* 24.9* 22.5*  MCV 89.6 88.4 88.0 89.3  PLT 73* 87* 113* 91*   Cardiac Enzymes: No results for input(s): CKTOTAL, CKMB, CKMBINDEX, TROPONINI in the last 168 hours. BNP: Invalid input(s): POCBNP CBG: No results for input(s): GLUCAP in the  last 168 hours. D-Dimer No results for input(s): DDIMER in the last 72 hours. Hgb A1c No results for input(s): HGBA1C in the last 72 hours. Lipid Profile No results for input(s): CHOL, HDL, LDLCALC, TRIG, CHOLHDL, LDLDIRECT in the last 72 hours. Thyroid function studies No results for input(s): TSH, T4TOTAL, T3FREE, THYROIDAB in the last 72 hours.  Invalid input(s): FREET3 Anemia work up No results for input(s): VITAMINB12, FOLATE, FERRITIN, TIBC, IRON, RETICCTPCT in the last 72 hours. Urinalysis    Component Value Date/Time   COLORURINE YELLOW 01/16/2017 1825   APPEARANCEUR HAZY (A) 01/16/2017 1825   LABSPEC 1.017 01/16/2017 1825   PHURINE 5.0 01/16/2017 1825   GLUCOSEU NEGATIVE 01/16/2017 1825   HGBUR LARGE (A) 01/16/2017 1825   BILIRUBINUR NEGATIVE 01/16/2017 1825   KETONESUR NEGATIVE 01/16/2017 1825   PROTEINUR 100 (A) 01/16/2017 1825   NITRITE NEGATIVE 01/16/2017 1825   LEUKOCYTESUR LARGE (A) 01/16/2017 1825   Sepsis Labs Invalid input(s): PROCALCITONIN,  WBC,  LACTICIDVEN Microbiology Recent Results (from the past 240 hour(s))  Blood Culture (routine x 2)     Status: Abnormal   Collection Time: 01/16/17  2:52 PM  Result Value Ref Range Status   Specimen Description BLOOD LEFT ANTECUBITAL  Final   Special Requests   Final    BOTTLES DRAWN AEROBIC AND ANAEROBIC Blood Culture adequate volume   Culture  Setup Time   Final    GRAM NEGATIVE RODS IN BOTH AEROBIC AND ANAEROBIC BOTTLES CRITICAL VALUE NOTED.  VALUE IS CONSISTENT WITH PREVIOUSLY REPORTED AND CALLED VALUE.    Culture (A)  Final    KLEBSIELLA  PNEUMONIAE SUSCEPTIBILITIES PERFORMED ON PREVIOUS CULTURE WITHIN THE LAST 5 DAYS. Performed at [facility], [place] [address] [place], [place] [address]    Report Status 01/20/2017 FINAL  Final  Blood Culture (routine x 2)     Status: Abnormal   Collection Time: 01/16/17  3:20 PM  Result Value Ref Range Status   Specimen Description BLOOD PORTA CATH  Final   Special Requests   Final    BOTTLES DRAWN AEROBIC AND ANAEROBIC Blood Culture adequate volume   Culture  Setup Time   Final    IN BOTH AEROBIC AND ANAEROBIC BOTTLES GRAM NEGATIVE RODS CRITICAL RESULT CALLED TO, READ BACK BY AND VERIFIED WITH: Nani Skillern [id number] MLM Performed at [facility], [place] [address], [place], [place] [address]    Culture KLEBSIELLA PNEUMONIAE (A)  Final   Report Status 01/20/2017 FINAL  Final   Organism ID, Bacteria KLEBSIELLA PNEUMONIAE  Final      Susceptibility   Klebsiella pneumoniae - MIC*    AMPICILLIN RESISTANT Resistant     CEFAZOLIN <=4 SENSITIVE Sensitive     CEFEPIME <=1 SENSITIVE Sensitive     CEFTAZIDIME <=1 SENSITIVE Sensitive     CEFTRIAXONE <=1 SENSITIVE Sensitive     CIPROFLOXACIN <=0.25 SENSITIVE Sensitive     GENTAMICIN <=1 SENSITIVE Sensitive     IMIPENEM <=0.25 SENSITIVE Sensitive     TRIMETH/SULFA <=20 SENSITIVE Sensitive     AMPICILLIN/SULBACTAM 4 SENSITIVE Sensitive     PIP/TAZO <=4 SENSITIVE Sensitive     Extended ESBL NEGATIVE Sensitive     * KLEBSIELLA PNEUMONIAE  Blood Culture ID Panel (Reflexed)     Status: Abnormal   Collection Time: 01/16/17  3:20 PM  Result Value Ref Range Status   Enterococcus species NOT DETECTED NOT DETECTED Final   Listeria monocytogenes NOT DETECTED NOT DETECTED Final   Staphylococcus  species NOT DETECTED NOT DETECTED Final   Staphylococcus aureus NOT DETECTED NOT DETECTED Final   Streptococcus species NOT DETECTED NOT DETECTED Final   Streptococcus agalactiae NOT DETECTED NOT DETECTED Final   Streptococcus pneumoniae NOT  DETECTED NOT DETECTED Final   Streptococcus pyogenes NOT DETECTED NOT DETECTED Final   Acinetobacter baumannii NOT DETECTED NOT DETECTED Final   Enterobacteriaceae species DETECTED (A) NOT DETECTED Final    Comment: Enterobacteriaceae represent a large family of gram-negative bacteria, not a single organism. CRITICAL RESULT CALLED TO, READ BACK BY AND VERIFIED WITH: PHARMD N Crawford [phone] MLM    Enterobacter cloacae complex NOT DETECTED NOT DETECTED Final   Escherichia coli NOT DETECTED NOT DETECTED Final   Klebsiella oxytoca NOT DETECTED NOT DETECTED Final   Klebsiella pneumoniae DETECTED (A) NOT DETECTED Final    Comment: CRITICAL RESULT CALLED TO, READ BACK BY AND VERIFIED WITH: PHARMD N GLOGOVAC [id number] MLM    Proteus species NOT DETECTED NOT DETECTED Final   Serratia marcescens NOT DETECTED NOT DETECTED Final   Carbapenem resistance NOT DETECTED NOT DETECTED Final   Haemophilus influenzae NOT DETECTED NOT DETECTED Final   Neisseria meningitidis NOT DETECTED NOT DETECTED Final   Pseudomonas aeruginosa NOT DETECTED NOT DETECTED Final   Candida albicans NOT DETECTED NOT DETECTED Final   Candida glabrata NOT DETECTED NOT DETECTED Final   Candida krusei NOT DETECTED NOT DETECTED Final   Candida parapsilosis NOT DETECTED NOT DETECTED Final   Candida tropicalis NOT DETECTED NOT DETECTED Final    Comment: Performed at [facility], [address] [place], [place] [address]  Urine Culture     Status: Abnormal   Collection Time: 01/16/17  6:00 PM  Result Value Ref Range Status   Specimen Description URINE, RANDOM  Final   Special Requests Immunocompromised  Final   Culture 20,000 COLONIES/mL KLEBSIELLA PNEUMONIAE (A)  Final   Report Status 01/19/2017 FINAL  Final   Organism ID, Bacteria KLEBSIELLA PNEUMONIAE (A)  Final      Susceptibility   Klebsiella pneumoniae - MIC*    AMPICILLIN RESISTANT Resistant     CEFAZOLIN <=4 SENSITIVE Sensitive     CEFTRIAXONE <=1  SENSITIVE Sensitive     CIPROFLOXACIN <=0.25 SENSITIVE Sensitive     GENTAMICIN <=1 SENSITIVE Sensitive     IMIPENEM <=0.25 SENSITIVE Sensitive     NITROFURANTOIN 32 SENSITIVE Sensitive     TRIMETH/SULFA <=20 SENSITIVE Sensitive     AMPICILLIN/SULBACTAM 4 SENSITIVE Sensitive     PIP/TAZO <=4 SENSITIVE Sensitive     Extended ESBL NEGATIVE Sensitive     * 20,000 COLONIES/mL KLEBSIELLA PNEUMONIAE  MRSA PCR Screening     Status: None   Collection Time: 01/16/17  6:00 PM  Result Value Ref Range Status   MRSA by PCR NEGATIVE NEGATIVE Final    Comment:        The GeneXpert MRSA Assay (FDA approved for NASAL specimens only), is one component of a comprehensive MRSA colonization surveillance program. It is not intended to diagnose MRSA infection nor to guide or monitor treatment for MRSA infections.   C difficile quick scan w PCR reflex     Status: None   Collection Time: 01/16/17 11:36 PM  Result Value Ref Range Status   C Diff antigen NEGATIVE NEGATIVE Final   C Diff toxin NEGATIVE NEGATIVE Final   C Diff interpretation No C. difficile detected.  Final     Time coordinating discharge: Over 30 minutes  SIGNED:  Georgette Shell, MD  [facility] 01/24/2017, 9:33 AM  If 7PM-7AM, please contact night-coverage [email or web address] Password [email or web address]

## 2017-01-24 NOTE — Care Management Important Message (Signed)
Important Message  Patient Details  Name: Randall Little MRN: [id number] Date of Birth: 13-Apr-1932   Medicare Important Message Given:  Yes    Kerin Salen 01/24/2017, 1:32 PMImportant Message  Patient Details  Name: Randall Little MRN: [id number] Date of Birth: November 13, 1932   Medicare Important Message Given:  Yes    Kerin Salen 01/24/2017, 1:32 PM

## 2017-01-24 NOTE — Progress Notes (Addendum)
This CM met with pt and wife at bedside to discuss disposition planning. Pt and wife confirm that plan is for home with hospice services. Choice offered and [facility] chosen. Referral called to [facility]. Wife Inez Catalina requesting 3in1.   Addendum 2:21pm :  [facility] states they do not have staffing in the pt area at this time. [facility] 2nd choice and referral called to rep.   Marney Doctor RN,BSN,NCM [phone]

## 2017-01-25 ENCOUNTER — Ambulatory Visit: Payer: Medicare Other

## 2017-01-25 ENCOUNTER — Other Ambulatory Visit: Payer: Medicare Other

## 2017-01-25 NOTE — Progress Notes (Signed)
Nutrition  Nutrition appointment was scheduled for today at [facility]. Chart reviewed and noted patient currently in the hospital.  Planning home with hospice and no further treatments at this time.  Nutrition services available if needed.   Mar Walmer B. Zenia Resides, House, [place] Registered Dietitian [phone] (pager)

## 2017-01-26 ENCOUNTER — Encounter: Payer: Self-pay | Admitting: *Deleted

## 2017-01-26 ENCOUNTER — Other Ambulatory Visit (HOSPITAL_COMMUNITY): Payer: Medicare Other

## 2017-01-26 ENCOUNTER — Ambulatory Visit: Payer: Medicare Other

## 2017-01-27 ENCOUNTER — Ambulatory Visit: Payer: Medicare Other

## 2017-01-28 ENCOUNTER — Ambulatory Visit: Payer: Medicare Other

## 2017-01-31 ENCOUNTER — Ambulatory Visit: Payer: Medicare Other

## 2017-02-01 ENCOUNTER — Other Ambulatory Visit: Payer: Medicare Other

## 2017-02-01 ENCOUNTER — Ambulatory Visit: Payer: Medicare Other

## 2017-02-02 ENCOUNTER — Ambulatory Visit: Payer: Medicare Other

## 2017-02-03 ENCOUNTER — Ambulatory Visit: Payer: Medicare Other

## 2017-02-04 ENCOUNTER — Inpatient Hospital Stay (HOSPITAL_COMMUNITY): Admission: RE | Admit: 2017-02-04 | Payer: Medicare Other | Source: Ambulatory Visit

## 2017-02-04 ENCOUNTER — Ambulatory Visit: Payer: Medicare Other

## 2017-02-06 ENCOUNTER — Ambulatory Visit: Payer: Medicare Other

## 2017-02-07 ENCOUNTER — Ambulatory Visit: Payer: Medicare Other

## 2017-02-07 NOTE — Progress Notes (Addendum)
  Radiation Oncology         [phone] ________________________________  Name: Randall Little MRN: [id number]  Date: 01/14/2017  DOB: 12-29-1932  End of Treatment Note  Diagnosis:   83 y.o. 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 However, the bladder was only treated to 27 Gy in 15 fractions because the patient opted out of further treatment due to severe weakness and failure to thrive.  Beams/energy:   3D // 15X, 6X Photon  Narrative: During his treatment he reported fatigue, persistent nausea, poor appetite, and continued intermittent burning in his groin. He stated that his bowels alternated between diarrhea and constipation and were relieved with Imodium. He denied any pain or vomiting. Collection bags from bilateral nephrostomy tubes with clear yellow drainage and no blood or sediment were noted. However on 01/16/2017 he went to the ED complaining of lower abdominal pain with gross blood in nephrostomy tube. Workup indicated sepsis, fever, ARF and left nephrostomy tube partially dislodge on CT imaging.  Dr. Alen Blew felt that holding the patient's chemotherapy and radiation at that time was appropriate, and the patient continued to refuse radiation therapy everyday thereafter, discharging home with hospice care.  Plan: The patient has declined further radiation treatments and discharged home from the hospital with Hospice care on 01/24/17. The patient will followup on an as needed basis. I advised him to call or return sooner if he has any questions or concerns related to his recovery or treatment. ________________________________  Sheral Apley. Tammi Klippel, M.D.  This  document serves as a record of services personally performed by Tyler Pita, MD. It was created on his behalf by Rae Lips, a trained medical scribe. The creation of this record is based on the scribe's personal observations and the provider's statements to them. This document has been checked and approved by the attending provider.

## 2017-02-08 ENCOUNTER — Ambulatory Visit: Payer: Medicare Other

## 2017-02-09 ENCOUNTER — Ambulatory Visit: Payer: Medicare Other

## 2017-02-10 ENCOUNTER — Ambulatory Visit: Payer: Medicare Other

## 2017-02-11 ENCOUNTER — Ambulatory Visit: Payer: Medicare Other

## 2017-02-14 ENCOUNTER — Ambulatory Visit: Payer: Medicare Other

## 2017-02-15 ENCOUNTER — Ambulatory Visit: Payer: Medicare Other

## 2017-02-16 ENCOUNTER — Ambulatory Visit: Payer: Medicare Other

## 2017-02-17 ENCOUNTER — Ambulatory Visit: Payer: Medicare Other

## 2017-03-05 DEATH — deceased

## 2018-10-02 IMAGING — CT CT EXTREM UP ENTIRE ARM*L* W/ CM
1 of 7 series · 2 of 14 positions shown, 3 images · IV contrast (65CC ISOVUE 300)
Comparison: Doppler ultrasound 02/06/2016

CLINICAL DATA: Left arm pain, swelling and erythema for 3 weeks.
Symptoms since phlebotomy. No previous relevant surgery. Creatinine
Ave.Results: Creatinine 1.5 mg/dL.

EXAM:
CT OF THE UPPER LEFT ARM WITH CONTRAST
TECHNIQUE: Multi detector CT imaging of the left upper extremity was performed
from the shoulder through the wrist following intravenous contrast
administration. Multiplanar reformatted images generated.
CONTRAST:  65 ml 5sovue-RNN. Contrast volume reduced due to renal
insufficiency.

[Series 5: shoulder soft · axial · 0.48mm/px · z∈[-436,-212]mm · 2 of 270 slices shown, 3 images]
[im 90/270  soft-tissue]
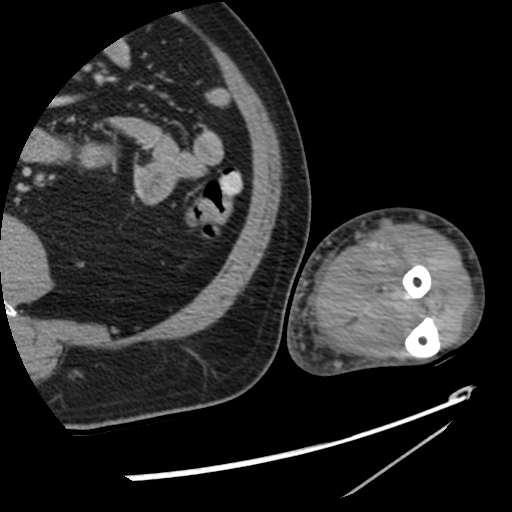
[im 90/270  bone]
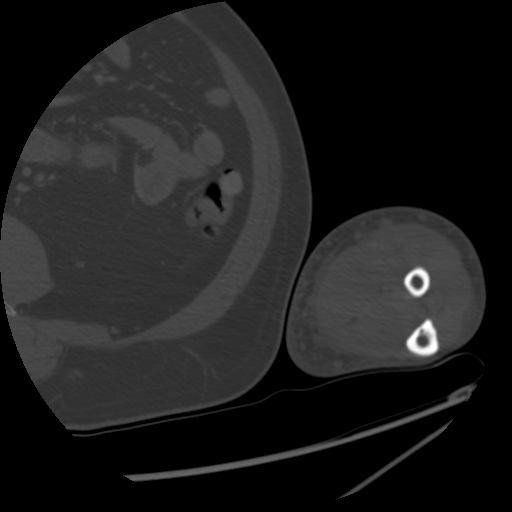
[im 180/270  bone]
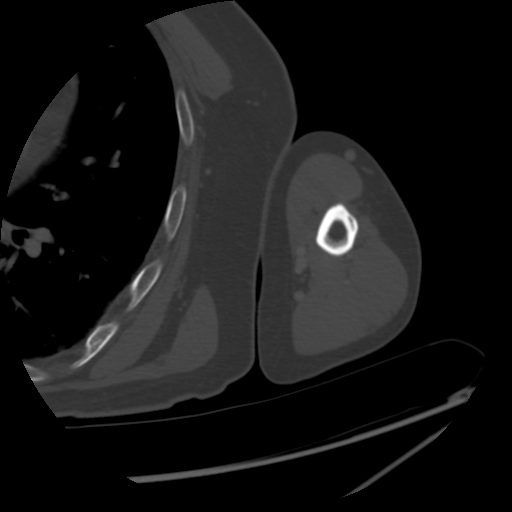

[2 of 14 positions shown; findings below may reference images not displayed]

FINDINGS: Bones/Joint/Cartilage

Peripherally sclerotic lesion in the left humeral head measures
x 3.0 x 2.2 cm, favored to reflect an old bone infarct. Atypical
chondroid lesion consider less likely. There is no associated
endosteal scalloping or pathologic fracture. There is no worrisome
osseous finding. No evidence of acute fracture or dislocation. No
significant arthropathic changes or effusions noted at the left
shoulder, elbow or wrist.

Ligaments

Not relevant for exam/indication.

Muscles and Tendons
No focal muscular abnormalities are seen. As evaluated by CT, the
elbow tendons appear grossly intact.

Soft Tissues
There is subcutaneous edema throughout the forearm, extending into
the distal upper arm. No focal fluid collections are identified. No
foreign body, soft tissue emphysema or definite vascular
abnormality. Fact containing left inguinal hernia noted
incidentally.
IMPRESSION: 1. Nonspecific subcutaneous edema in the left forearm extending into
the distal upper arm, most likely representing cellulitis. Correlate
clinically.
2. No evidence of soft tissue abscess, DVT or superficial
thrombophlebitis.
3. No evidence of joint effusion or osteomyelitis.
4. Probable incidental bone infarct in the proximal left humerus.

## 2019-07-22 IMAGING — XA IR EXCHANGE NEPHROSTOMY RIGHT
1 series · 5 of 5 positions shown · non-contrast
Comparison: 10/23/2016

INDICATION: Bladder carcinoma and indwelling bilateral percutaneous nephrostomy
tubes originally placed on 10/23/2016. Exchange of the nephrostomy
tubes now performed.

EXAM:
BILATERAL PERCUTANEOUS NEPHROSTOMY TUBE EXCHANGE

[Series 300: tube placements · 5 of 5 slices shown]
[im 1/5]
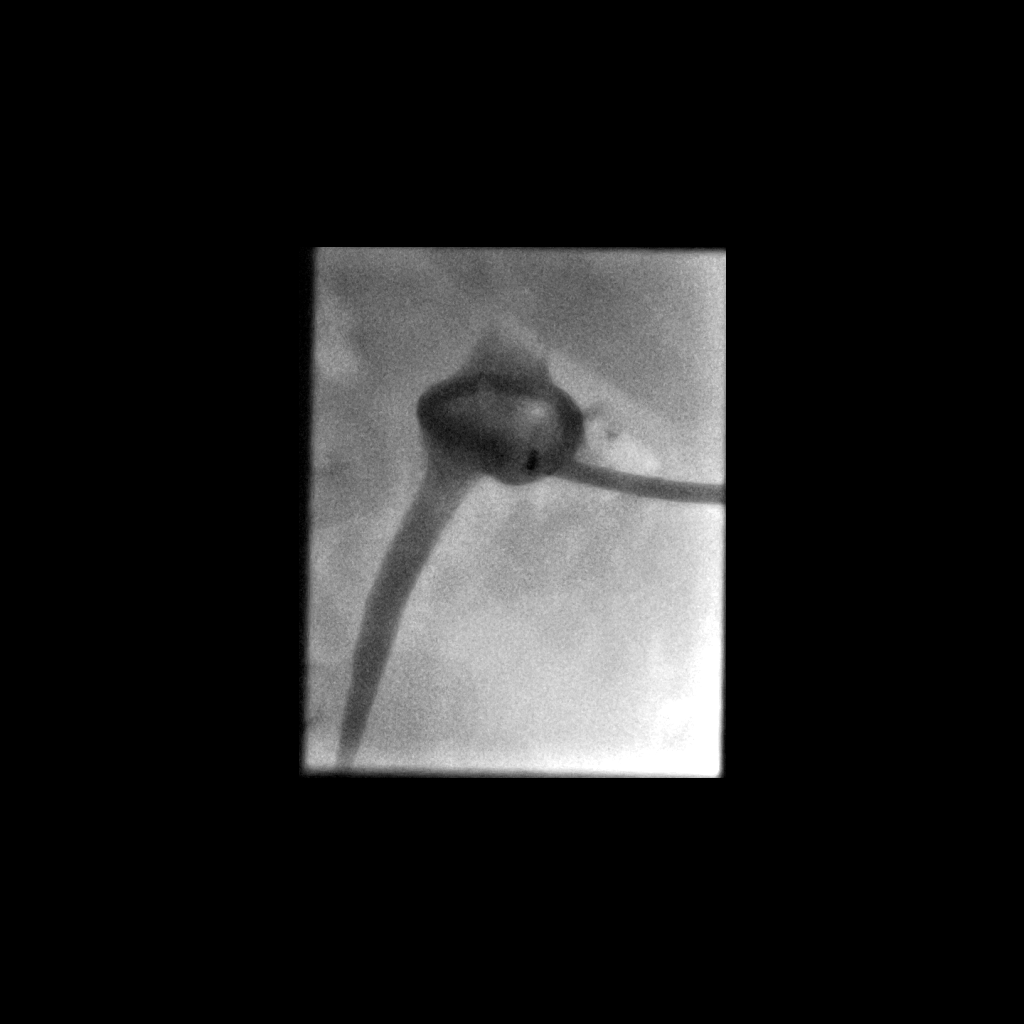
[im 2/5]
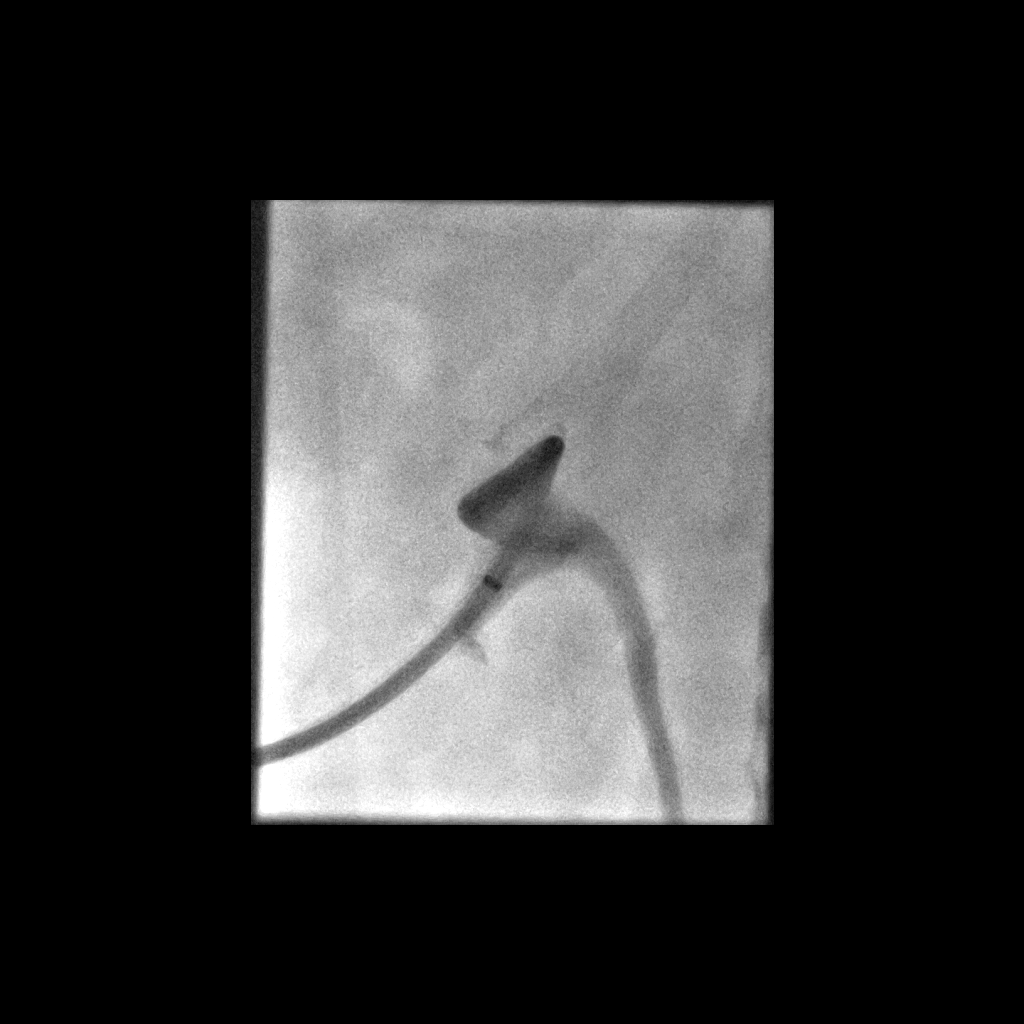
[im 3/5]
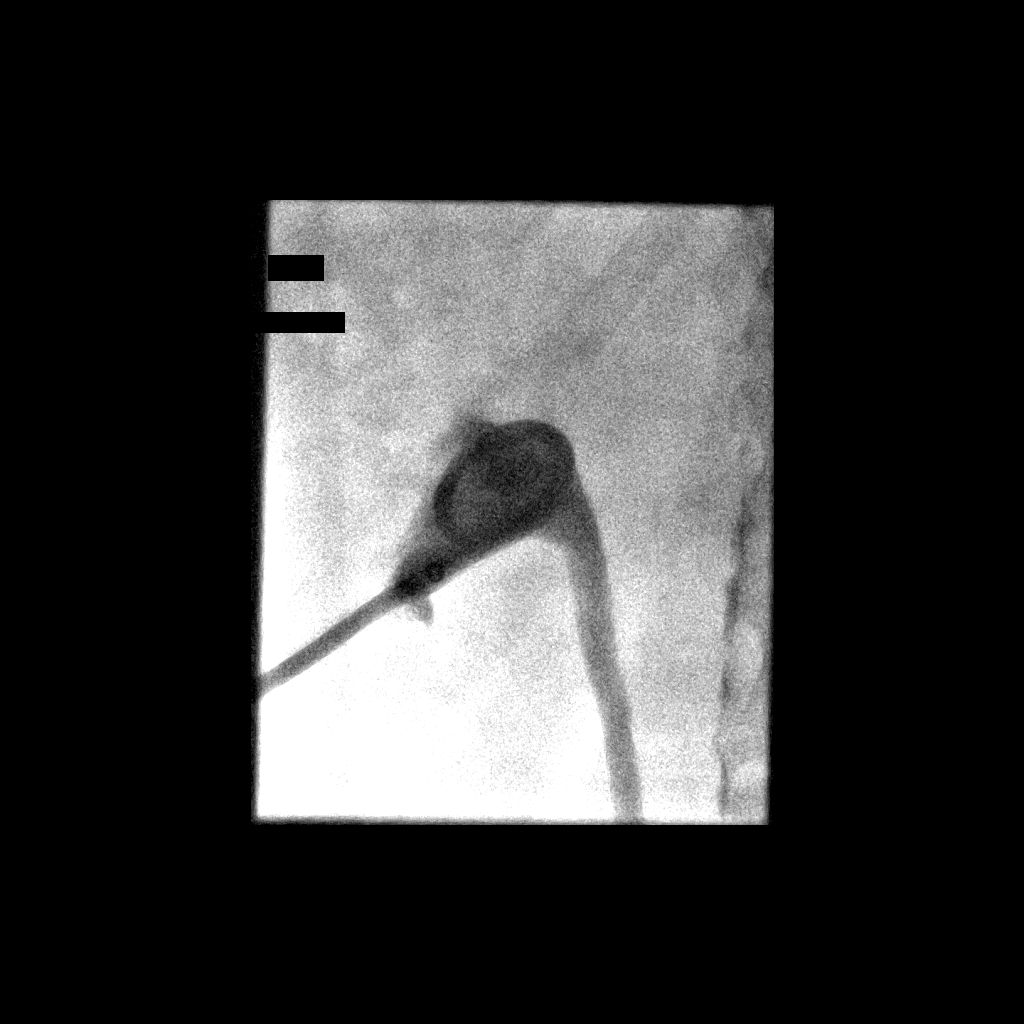
[im 4/5]
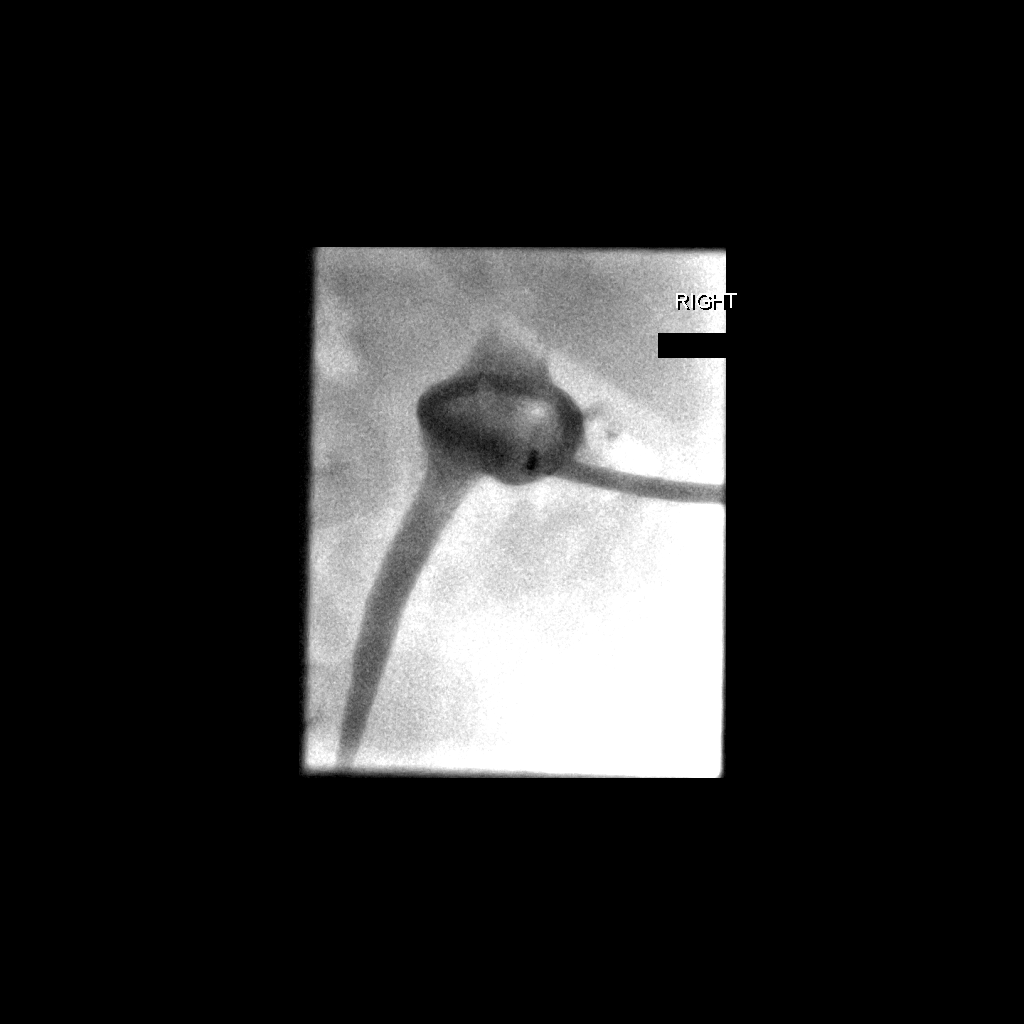
[im 5/5]
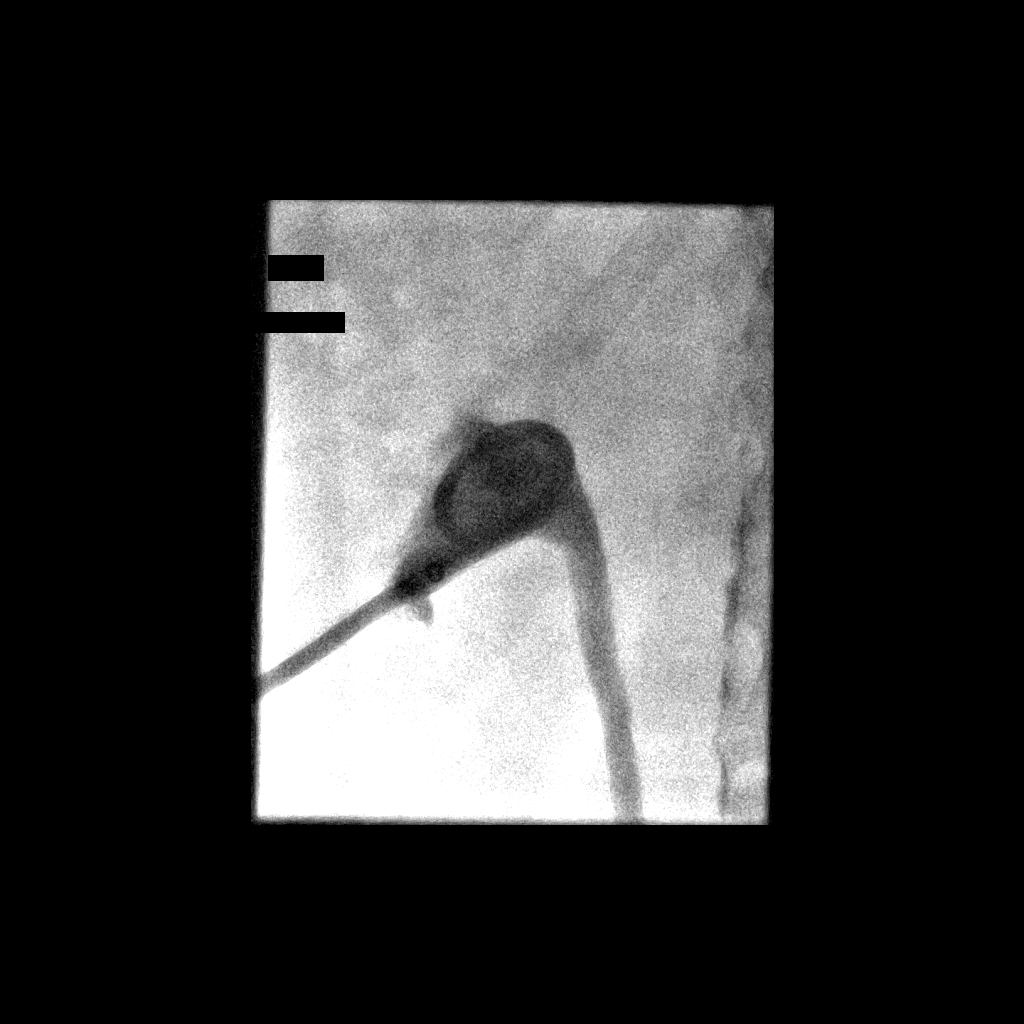

[5 of 5 positions shown; findings below may reference images not displayed]

MEDICATIONS:
None

ANESTHESIA/SEDATION:
None

CONTRAST:  20 mL Psovue-755 - administered into the collecting
system(s)

FLUOROSCOPY TIME:  Fluoroscopy Time: 1 minute and 18 seconds. 14
mGy.

COMPLICATIONS:
None immediate.

PROCEDURE:
Informed written consent was obtained from the patient after a
thorough discussion of the procedural risks, benefits and
alternatives. All questions were addressed. Maximal Sterile Barrier
Technique was utilized including caps, mask, sterile gowns, sterile
gloves, sterile drape, hand hygiene and skin antiseptic. A timeout
was performed prior to the initiation of the procedure.

Both nephrostomy tubes were injected with contrast material.
Retention sutures were cut. The tubes were removed over guidewires.
New 10 French percutaneous nephrostomy tubes were then advanced over
wires and formed. Contrast was injected and fluoroscopic spot images
obtained to confirm catheter position. Both tubes were then
connected to new gravity drainage bags.
FINDINGS: Nephrostomy tubes were formed at the level of the renal pelvis
bilaterally. Percutaneous tracts appear well formed and retention
sutures were not placed after exchange of the tubes.
IMPRESSION: Exchange of bilateral indwelling 10 French percutaneous nephrostomy
tubes. An appointment will be made for routine tube change in 8
weeks.
